# Patient Record
Sex: Male | Born: 1950 | Race: White | Hispanic: No | Marital: Married | Smoking: Former smoker
Health system: Southern US, Community
[De-identification: ages and names within clinical notes are randomized; demographics above are authoritative.]

## PROBLEM LIST (undated history)

## (undated) DIAGNOSIS — I10 Essential (primary) hypertension: Secondary | ICD-10-CM

## (undated) DIAGNOSIS — E785 Hyperlipidemia, unspecified: Secondary | ICD-10-CM

## (undated) DIAGNOSIS — E119 Type 2 diabetes mellitus without complications: Secondary | ICD-10-CM

## (undated) DIAGNOSIS — K4090 Unilateral inguinal hernia, without obstruction or gangrene, not specified as recurrent: Secondary | ICD-10-CM

## (undated) DIAGNOSIS — K5790 Diverticulosis of intestine, part unspecified, without perforation or abscess without bleeding: Secondary | ICD-10-CM

## (undated) DIAGNOSIS — I519 Heart disease, unspecified: Secondary | ICD-10-CM

## (undated) DIAGNOSIS — K429 Umbilical hernia without obstruction or gangrene: Secondary | ICD-10-CM

## (undated) HISTORY — DX: Umbilical hernia without obstruction or gangrene: K42.9

## (undated) HISTORY — DX: Hyperlipidemia, unspecified: E78.5

## (undated) HISTORY — DX: Type 2 diabetes mellitus without complications: E11.9

## (undated) HISTORY — DX: Heart disease, unspecified: I51.9

## (undated) HISTORY — PX: HERNIA REPAIR: SHX51

## (undated) HISTORY — DX: Diverticulosis of intestine, part unspecified, without perforation or abscess without bleeding: K57.90

## (undated) HISTORY — DX: Unilateral inguinal hernia, without obstruction or gangrene, not specified as recurrent: K40.90

## (undated) HISTORY — PX: TONSILLECTOMY: SUR1361

## (undated) HISTORY — DX: Essential (primary) hypertension: I10

---

## 2002-10-24 DIAGNOSIS — K5790 Diverticulosis of intestine, part unspecified, without perforation or abscess without bleeding: Secondary | ICD-10-CM

## 2002-10-24 HISTORY — PX: COLONOSCOPY: SHX174

## 2002-10-24 HISTORY — DX: Diverticulosis of intestine, part unspecified, without perforation or abscess without bleeding: K57.90

## 2007-02-08 DIAGNOSIS — I10 Essential (primary) hypertension: Secondary | ICD-10-CM | POA: Insufficient documentation

## 2007-02-08 DIAGNOSIS — M545 Low back pain, unspecified: Secondary | ICD-10-CM | POA: Insufficient documentation

## 2007-08-17 ENCOUNTER — Ambulatory Visit: Payer: Self-pay | Admitting: Family Medicine

## 2007-08-29 ENCOUNTER — Ambulatory Visit: Payer: Self-pay | Admitting: Family Medicine

## 2009-05-13 ENCOUNTER — Ambulatory Visit: Payer: Self-pay | Admitting: Family Medicine

## 2010-12-27 ENCOUNTER — Ambulatory Visit: Payer: Self-pay | Admitting: Family Medicine

## 2011-01-23 ENCOUNTER — Ambulatory Visit: Payer: Self-pay | Admitting: Family Medicine

## 2011-02-22 ENCOUNTER — Ambulatory Visit: Payer: Self-pay | Admitting: Family Medicine

## 2013-10-24 DIAGNOSIS — K429 Umbilical hernia without obstruction or gangrene: Secondary | ICD-10-CM

## 2013-10-24 DIAGNOSIS — K4090 Unilateral inguinal hernia, without obstruction or gangrene, not specified as recurrent: Secondary | ICD-10-CM

## 2013-10-24 HISTORY — DX: Umbilical hernia without obstruction or gangrene: K42.9

## 2013-10-24 HISTORY — DX: Unilateral inguinal hernia, without obstruction or gangrene, not specified as recurrent: K40.90

## 2013-11-26 ENCOUNTER — Encounter: Payer: Self-pay | Admitting: *Deleted

## 2013-12-03 ENCOUNTER — Other Ambulatory Visit: Payer: Self-pay | Admitting: *Deleted

## 2013-12-03 ENCOUNTER — Ambulatory Visit (INDEPENDENT_AMBULATORY_CARE_PROVIDER_SITE_OTHER): Payer: PRIVATE HEALTH INSURANCE | Admitting: General Surgery

## 2013-12-03 ENCOUNTER — Encounter: Payer: Self-pay | Admitting: General Surgery

## 2013-12-03 ENCOUNTER — Encounter: Payer: Self-pay | Admitting: *Deleted

## 2013-12-03 VITALS — BP 130/78 | HR 80 | Resp 12 | Ht 68.0 in | Wt 195.0 lb

## 2013-12-03 DIAGNOSIS — Z1211 Encounter for screening for malignant neoplasm of colon: Secondary | ICD-10-CM

## 2013-12-03 DIAGNOSIS — K429 Umbilical hernia without obstruction or gangrene: Secondary | ICD-10-CM | POA: Insufficient documentation

## 2013-12-03 DIAGNOSIS — K4090 Unilateral inguinal hernia, without obstruction or gangrene, not specified as recurrent: Secondary | ICD-10-CM | POA: Insufficient documentation

## 2013-12-03 MED ORDER — POLYETHYLENE GLYCOL 3350 17 GM/SCOOP PO POWD: ORAL | Status: DC

## 2013-12-03 NOTE — Progress Notes (Signed)
Patient ID: Maurice Fischer, male   DOB: [birth year], 63 y.o.   MRN: [id number]  Patient's colonoscopy has been scheduled for 01-07-14 at [facility]. This patient has been asked to discontinue fish oil one week prior to procedure. He has also been asked to hold metformin day of colonoscopy prep and procedure. It is okay for patient to continue 81 mg aspirin.   This patient was instructed to call the office should he have further questions.   Miralax prescription had been sent to the patient's pharmacy.

## 2013-12-03 NOTE — Patient Instructions (Addendum)
Patient to be scheduled for a screening colonoscopy. Patient will contact our office when he is ready to schedule his hernia repairs.   Colonoscopy A colonoscopy is an exam to look at the entire large intestine (colon). This exam can help find problems such as tumors, polyps, inflammation, and areas of bleeding. The exam takes about 1 hour.  LET [facility] CARE PROVIDER KNOW ABOUT:   Any allergies you have.  All medicines you are taking, including vitamins, herbs, eye drops, creams, and over-the-counter medicines.  Previous problems you or members of your family have had with the use of anesthetics.  Any blood disorders you have.  Previous surgeries you have had.  Medical conditions you have. RISKS AND COMPLICATIONS  Generally, this is a safe procedure. However, as with any procedure, complications can occur. Possible complications include:  Bleeding.  Tearing or rupture of the colon wall.  Reaction to medicines given during the exam.  Infection (rare). BEFORE THE PROCEDURE   Ask your health care provider about changing or stopping your regular medicines.  You may be prescribed an oral bowel prep. This involves drinking a large amount of medicated liquid, starting the day before your procedure. The liquid will cause you to have multiple loose stools until your stool is almost clear or light green. This cleans out your colon in preparation for the procedure.  Do not eat or drink anything else once you have started the bowel prep, unless your health care provider tells you it is safe to do so.  Arrange for someone to drive you home after the procedure. PROCEDURE   You will be given medicine to help you relax (sedative).  You will lie on your side with your knees bent.  A long, flexible tube with a light and camera on the end (colonoscope) will be inserted through the rectum and into the colon. The camera sends video back to a computer screen as it moves through the colon. The  colonoscope also releases carbon dioxide gas to inflate the colon. This helps your health care provider see the area better.  During the exam, your health care provider may take a small tissue sample (biopsy) to be examined under a microscope if any abnormalities are found.  The exam is finished when the entire colon has been viewed. AFTER THE PROCEDURE   Do not drive for 24 hours after the exam.  You may have a small amount of blood in your stool.  You may pass moderate amounts of gas and have mild abdominal cramping or bloating. This is caused by the gas used to inflate your colon during the exam.  Ask when your test results will be ready and how you will get your results. Make sure you get your test results. Document Released: 10/07/2000 Document Revised: 07/31/2013 Document Reviewed: 06/17/2013 [facility] Patient Information 2014 [place]. Hernia A hernia occurs when an internal organ pushes out through a weak spot in the abdominal wall. Hernias most commonly occur in the groin and around the navel. Hernias often can be pushed back into place (reduced). Most hernias tend to get worse over time. Some abdominal hernias can get stuck in the opening (irreducible or incarcerated hernia) and cannot be reduced. An irreducible abdominal hernia which is tightly squeezed into the opening is at risk for impaired blood supply (strangulated hernia). A strangulated hernia is a medical emergency. Because of the risk for an irreducible or strangulated hernia, surgery may be recommended to repair a hernia. CAUSES  Heavy lifting.  Prolonged coughing. Straining to have a bowel movement. A cut (incision) made during an abdominal surgery. HOME CARE INSTRUCTIONS  Bed rest is not required. You may continue your normal activities. Avoid lifting more than 10 pounds (4.5 kg) or straining. Cough gently. If you are a smoker it is best to stop. Even the best hernia repair can break down with the continual  strain of coughing. Even if you do not have your hernia repaired, a cough will continue to aggravate the problem. Do not wear anything tight over your hernia. Do not try to keep it in with an outside bandage or truss. These can damage abdominal contents if they are trapped within the hernia sac. Eat a normal diet. Avoid constipation. Straining over long periods of time will increase hernia size and encourage breakdown of repairs. If you cannot do this with diet alone, stool softeners may be used. SEEK IMMEDIATE MEDICAL CARE IF:  You have a fever. You develop increasing abdominal pain. You feel nauseous or vomit. Your hernia is stuck outside the abdomen, looks discolored, feels hard, or is tender. You have any changes in your bowel habits or in the hernia that are unusual for you. You have increased pain or swelling around the hernia. You cannot push the hernia back in place by applying gentle pressure while lying down. MAKE SURE YOU:  Understand these instructions. Will watch your condition. Will get help right away if you are not doing well or get worse. Document Released: 10/10/2005 Document Revised: 01/02/2012 Document Reviewed: 05/29/2008 [facility] Patient Information 2014 [place].

## 2013-12-03 NOTE — Progress Notes (Signed)
Patient ID: Maurice Fischer, male   DOB: May 24, 1951, 63 y.o.   MRN: [id number]  Chief Complaint  Patient presents with  . Other    Evaluation of screening colonoscopy    HPI Maurice Fischer is a 63 y.o. male who presents for an evaluation of a screening colonoscopy. The patient states he was reported to have had blood in his stool at his last visit to his doctors office. He believes this was due to something he had eaten. No complaints with his bowels at this time. The patient states he has had a colonoscopy in 2004 showing diverticulosis. He also has noted a hernia in right groin and umbilicus. HPI  Past Medical History  Diagnosis Date  . Diabetes mellitus without complication   . Hyperlipidemia   . Heart disease   . Diverticulosis 2004  . Hypertension     Past Surgical History  Procedure Laterality Date  . Hernia repair  1959?  Maurice Fischer Tonsillectomy  1957?  Maurice Fischer Colonoscopy  2004    Family History  Problem Relation Age of Onset  . Cancer Father     prostate    Social History History  Substance Use Topics  . Smoking status: Former Smoker    Types: Cigarettes  . Smokeless tobacco: Never Used  . Alcohol Use: Yes    No Known Allergies  Current Outpatient Prescriptions  Medication Sig Dispense Refill  . aspirin 81 MG tablet Take 81 mg by mouth daily.      Maurice Fischer atenolol (TENORMIN) 50 MG tablet Take 50 mg by mouth daily.      Maurice Fischer atorvastatin (LIPITOR) 40 MG tablet Take 40 mg by mouth daily.      . Cholecalciferol (VITAMIN D3) 2000 UNITS TABS Take 1 tablet by mouth 2 (two) times daily.      . Coenzyme Q10 (CO Q 10 PO) Take 50 mg by mouth daily.      . fenofibrate (TRICOR) 145 MG tablet Take 145 mg by mouth daily.      . metFORMIN (GLUCOPHAGE) 1000 MG tablet Take 1,000 mg by mouth 2 (two) times daily with a meal.      . Multiple Vitamin (MULTIVITAMIN) tablet Take 1 tablet by mouth daily.      . Omega-3 Fatty Acids (FISH OIL) 1200 MG CAPS Take 1 capsule by mouth 4 (four) times  daily.      . quinapril (ACCUPRIL) 20 MG tablet Take 20 mg by mouth daily.       No current facility-administered medications for this visit.    Review of Systems Review of Systems  Constitutional: Negative.   Respiratory: Negative.   Cardiovascular: Negative.   Gastrointestinal: Negative.     Blood pressure 130/78, pulse 80, resp. rate 12, height 5\' 8"  (1.727 m), weight 195 lb (88.451 kg).  Physical Exam Physical Exam  Constitutional: He is oriented to person, place, and time. He appears well-developed and well-nourished.  Neck: Neck supple. No thyromegaly present.  Cardiovascular: Normal rate, regular rhythm and normal heart sounds.   No murmur heard. Pulmonary/Chest: Effort normal and breath sounds normal.  Abdominal: Soft. Normal appearance and bowel sounds are normal. There is no hepatosplenomegaly. A hernia (small umbilical hernia with mild tenderness) is present. Hernia confirmed positive in the right inguinal area (medium in size reducible. ).  Lymphadenopathy:    He has no cervical adenopathy.  Neurological: He is alert and oriented to person, place, and time.  Skin: Skin is warm and dry.  Data Reviewed     Assessment    Needs screening colonoscopy. Also has umbilical and right ing hernias.     Plan    Discussed colonoscopy, lap repair of hernias. He is agreeable.        Maurice Fischer 12/03/2013, 11:10 AM

## 2013-12-12 ENCOUNTER — Encounter: Payer: Self-pay | Admitting: *Deleted

## 2013-12-12 NOTE — Progress Notes (Signed)
Patient ID: Maurice Fischer, male   DOB: August 15, 1951, 63 y.o.   MRN: [id number]  Patient stopped by the office wanting to go ahead and arrange hernia surgery. This patient's surgery has been scheduled for 01-13-14 at [facility]. It is okay for patient to continue 81 mg aspirin. He is aware of instructions and will call the office if he has further questions.

## 2014-01-02 ENCOUNTER — Other Ambulatory Visit: Payer: Self-pay | Admitting: General Surgery

## 2014-01-02 DIAGNOSIS — K4090 Unilateral inguinal hernia, without obstruction or gangrene, not specified as recurrent: Secondary | ICD-10-CM

## 2014-01-02 DIAGNOSIS — Z1211 Encounter for screening for malignant neoplasm of colon: Secondary | ICD-10-CM

## 2014-01-06 ENCOUNTER — Ambulatory Visit: Payer: Self-pay | Admitting: General Surgery

## 2014-01-06 LAB — CBC WITH DIFFERENTIAL/PLATELET
BASOS PCT: 0.8 %
Basophil #: 0.1 10*3/uL (ref 0.0–0.1)
EOS PCT: 3.4 %
Eosinophil #: 0.3 10*3/uL (ref 0.0–0.7)
HCT: 43.7 % (ref 40.0–52.0)
HGB: 14.2 g/dL (ref 13.0–18.0)
LYMPHS PCT: 34.4 %
Lymphocyte #: 3.2 10*3/uL (ref 1.0–3.6)
MCH: 28.7 pg (ref 26.0–34.0)
MCHC: 32.5 g/dL (ref 32.0–36.0)
MCV: 88 fL (ref 80–100)
MONO ABS: 0.6 x10 3/mm (ref 0.2–1.0)
MONOS PCT: 6.6 %
NEUTROS ABS: 5.1 10*3/uL (ref 1.4–6.5)
Neutrophil %: 54.8 %
PLATELETS: 242 10*3/uL (ref 150–440)
RBC: 4.95 10*6/uL (ref 4.40–5.90)
RDW: 12.7 % (ref 11.5–14.5)
WBC: 9.3 10*3/uL (ref 3.8–10.6)

## 2014-01-06 LAB — BASIC METABOLIC PANEL
Anion Gap: 2 — ABNORMAL LOW (ref 7–16)
BUN: 17 mg/dL (ref 7–18)
CALCIUM: 9.3 mg/dL (ref 8.5–10.1)
CO2: 31 mmol/L (ref 21–32)
Chloride: 103 mmol/L (ref 98–107)
Creatinine: 0.74 mg/dL (ref 0.60–1.30)
EGFR (African American): >60
EGFR (Non-African Amer.): >60
GLUCOSE: 121 mg/dL — AB (ref 65–99)
OSMOLALITY: 275 (ref 275–301)
POTASSIUM: 4.1 mmol/L (ref 3.5–5.1)
Sodium: 136 mmol/L (ref 136–145)

## 2014-01-07 ENCOUNTER — Ambulatory Visit: Payer: Self-pay | Admitting: General Surgery

## 2014-01-07 DIAGNOSIS — Z1211 Encounter for screening for malignant neoplasm of colon: Secondary | ICD-10-CM

## 2014-01-08 ENCOUNTER — Encounter: Payer: Self-pay | Admitting: General Surgery

## 2014-01-09 ENCOUNTER — Encounter: Payer: Self-pay | Admitting: General Surgery

## 2014-01-13 ENCOUNTER — Ambulatory Visit: Payer: Self-pay | Admitting: General Surgery

## 2014-01-13 DIAGNOSIS — K4090 Unilateral inguinal hernia, without obstruction or gangrene, not specified as recurrent: Secondary | ICD-10-CM

## 2014-01-13 DIAGNOSIS — K429 Umbilical hernia without obstruction or gangrene: Secondary | ICD-10-CM

## 2014-01-13 HISTORY — PX: HERNIA REPAIR: SHX51

## 2014-01-14 ENCOUNTER — Encounter: Payer: Self-pay | Admitting: General Surgery

## 2014-01-16 ENCOUNTER — Telehealth: Payer: Self-pay | Admitting: *Deleted

## 2014-01-16 NOTE — Telephone Encounter (Signed)
Wife called and states that he was having several loose stools a day. I asked how much colace he was taking and she said about 2-3 a day. Advised to reduce to one a day and if he is not taking a lot of the pain medications he may not even need the colace. Advised to use Imodium sparingly as it may constipate him, wife agrees.

## 2014-01-22 ENCOUNTER — Encounter: Payer: Self-pay | Admitting: General Surgery

## 2014-01-22 ENCOUNTER — Ambulatory Visit (INDEPENDENT_AMBULATORY_CARE_PROVIDER_SITE_OTHER): Payer: Self-pay | Admitting: General Surgery

## 2014-01-22 VITALS — BP 130/72 | HR 70 | Resp 12 | Ht 68.0 in | Wt 190.0 lb

## 2014-01-22 DIAGNOSIS — K429 Umbilical hernia without obstruction or gangrene: Secondary | ICD-10-CM

## 2014-01-22 DIAGNOSIS — K4090 Unilateral inguinal hernia, without obstruction or gangrene, not specified as recurrent: Secondary | ICD-10-CM

## 2014-01-22 NOTE — Progress Notes (Signed)
Patient ID: Maurice Fischer, male   DOB: [birth year], 63 y.o.   MRN: [id number]  The patient presents for a post op lap right inguinal hernia and umbilical hernia repair. The procedure was performed on 01/13/14. The patient denies any problems at this time. Doing well.   Port sites healing well. No signs of infection. Hernia repairs intact. Abdomen is soft and nontender.

## 2014-01-22 NOTE — Patient Instructions (Addendum)
Patient to return in 1 month for follow up. The patient is aware to call back for any questions or concerns. Patient can increase activity as tolerated starting next week.

## 2014-02-24 ENCOUNTER — Encounter: Payer: Self-pay | Admitting: General Surgery

## 2014-02-24 ENCOUNTER — Ambulatory Visit (INDEPENDENT_AMBULATORY_CARE_PROVIDER_SITE_OTHER): Payer: Self-pay | Admitting: General Surgery

## 2014-02-24 VITALS — BP 120/70 | HR 78 | Resp 12 | Ht 68.0 in | Wt 189.0 lb

## 2014-02-24 DIAGNOSIS — K4090 Unilateral inguinal hernia, without obstruction or gangrene, not specified as recurrent: Secondary | ICD-10-CM

## 2014-02-24 DIAGNOSIS — K429 Umbilical hernia without obstruction or gangrene: Secondary | ICD-10-CM

## 2014-02-24 NOTE — Patient Instructions (Addendum)
Patient to return as needed. No restrictions of activity

## 2014-02-24 NOTE — Progress Notes (Signed)
The patient presents for a post op lap right inguinal hernia and umbilical hernia repair. The procedure was performed on 01/13/14. The patient denies any problems at this time. Doing well.  Port sites well healed. No signs of infection. Hernia repairs intact. Abdomen is soft and nontender

## 2014-02-25 ENCOUNTER — Encounter: Payer: Self-pay | Admitting: General Surgery

## 2015-02-14 NOTE — Op Note (Signed)
PATIENT NAME:  Maurice Fischer, Maurice Fischer MR#:  [id number] DATE OF BIRTH:  04-02-1951  DATE OF PROCEDURE:  01/13/2014  PREOPERATIVE DIAGNOSES: 1.  Right inguinal hernia.  2.  Small umbilical hernia.   POSTOPERATIVE DIAGNOSES:    1.  Right inguinal hernia.  2.  Small umbilical hernia.   PROCEDURE PERFORMED: Laparoscopy and repair of right inguinal hernia and repair of umbilical hernia.   SURGEON: Mckinley Jewel, M.D.   ANESTHESIA: General.   COMPLICATIONS: None.   ESTIMATED BLOOD LOSS: Minimal.   DRAINS: None.   DESCRIPTION OF PROCEDURE: The patient was put to sleep in the supine position on the operating table. Foley catheter was inserted which was removed at the end of the procedure. The abdomen was prepped and draped out as a sterile field. Timeout was performed. The umbilical hernia was located at the upper lip of the umbilicus. Small transverse incision was made at this site and preperitoneal fatty tissue protruding right under the skin was identified. This was easily dissected down to the fascial opening. It was just a little less than a fingerbreadth in size. The preperitoneal tissue was excised out and the peritoneal cavity was easily entered into through this fascial opening. A 12 mm XL port was placed and pneumoperitoneum was obtained. The camera was introduced and subsequent right and left 5 mm ports were placed in the flank areas. The inguinal region was fairly tight and it was noted there was some softening of indirect sac with no contents going into it at this time. The peritoneum overlying the inguinal canal was incised using scissors and cautery and then dissected down towards the internal ring area where the sac was noted. Dissection was then performed in a somewhat tedious fashion because the patient had a very large lipoma attached to the sac which had to be freed until the cord structures could be visualized. After the lipoma was satisfactorily freed, it was taken down with Echelon  stapling device and removed. The sac had been adequately dissected off from the internal ring area. The inguinal ligament lateral to the internal ring and medial and pubic tubercle were all well outlined. After the posterior wall was adequately exposed, a 3D Bard lightweight mesh was introduced into the peritoneal cavity, it laid up against the posterior wall of the inguinal canal. The SecureStrap tacker was used to tack it down on the medial aspect, and 1 medial and 1 lateral to the internal ring area. The peritoneal surface was then reapproximated also with the [place]. Following this, the umbilical ports were removed, and using a suture passer, 2 sutures of 0 Prolene were used to close the fascial defect. Pneumoperitoneum was released and the remaining ports were removed. The skin incision was closed with subcuticular 4-0 Vicryl reinforced with Dermabond. The procedure was well tolerated. The patient subsequently was extubated and returned to the recovery room in stable condition.    ____________________________ S.Robinette Haines, MD sgs:dmm D: 01/13/2014 10:24:00 ET T: 01/13/2014 10:47:21 ET JOB#: [id number]  cc: Synthia Innocent. Jamal Collin, MD, <Dictator> [facility] Robinette Haines MD ELECTRONICALLY SIGNED 01/15/2014 8:09

## 2015-05-28 ENCOUNTER — Ambulatory Visit (INDEPENDENT_AMBULATORY_CARE_PROVIDER_SITE_OTHER): Payer: 59 | Admitting: Family Medicine

## 2015-05-28 ENCOUNTER — Encounter (INDEPENDENT_AMBULATORY_CARE_PROVIDER_SITE_OTHER): Payer: Self-pay

## 2015-05-28 ENCOUNTER — Encounter: Payer: Self-pay | Admitting: Family Medicine

## 2015-05-28 VITALS — BP 120/74 | HR 74 | Temp 97.9°F | Resp 16 | Ht 68.0 in | Wt 190.4 lb

## 2015-05-28 DIAGNOSIS — I2510 Atherosclerotic heart disease of native coronary artery without angina pectoris: Secondary | ICD-10-CM | POA: Insufficient documentation

## 2015-05-28 DIAGNOSIS — L259 Unspecified contact dermatitis, unspecified cause: Secondary | ICD-10-CM

## 2015-05-28 DIAGNOSIS — K429 Umbilical hernia without obstruction or gangrene: Secondary | ICD-10-CM | POA: Insufficient documentation

## 2015-05-28 DIAGNOSIS — IMO0002 Reserved for concepts with insufficient information to code with codable children: Secondary | ICD-10-CM | POA: Insufficient documentation

## 2015-05-28 DIAGNOSIS — E1165 Type 2 diabetes mellitus with hyperglycemia: Secondary | ICD-10-CM | POA: Insufficient documentation

## 2015-05-28 DIAGNOSIS — J301 Allergic rhinitis due to pollen: Secondary | ICD-10-CM | POA: Insufficient documentation

## 2015-05-28 DIAGNOSIS — K4090 Unilateral inguinal hernia, without obstruction or gangrene, not specified as recurrent: Secondary | ICD-10-CM | POA: Insufficient documentation

## 2015-05-28 DIAGNOSIS — I10 Essential (primary) hypertension: Secondary | ICD-10-CM | POA: Insufficient documentation

## 2015-05-28 DIAGNOSIS — E781 Pure hyperglyceridemia: Secondary | ICD-10-CM | POA: Insufficient documentation

## 2015-05-28 MED ORDER — PREDNISONE 10 MG (21) PO TBPK: ORAL_TABLET | ORAL | Status: DC

## 2015-05-28 NOTE — Progress Notes (Signed)
Name: Maurice Fischer   MRN: [id number]    DOB: 1950/12/08   Date:05/28/2015       Progress Note  Subjective  Chief Complaint  Chief Complaint  Patient presents with  . Poison Ivy    left back of leg onset 1 week while playing golf    HPI  Rash: Patient complains of rash involving the left lower leg. Rash started 1 week ago. Appearance of rash at onset: Texture of lesion(s): blistering. Rash has not changed over time Initial distribution: left lower leg.  Discomfort associated with rash: is pruritic.  Associated symptoms: none. Denies: abdominal pain, arthralgia, decrease in appetite, decrease in energy level, fever, myalgia and nausea. Patient has had previous evaluation of rash. Patient has had previous treatment.  Response to treatment: at least once a year has contact dermatitis from a plant while he is out golfing and topical or oral prednisone has worked in the past. Patient has not had contacts with similar rash. Patient has identified precipitant. Patient has not had new exposures (soaps, lotions, laundry detergents, foods, medications, plants, insects or animals.)    Patient Active Problem List   Diagnosis Date Noted  . 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 CD (contact dermatitis) 05/28/2015  . Arteriosclerosis of coronary artery 05/28/2015  . Diabetes mellitus type 2, uncontrolled 05/28/2015  . HLD (hyperlipidemia) 05/28/2015  . BP (high blood pressure) 05/28/2015  . Inguinal hernia 05/28/2015  . Umbilical hernia 29/52/8413  . Atherosclerosis of coronary artery 02/08/2007  . Benign essential HTN 02/08/2007  . LBP (low back pain) 02/08/2007    History  Substance Use Topics  . Smoking status: Former Smoker -- 25 years    Types: Cigarettes    Quit date: 10/08/1992  . Smokeless tobacco: Never Used  . Alcohol Use: 0.0 oz/week    0 Standard drinks or equivalent per week     Current outpatient prescriptions:  .  aspirin 81 MG tablet, Take 81 mg by mouth daily., Disp: , Rfl:  .   atenolol (TENORMIN) 50 MG tablet, Take 50 mg by mouth daily., Disp: , Rfl:  .  atorvastatin (LIPITOR) 40 MG tablet, Take 40 mg by mouth daily., Disp: , Rfl:  .  Cholecalciferol (VITAMIN D3) 2000 UNITS TABS, Take 1 tablet by mouth 2 (two) times daily., Disp: , Rfl:  .  fenofibrate (TRICOR) 145 MG tablet, Take 145 mg by mouth daily., Disp: , Rfl:  .  metFORMIN (GLUCOPHAGE) 1000 MG tablet, Take 1,000 mg by mouth 2 (two) times daily with a meal., Disp: , Rfl:  .  Multiple Vitamin (MULTIVITAMIN) tablet, Take 1 tablet by mouth daily., Disp: , Rfl:  .  Omega-3 Fatty Acids (FISH OIL) 1200 MG CAPS, Take 1 capsule by mouth 4 (four) times daily., Disp: , Rfl:  .  polyethylene glycol powder (GLYCOLAX/MIRALAX) powder, 255 grams one bottle for colonoscopy prep, Disp: 255 g, Rfl: 0 .  predniSONE (STERAPRED UNI-PAK 21 TAB) 10 MG (21) TBPK tablet, Use as directed in a 6 day taper PredPak, Disp: 21 tablet, Rfl: 0 .  quinapril (ACCUPRIL) 20 MG tablet, Take 20 mg by mouth daily., Disp: , Rfl:   No Known Allergies  Review of Systems  Ten systems reviewed and is negative except as mentioned in HPI.  Objective  BP 120/74 mmHg  Pulse 74  Temp(Src) 97.9 F (36.6 C) (Oral)  Resp 16  Ht 5\' 8"  (1.727 m)  Wt 190 lb 6.4 oz (86.365 kg)  BMI 28.96 kg/m2  SpO2 96%  Body mass index is 28.96 kg/(m^2).   Physical Exam  Constitutional: Patient appears well-developed and well-nourished. In no distress.  Cardiovascular: Normal rate, regular rhythm and normal heart sounds.  No murmur heard.  Pulmonary/Chest: Effort normal and breath sounds normal. No respiratory distress. Musculoskeletal: Normal range of motion bilateral UE and LE, no joint effusions. Peripheral vascular: Bilateral LE no edema. Neurological: CN II-XII grossly intact with no focal deficits. Alert and oriented to person, place, and time. Coordination, balance, strength, speech and gait are normal.  Skin: Skin is warm and dry. Left LE at the calf area  with raised red blisters in clusters and linear distribution. Not in a dermatomal pattern.  Psychiatric: Patient has a normal mood and affect. Behavior is normal in office today. Judgment and thought content normal in office today.    Assessment & Plan  1. CD (contact dermatitis) Poison Ivy or other plan based contact dermatitis. Topical OTC steroid cream, calamine lotion as needed and start prednisone taper pack if rash spreads or worsens but reminded patient to keep in mind blood glucose may go up.  - predniSONE (STERAPRED UNI-PAK 21 TAB) 10 MG (21) TBPK tablet; Use as directed in a 6 day taper PredPak  Dispense: 21 tablet; Refill: 0

## 2015-05-28 NOTE — Patient Instructions (Signed)

## 2015-07-13 ENCOUNTER — Ambulatory Visit: Payer: Self-pay | Admitting: Family Medicine

## 2015-07-16 ENCOUNTER — Ambulatory Visit: Payer: Self-pay | Admitting: Family Medicine

## 2015-07-22 ENCOUNTER — Ambulatory Visit (INDEPENDENT_AMBULATORY_CARE_PROVIDER_SITE_OTHER): Payer: 59 | Admitting: Family Medicine

## 2015-07-22 ENCOUNTER — Encounter: Payer: Self-pay | Admitting: Family Medicine

## 2015-07-22 VITALS — BP 128/68 | HR 79 | Temp 98.8°F | Resp 18 | Ht 68.0 in | Wt 189.5 lb

## 2015-07-22 DIAGNOSIS — Z23 Encounter for immunization: Secondary | ICD-10-CM | POA: Diagnosis not present

## 2015-07-22 DIAGNOSIS — E1165 Type 2 diabetes mellitus with hyperglycemia: Secondary | ICD-10-CM | POA: Diagnosis not present

## 2015-07-22 DIAGNOSIS — I2510 Atherosclerotic heart disease of native coronary artery without angina pectoris: Secondary | ICD-10-CM | POA: Diagnosis not present

## 2015-07-22 DIAGNOSIS — IMO0002 Reserved for concepts with insufficient information to code with codable children: Secondary | ICD-10-CM

## 2015-07-22 DIAGNOSIS — E785 Hyperlipidemia, unspecified: Secondary | ICD-10-CM | POA: Diagnosis not present

## 2015-07-22 MED ORDER — ATENOLOL 50 MG PO TABS: 50.0000 mg | ORAL_TABLET | Freq: Every day | ORAL | Status: DC

## 2015-07-22 MED ORDER — ATORVASTATIN CALCIUM 40 MG PO TABS: 40.0000 mg | ORAL_TABLET | Freq: Every day | ORAL | Status: DC

## 2015-07-22 MED ORDER — METFORMIN HCL 1000 MG PO TABS: 1000.0000 mg | ORAL_TABLET | Freq: Two times a day (BID) | ORAL | Status: DC

## 2015-07-22 MED ORDER — FENOFIBRATE 145 MG PO TABS: 145.0000 mg | ORAL_TABLET | Freq: Every day | ORAL | Status: DC

## 2015-07-22 MED ORDER — QUINAPRIL HCL 20 MG PO TABS: 20.0000 mg | ORAL_TABLET | Freq: Every day | ORAL | Status: DC

## 2015-07-22 MED ORDER — CANAGLIFLOZIN 100 MG PO TABS: 100.0000 mg | ORAL_TABLET | Freq: Every day | ORAL | Status: DC

## 2015-07-22 NOTE — Progress Notes (Signed)
Name: Maurice Fischer   MRN: [id number]    DOB: 1951/06/15   Date:07/22/2015       Progress Note  Subjective  Chief Complaint  Chief Complaint  Patient presents with  . Hypertension    4 month follow up  . Diabetes  . Hyperlipidemia    HPI  Hypertension   Patient presents for follow-up of hypertension. It has been present for over over 5 years.  Patient states that there is compliance with medical regimen which consists of quinapril 20 mg daily and atenolol 50 mg daily . There is no end organ disease. Cardiac risk factors include hypertension hyperlipidemia and diabetes.  Exercise regimen consist of some walking and golfing .  Diet consist of non-healthy foods .  Diabetes  Patient presents for follow-up of diabetes which is present for over 5 years. Is currently on a regimen of metformin 1000 mg twice a day . Patient states not very compliant with their diet and exercise. There's been no hypoglycemic episodes and there is no polyuria polydipsia polyphagia. His average fasting glucoses been in the low around mid 100s to upper 100s with a high around upper 100s . There is no end organ disease.  Last diabetic eye exam was earlier this year.   Last visit with dietitian was greater than a year ago 6/13 and was normal at 20. He cannot obtain a urine on today.   Hyperlipidemia  Patient has a history of hyperlipidemia for over 5 years.  Current medical regimen consist of fenofibrate 145 mg daily and atorvastatin 40 mg daily at bedtime .  Compliance is good .  Diet and exercise are currently followed intermittently .  Risk factors for cardiovascular disease include hyperlipidemia , history of prior coronary event, hypertension, diabetes .   There have been no side effects from the medication.    Coronary artery disease  Patient is has a history of coronary artery disease with this occurred many years ago. He has had stable angina with no chest pain or vomiting recent account and has had no  problem with shortness of breath or irregularity of his heartbeat.  Past Medical History  Diagnosis Date  . Diabetes mellitus without complication   . Hyperlipidemia   . Heart disease   . Diverticulosis 2004  . Hypertension   . Umbilical hernia [id number]  . Inguinal hernia 2015    right    Social History  Substance Use Topics  . Smoking status: Former Smoker -- 25 years    Types: Cigarettes    Quit date: 10/08/1992  . Smokeless tobacco: Never Used  . Alcohol Use: 0.0 oz/week    0 Standard drinks or equivalent per week     Current outpatient prescriptions:  .  aspirin 81 MG tablet, Take 81 mg by mouth daily., Disp: , Rfl:  .  atenolol (TENORMIN) 50 MG tablet, Take 1 tablet (50 mg total) by mouth daily., Disp: 90 tablet, Rfl: 1 .  atorvastatin (LIPITOR) 40 MG tablet, Take 1 tablet (40 mg total) by mouth daily., Disp: 90 tablet, Rfl: 1 .  canagliflozin (INVOKANA) 100 MG TABS tablet, Take 1 tablet (100 mg total) by mouth daily., Disp: 30 tablet, Rfl: 5 .  Cholecalciferol (VITAMIN D3) 2000 UNITS TABS, Take 1 tablet by mouth 2 (two) times daily., Disp: , Rfl:  .  fenofibrate (TRICOR) 145 MG tablet, Take 1 tablet (145 mg total) by mouth daily., Disp: 90 tablet, Rfl: 1 .  metFORMIN (GLUCOPHAGE) 1000 MG tablet, Take 1  tablet (1,000 mg total) by mouth 2 (two) times daily with a meal., Disp: 180 tablet, Rfl: 1 .  Multiple Vitamin (MULTIVITAMIN) tablet, Take 1 tablet by mouth daily., Disp: , Rfl:  .  Omega-3 Fatty Acids (FISH OIL) 1200 MG CAPS, Take 1 capsule by mouth 4 (four) times daily., Disp: , Rfl:  .  polyethylene glycol powder (GLYCOLAX/MIRALAX) powder, 255 grams one bottle for colonoscopy prep, Disp: 255 g, Rfl: 0 .  quinapril (ACCUPRIL) 20 MG tablet, Take 1 tablet (20 mg total) by mouth daily., Disp: 90 tablet, Rfl: 1  No Known Allergies  Review of Systems  Constitutional: Negative for fever, chills and weight loss.  HENT: Negative for congestion, hearing loss, sore throat and  tinnitus.   Eyes: Negative for blurred vision, double vision and redness.  Respiratory: Negative for cough, hemoptysis and shortness of breath.   Cardiovascular: Negative for chest pain, palpitations, orthopnea, claudication and leg swelling.  Gastrointestinal: Negative for heartburn, nausea, vomiting, diarrhea, constipation and blood in stool.  Genitourinary: Negative for dysuria, urgency, frequency and hematuria.  Musculoskeletal: Negative for myalgias, back pain, joint pain, falls and neck pain.  Skin: Negative for itching.  Neurological: Negative for dizziness, tingling, tremors, focal weakness, seizures, loss of consciousness, weakness and headaches.  Endo/Heme/Allergies: Does not bruise/bleed easily.  Psychiatric/Behavioral: Negative for depression and substance abuse. The patient is not nervous/anxious and does not have insomnia.      Objective  Filed Vitals:   07/22/15 0917  BP: 128/68  Pulse: 79  Temp: 98.8 F (37.1 C)  TempSrc: Oral  Resp: 18  Height: 5\' 8"  (1.727 m)  Weight: 189 lb 8 oz (85.957 kg)  SpO2: 95%     Physical Exam  Constitutional: He is oriented to person, place, and time and well-developed, well-nourished, and in no distress.  HENT:  Head: Normocephalic.  Eyes: EOM are normal. Pupils are equal, round, and reactive to light.  Neck: Normal range of motion. Neck supple. No thyromegaly present.  Cardiovascular: Normal rate, regular rhythm, normal heart sounds and intact distal pulses.   No murmur heard. Pulmonary/Chest: Effort normal and breath sounds normal. No respiratory distress. He has no wheezes.  Musculoskeletal: Normal range of motion. He exhibits no edema.  Lymphadenopathy:    He has no cervical adenopathy.  Neurological: He is alert and oriented to person, place, and time. No cranial nerve deficit. Gait normal. Coordination normal.  Skin: Skin is warm and dry. No rash noted.  Psychiatric: Affect and judgment normal.      Assessment &  Plan  1. Uncontrolled diabetes mellitus  Add Invokana - POCT HgB A1C - POCT Glucose (CBG) - canagliflozin (INVOKANA) 100 MG TABS tablet; Take 1 tablet (100 mg total) by mouth daily.  Dispense: 30 tablet; Refill: 5 - Fructosamine - Amb ref to Medical Nutrition Therapy-MNT  2. Coronary artery disease involving native coronary artery of native heart without angina pectoris  Stable continue aspirin daily  3. Need for influenza vaccination  Given today  Flu vaccine HIGH DOSE PF (Fluzone High dose)  4. Hyperlipidemia  Lipid panel on return visit

## 2015-07-23 LAB — FRUCTOSAMINE: Fructosamine: 275 umol/L (ref 0–285)

## 2015-08-18 ENCOUNTER — Encounter: Payer: 59 | Attending: Family Medicine | Admitting: Dietician

## 2015-08-18 ENCOUNTER — Encounter: Payer: Self-pay | Admitting: Dietician

## 2015-08-18 VITALS — Ht 68.0 in | Wt 187.3 lb

## 2015-08-18 DIAGNOSIS — E119 Type 2 diabetes mellitus without complications: Secondary | ICD-10-CM

## 2015-08-18 NOTE — Patient Instructions (Signed)
   Continue with healthy food choices and good portion control.  Aim for 45-60g healthy carb with each meal (which is 3-4 servings); include at least 10 servings daily.  If having fruit for a bedtime snack, try including 1/4 cup nuts or 1oz cheese and see if it reduces fasting blood sugars.   Gradually resume physical exercise as able.

## 2015-08-18 NOTE — Progress Notes (Signed)
Medical Nutrition Therapy: Visit start time: 1100  end time: 1145  Assessment:  Diagnosis: Type 2 DM Past medical history: HTN, hyperlipidemia, diverticulosis Psychosocial issues/ stress concerns: patient reports low stress level Preferred learning method:  . No preference indicated  Current weight: 187.3lbs  Height: 5'8" Medications, supplements: reviewed list in chart with patient Progress and evaluation: Patient reports recent increase in HbA1C; states he has not been as consistently monitoring or caring for his Diabetes.         He reports a groin muscle injury 1-2 months ago, and has since stopped exercise.           Physical activity: currently some golf and light walking; was doing cardio and resistance training at gym for 1-2 hours, 2-6 days per week.   Dietary Intake:  Usual eating pattern includes 2-3 meals and 0-2 snacks per day. Dining out frequency: 5 meals per week.  Breakfast: eggs ham and tomatoes 1-2 slices rye toast, cottage cheese banana, muffin (blueberry), coffee 1/2 caff with 1/2 and 1/2, occasionally Tomato juice Snack: usually none, occasionally nuts almonds pist Lunch: some days skips due to lack of hunger -- or fruit/yogurt or carrots, beer when playing golf; or sandwich on whole wheat with chicken, lowfat mayo, carrots, celery, water.  Snack: none usually Supper: 10/24: fajitas with shrimp, beef, veg, salsa, lowfat cream. Often chicken or steak (more than 3oz), salad Snack: apple !/2, eng muffin with peanut butter, or nuts Beverages: water, coffee, lowfat or skim milk, chocolate milk, tomato juice, beer. Usually no soda.   Nutrition Care Education: Topics covered: Diabetes Basic nutrition: appropriate nutrient balance, appropriate meal and snack schedule   Diabetes:  appropriate carb intake and balance, calculated needs at about 1900kcal daily and advised at least 40% CHO (or minimum of 10 servings, 25-30% protein) Other lifestyle changes:  Effects of exercise  on BGs, and possibility of his muscle injury and subsequent halt to exercise affecting his BG control.   Nutritional Diagnosis:  NI-5.8.4 Inconsistent carbohydrate intake As related to lack of meal planning.  As evidenced by patient report.  Intervention: Discussion as noted above.    Advised allowing at least 30g carb. with each meal, ideally 45g, on a consistent basis.    Encouraged resuming exercise as able.   Did not schedule follow-up at this time, but encouraged patient to call after next MD appointment.   Education Materials given:  . Food lists/Carbohydrate Counting book [employer])  Actuary out Guide (BD) . Goals/ instructions   Learner/ who was taught:  . Patient   Level of understanding: [name] Verbalizes/ demonstrates competency  Demonstrated degree of understanding via:   Teach back Learning barriers: . None  Willingness to learn/ readiness for change: . Acceptance, ready for change   Monitoring and Evaluation:  Dietary intake, exercise, BG control, weight      follow up: prn

## 2015-09-16 ENCOUNTER — Ambulatory Visit (INDEPENDENT_AMBULATORY_CARE_PROVIDER_SITE_OTHER): Payer: 59 | Admitting: Family Medicine

## 2015-09-16 ENCOUNTER — Encounter: Payer: Self-pay | Admitting: Family Medicine

## 2015-09-16 VITALS — BP 122/70 | HR 78 | Temp 97.6°F | Resp 16 | Ht 68.0 in | Wt 182.7 lb

## 2015-09-16 DIAGNOSIS — I10 Essential (primary) hypertension: Secondary | ICD-10-CM

## 2015-09-16 DIAGNOSIS — E1169 Type 2 diabetes mellitus with other specified complication: Secondary | ICD-10-CM | POA: Diagnosis not present

## 2015-09-16 DIAGNOSIS — M150 Primary generalized (osteo)arthritis: Secondary | ICD-10-CM

## 2015-09-16 DIAGNOSIS — E119 Type 2 diabetes mellitus without complications: Secondary | ICD-10-CM | POA: Insufficient documentation

## 2015-09-16 DIAGNOSIS — E7800 Pure hypercholesterolemia, unspecified: Secondary | ICD-10-CM | POA: Diagnosis not present

## 2015-09-16 DIAGNOSIS — M5440 Lumbago with sciatica, unspecified side: Secondary | ICD-10-CM | POA: Diagnosis not present

## 2015-09-16 DIAGNOSIS — I2510 Atherosclerotic heart disease of native coronary artery without angina pectoris: Secondary | ICD-10-CM

## 2015-09-16 DIAGNOSIS — M1990 Unspecified osteoarthritis, unspecified site: Secondary | ICD-10-CM | POA: Insufficient documentation

## 2015-09-16 DIAGNOSIS — M159 Polyosteoarthritis, unspecified: Secondary | ICD-10-CM

## 2015-09-16 DIAGNOSIS — E785 Hyperlipidemia, unspecified: Secondary | ICD-10-CM | POA: Insufficient documentation

## 2015-09-16 LAB — GLUCOSE, POCT (MANUAL RESULT ENTRY): POC Glucose: 152 mg/dl — AB (ref 70–99)

## 2015-09-16 LAB — POCT GLYCOSYLATED HEMOGLOBIN (HGB A1C): Hemoglobin A1C: 7.6

## 2015-09-16 MED ORDER — MELOXICAM 15 MG PO TABS: 15.0000 mg | ORAL_TABLET | Freq: Every day | ORAL | Status: DC

## 2015-09-16 NOTE — Progress Notes (Signed)
Name: Maurice Fischer   MRN: [id number]    DOB: 1951/04/26   Date:09/16/2015       Progress Note  Subjective  Chief Complaint  Chief Complaint  Patient presents with  . Hypertension    4 month recheck  . Diabetes  . Hyperlipidemia  . Shoulder Pain    bilateral for 2 weeks    Hypertension Pertinent negatives include no blurred vision, chest pain, headaches, neck pain, orthopnea, palpitations or shortness of breath.  Diabetes Pertinent negatives for hypoglycemia include no dizziness, headaches, nervousness/anxiousness, seizures or tremors. Pertinent negatives for diabetes include no blurred vision, no chest pain, no weakness and no weight loss.  Hyperlipidemia Pertinent negatives include no chest pain, focal weakness, myalgias or shortness of breath.  Shoulder Pain  Pertinent negatives include no fever, itching or tingling.  Diabetes  Patient presents for follow-up of diabetes which is present for over 5 years. Is currently on a regimen of metformin 1000 mg daily and Invokana. Patient states some compliance with their diet and exercise. There's been no hypoglycemic episodes and there is no polyuria polydipsia polyphagia. His average fasting glucoses been in the low around - with a high around - . There is no end organ disease.  Last diabetic eye exam was earlier this year.   Last visit with dietitian was greater than a year ago. Last microalbumin was unobtainable again  Hypertension   Patient presents for follow-up of hypertension. It has been present for over 5 years.  Patient states that there is compliance with medical regimen which consists of Accupril 20 mg daily and atenolol 50 mg daily . There is no end organ disease. Cardiac risk factors include hypertension hyperlipidemia and diabetes.  Exercise regimen consist of walking and golfing .  Diet consist of low-sodium  Hyperlipidemia  Patient has a history of hyperlipidemia for over 5 years.  Current medical regimen consist of  TriCor 145 once daily and atorvastatin 40 mg daily .  Compliance is good .  Diet and exercise are currently followed fairly well .  Risk factors for cardiovascular disease include hyperlipidemia history of prior coronary artery disease and hypertension and diabetes .   There have been no side effects from the medication.  Osteoarthritis  Patient complains of bilateral shoulder pain for the last several weeks.   . As well as bilateral hip pain for several months. Shoulders worse on the right with some mild limitation in abduction and some mild crepitus. The left hip discomfort is more severe than the right. There is no history of traumatic injury but he has a lot of very physical activities and sports over the years. .     Past Medical History  Diagnosis Date  . Diabetes mellitus without complication ([place])   . Hyperlipidemia   . Heart disease   . Diverticulosis 2004  . Hypertension   . Umbilical hernia [id number]  . Inguinal hernia 2015    right    Social History  Substance Use Topics  . Smoking status: Former Smoker -- 25 years    Types: Cigarettes    Quit date: 10/08/1992  . Smokeless tobacco: Never Used  . Alcohol Use: 4.2 oz/week    7 Standard drinks or equivalent per week     Current outpatient prescriptions:  .  aspirin 81 MG tablet, Take 81 mg by mouth daily., Disp: , Rfl:  .  atenolol (TENORMIN) 50 MG tablet, Take 1 tablet (50 mg total) by mouth daily., Disp: 90 tablet, Rfl: 1 .  atorvastatin (LIPITOR) 40 MG tablet, Take 1 tablet (40 mg total) by mouth daily., Disp: 90 tablet, Rfl: 1 .  canagliflozin (INVOKANA) 100 MG TABS tablet, Take 1 tablet (100 mg total) by mouth daily., Disp: 30 tablet, Rfl: 5 .  Cholecalciferol (VITAMIN D3) 2000 UNITS TABS, Take 1 tablet by mouth 2 (two) times daily., Disp: , Rfl:  .  fenofibrate (TRICOR) 145 MG tablet, Take 1 tablet (145 mg total) by mouth daily., Disp: 90 tablet, Rfl: 1 .  metFORMIN (GLUCOPHAGE) 1000 MG tablet, Take 1 tablet (1,000 mg  total) by mouth 2 (two) times daily with a meal., Disp: 180 tablet, Rfl: 1 .  Multiple Vitamin (MULTIVITAMIN) tablet, Take 1 tablet by mouth daily., Disp: , Rfl:  .  Omega-3 Fatty Acids (FISH OIL) 1200 MG CAPS, Take 1 capsule by mouth 4 (four) times daily., Disp: , Rfl:  .  polyethylene glycol powder (GLYCOLAX/MIRALAX) powder, 255 grams one bottle for colonoscopy prep, Disp: 255 g, Rfl: 0 .  quinapril (ACCUPRIL) 20 MG tablet, Take 1 tablet (20 mg total) by mouth daily., Disp: 90 tablet, Rfl: 1  No Known Allergies  Review of Systems  Constitutional: Negative for fever, chills and weight loss.  HENT: Negative for congestion, hearing loss, sore throat and tinnitus.   Eyes: Negative for blurred vision, double vision and redness.  Respiratory: Negative for cough, hemoptysis and shortness of breath.   Cardiovascular: Negative for chest pain, palpitations, orthopnea, claudication and leg swelling.  Gastrointestinal: Negative for heartburn, nausea, vomiting, diarrhea, constipation and blood in stool.  Genitourinary: Negative for dysuria, urgency, frequency and hematuria.  Musculoskeletal: Positive for back pain and joint pain (shoulders and hips). Negative for myalgias, falls and neck pain.  Skin: Negative for itching.  Neurological: Negative for dizziness, tingling, tremors, focal weakness, seizures, loss of consciousness, weakness and headaches.  Endo/Heme/Allergies: Does not bruise/bleed easily.  Psychiatric/Behavioral: Negative for depression and substance abuse. The patient is not nervous/anxious and does not have insomnia.      Objective  Filed Vitals:   09/16/15 0751  BP: 122/70  Pulse: 78  Temp: 97.6 F (36.4 C)  TempSrc: Oral  Resp: 16  Height: 5\' 8"  (1.727 m)  Weight: 182 lb 11.2 oz (82.872 kg)  SpO2: 97%     Physical Exam  Constitutional: He is oriented to person, place, and time and well-developed, well-nourished, and in no distress.  HENT:  Head: Normocephalic.  Eyes:  EOM are normal. Pupils are equal, round, and reactive to light.  Neck: Normal range of motion. Neck supple. No thyromegaly present.  Cardiovascular: Normal rate, regular rhythm and normal heart sounds.   No murmur heard. Pulmonary/Chest: Effort normal and breath sounds normal. No respiratory distress. He has no wheezes.  Abdominal: Soft. Bowel sounds are normal.  Musculoskeletal: Normal range of motion. He exhibits no edema.  Mild limitation of abduction and internal/external rotation of both shoulders. Abduction of both knees is limited more so on the left than right  Lymphadenopathy:    He has no cervical adenopathy.  Neurological: He is alert and oriented to person, place, and time. No cranial nerve deficit. Gait normal. Coordination normal.  Skin: Skin is warm and dry. No rash noted.  Psychiatric: Affect and judgment normal.      Assessment & Plan.  1. Type 2 diabetes mellitus with other specified complication ([place]) Not quite at goal. We'll increase his in the, 300 if not below 7.5 and A1c on return visit - POCT HgB A1C - POCT Glucose (  CBG) - Comprehensive Metabolic Panel (CMET)  - TSH  2. Arteriosclerosis of coronary artery Stable - Comprehensive Metabolic Panel (CMET) - Lipid panel - TSH  3. Benign essential HTN Well-controlled   4. Hypercholesterolemia Lab today - Comprehensive Metabolic Panel (CMET) - Lipid panel  5. Midline low back pain with sciatica, sciatica laterality unspecified Meloxicam - meloxicam (MOBIC) 15 MG tablet; Take 1 tablet (15 mg total) by mouth daily.  Dispense: 30 tablet; Refill: 5  6. Primary osteoarthritis involving multiple joints Meloxicam. If not improving 1 month working we will x-ray the right shoulder and the left hip - meloxicam (MOBIC) 15 MG tablet; Take 1 tablet (15 mg total) by mouth daily.  Dispense: 30 tablet; Refill: 5

## 2015-09-17 LAB — COMPREHENSIVE METABOLIC PANEL
ALBUMIN: 4.6 g/dL (ref 3.6–4.8)
ALK PHOS: 55 IU/L (ref 39–117)
ALT: 15 IU/L (ref 0–44)
AST: 16 IU/L (ref 0–40)
Albumin/Globulin Ratio: 1.8 (ref 1.1–2.5)
BILIRUBIN TOTAL: 0.4 mg/dL (ref 0.0–1.2)
BUN / CREAT RATIO: 25 — AB (ref 10–22)
BUN: 21 mg/dL (ref 8–27)
CHLORIDE: 98 mmol/L (ref 97–106)
CO2: 25 mmol/L (ref 18–29)
Calcium: 10.4 mg/dL — ABNORMAL HIGH (ref 8.6–10.2)
Creatinine, Ser: 0.83 mg/dL (ref 0.76–1.27)
GFR calc non Af Amer: 92 mL/min/{1.73_m2} (ref >59)
GFR, EST AFRICAN AMERICAN: 106 mL/min/{1.73_m2} (ref >59)
GLOBULIN, TOTAL: 2.5 g/dL (ref 1.5–4.5)
Glucose: 149 mg/dL — ABNORMAL HIGH (ref 65–99)
Potassium: 5.1 mmol/L (ref 3.5–5.2)
SODIUM: 140 mmol/L (ref 136–144)
TOTAL PROTEIN: 7.1 g/dL (ref 6.0–8.5)

## 2015-09-17 LAB — LIPID PANEL
CHOLESTEROL TOTAL: 134 mg/dL (ref 100–199)
Chol/HDL Ratio: 2.9 ratio units (ref 0.0–5.0)
HDL: 47 mg/dL (ref >39)
LDL CALC: 62 mg/dL (ref 0–99)
Triglycerides: 123 mg/dL (ref 0–149)
VLDL Cholesterol Cal: 25 mg/dL (ref 5–40)

## 2015-09-17 LAB — PSA: PROSTATE SPECIFIC AG, SERUM: 1 ng/mL (ref 0.0–4.0)

## 2015-09-17 LAB — TSH: TSH: 1.63 u[IU]/mL (ref 0.450–4.500)

## 2015-10-02 ENCOUNTER — Ambulatory Visit (INDEPENDENT_AMBULATORY_CARE_PROVIDER_SITE_OTHER): Payer: 59 | Admitting: Family Medicine

## 2015-10-02 ENCOUNTER — Ambulatory Visit
Admission: RE | Admit: 2015-10-02 | Discharge: 2015-10-02 | Disposition: A | Discharge status: Home / Self Care | Payer: 59 | Source: Ambulatory Visit | Attending: Family Medicine | Admitting: Family Medicine

## 2015-10-02 ENCOUNTER — Encounter: Payer: Self-pay | Admitting: Family Medicine

## 2015-10-02 VITALS — BP 118/72 | HR 81 | Temp 98.6°F | Resp 16 | Ht 68.0 in | Wt 186.1 lb

## 2015-10-02 DIAGNOSIS — M25511 Pain in right shoulder: Secondary | ICD-10-CM | POA: Diagnosis present

## 2015-10-02 DIAGNOSIS — M25512 Pain in left shoulder: Principal | ICD-10-CM

## 2015-10-02 MED ORDER — CYCLOBENZAPRINE HCL 5 MG PO TABS: 5.0000 mg | ORAL_TABLET | Freq: Every day | ORAL | Status: DC

## 2015-10-02 MED ORDER — TRAMADOL HCL 50 MG PO TABS: 50.0000 mg | ORAL_TABLET | Freq: Three times a day (TID) | ORAL | Status: DC | PRN

## 2015-10-02 NOTE — Progress Notes (Signed)
Name: Maurice Fischer   MRN: [id number]    DOB: [birth year]   Date:10/02/2015       Progress Note  Subjective  Chief Complaint  Chief Complaint  Patient presents with  . Shoulder Pain    bilateral pain for 3 weeks.  pain at night worse    HPI  Bilateral shoulder pain over the last 3 weeks which is worse at night.  The pain has been worse when he abducts his shoulders. Meloxicam is brought some relief. He however continues to have nighttime awakening because of the shoulder pain. Despite the discomfort has been able to play golf and often his pain is improved after a round of golf.  Past Medical History  Diagnosis Date  . Diabetes mellitus without complication ([place])   . Hyperlipidemia   . Heart disease   . Diverticulosis 2004  . Hypertension   . Umbilical hernia [id number]  . Inguinal hernia 2015    right    Social History  Substance Use Topics  . Smoking status: Former Smoker -- 25 years    Types: Cigarettes    Quit date: 10/08/1992  . Smokeless tobacco: Never Used  . Alcohol Use: 4.2 oz/week    7 Standard drinks or equivalent per week     Current outpatient prescriptions:  .  aspirin 81 MG tablet, Take 81 mg by mouth daily., Disp: , Rfl:  .  atenolol (TENORMIN) 50 MG tablet, Take 1 tablet (50 mg total) by mouth daily., Disp: 90 tablet, Rfl: 1 .  atorvastatin (LIPITOR) 40 MG tablet, Take 1 tablet (40 mg total) by mouth daily., Disp: 90 tablet, Rfl: 1 .  canagliflozin (INVOKANA) 100 MG TABS tablet, Take 1 tablet (100 mg total) by mouth daily., Disp: 30 tablet, Rfl: 5 .  Cholecalciferol (VITAMIN D3) 2000 UNITS TABS, Take 1 tablet by mouth 2 (two) times daily., Disp: , Rfl:  .  fenofibrate (TRICOR) 145 MG tablet, Take 1 tablet (145 mg total) by mouth daily., Disp: 90 tablet, Rfl: 1 .  meloxicam (MOBIC) 15 MG tablet, Take 1 tablet (15 mg total) by mouth daily., Disp: 30 tablet, Rfl: 5 .  metFORMIN (GLUCOPHAGE) 1000 MG tablet, Take 1 tablet (1,000 mg total) by mouth 2 (two) times  daily with a meal., Disp: 180 tablet, Rfl: 1 .  Multiple Vitamin (MULTIVITAMIN) tablet, Take 1 tablet by mouth daily., Disp: , Rfl:  .  Omega-3 Fatty Acids (FISH OIL) 1200 MG CAPS, Take 1 capsule by mouth 4 (four) times daily., Disp: , Rfl:  .  polyethylene glycol powder (GLYCOLAX/MIRALAX) powder, 255 grams one bottle for colonoscopy prep, Disp: 255 g, Rfl: 0 .  quinapril (ACCUPRIL) 20 MG tablet, Take 1 tablet (20 mg total) by mouth daily., Disp: 90 tablet, Rfl: 1  No Known Allergies  Review of Systems  Constitutional: Negative.   HENT: Negative.   Musculoskeletal: Positive for joint pain (bilateral shoulder pain).  Skin: Negative.      Objective  Filed Vitals:   10/02/15 1015  BP: 118/72  Pulse: 81  Temp: 98.6 F (37 C)  TempSrc: Oral  Resp: 16  Height: 5\' 8"  (1.727 m)  Weight: 186 lb 1.6 oz (84.414 kg)  SpO2: 97%     Physical Exam  Constitutional: He is well-developed, well-nourished, and in no distress.  HENT:  Head: Normocephalic.  Eyes: Pupils are equal, round, and reactive to light.  Neck: Normal range of motion.  Cardiovascular: Normal rate.   Pulmonary/Chest: Effort normal and breath sounds normal.  Musculoskeletal:  Some discomfort of active abduction of the shoulders above 110. Internal and external rotation have mild limitation minimal crepitus is noted  Psychiatric: Affect and judgment normal.      Assessment & Plan  1. Shoulder pain, bilateral Cannot rule out early rotator cuff syndrome - cyclobenzaprine (FLEXERIL) 5 MG tablet; Take 1 tablet (5 mg total) by mouth at bedtime.  Dispense: 30 tablet; Refill: 0 - traMADol (ULTRAM) 50 MG tablet; Take 1 tablet (50 mg total) by mouth every 8 (eight) hours as needed.  Dispense: 30 tablet; Refill: 0 - DG Shoulder Left; Future - DG Shoulder Right; Future - AMB referral to orthopedics

## 2015-10-08 ENCOUNTER — Telehealth: Payer: Self-pay | Admitting: Emergency Medicine

## 2015-10-08 DIAGNOSIS — M25519 Pain in unspecified shoulder: Secondary | ICD-10-CM

## 2015-10-08 NOTE — Telephone Encounter (Signed)
Patient notified. Appointment [facility] October 14, 2015 at 9:30 am Dr. Tamala Julian.

## 2015-11-05 DIAGNOSIS — M7551 Bursitis of right shoulder: Secondary | ICD-10-CM | POA: Diagnosis not present

## 2015-12-29 ENCOUNTER — Telehealth: Payer: Self-pay | Admitting: Family Medicine

## 2015-12-29 DIAGNOSIS — E1165 Type 2 diabetes mellitus with hyperglycemia: Secondary | ICD-10-CM

## 2015-12-29 DIAGNOSIS — E1169 Type 2 diabetes mellitus with other specified complication: Principal | ICD-10-CM

## 2015-12-29 DIAGNOSIS — M159 Polyosteoarthritis, unspecified: Secondary | ICD-10-CM

## 2015-12-29 DIAGNOSIS — M150 Primary generalized (osteo)arthritis: Secondary | ICD-10-CM

## 2015-12-29 DIAGNOSIS — M25512 Pain in left shoulder: Secondary | ICD-10-CM

## 2015-12-29 DIAGNOSIS — M25511 Pain in right shoulder: Secondary | ICD-10-CM

## 2015-12-29 DIAGNOSIS — M5440 Lumbago with sciatica, unspecified side: Secondary | ICD-10-CM

## 2015-12-29 DIAGNOSIS — IMO0002 Reserved for concepts with insufficient information to code with codable children: Secondary | ICD-10-CM

## 2015-12-29 MED ORDER — CANAGLIFLOZIN 100 MG PO TABS: 100.0000 mg | ORAL_TABLET | Freq: Every day | ORAL | Status: DC

## 2015-12-29 MED ORDER — ATORVASTATIN CALCIUM 40 MG PO TABS: 40.0000 mg | ORAL_TABLET | Freq: Every day | ORAL | Status: DC

## 2015-12-29 MED ORDER — CYCLOBENZAPRINE HCL 5 MG PO TABS: 5.0000 mg | ORAL_TABLET | Freq: Every day | ORAL | Status: DC

## 2015-12-29 MED ORDER — QUINAPRIL HCL 20 MG PO TABS: 20.0000 mg | ORAL_TABLET | Freq: Every day | ORAL | Status: DC

## 2015-12-29 MED ORDER — ATENOLOL 50 MG PO TABS: 50.0000 mg | ORAL_TABLET | Freq: Every day | ORAL | Status: DC

## 2015-12-29 MED ORDER — METFORMIN HCL 1000 MG PO TABS: 1000.0000 mg | ORAL_TABLET | Freq: Two times a day (BID) | ORAL | Status: DC

## 2015-12-29 MED ORDER — MELOXICAM 15 MG PO TABS: 15.0000 mg | ORAL_TABLET | Freq: Every day | ORAL | Status: DC

## 2015-12-29 MED ORDER — FENOFIBRATE 145 MG PO TABS: 145.0000 mg | ORAL_TABLET | Freq: Every day | ORAL | Status: DC

## 2015-12-29 NOTE — Telephone Encounter (Signed)
Sent refills to [facility] since pharmacy was not specified in message

## 2015-12-29 NOTE — Telephone Encounter (Signed)
Patient is requesting a refill on all his medications. He had to resch his appointment for 02-23-16

## 2015-12-30 MED ORDER — TRAMADOL HCL 50 MG PO TABS: 50.0000 mg | ORAL_TABLET | Freq: Three times a day (TID) | ORAL | Status: DC | PRN

## 2015-12-30 NOTE — Telephone Encounter (Signed)
Called pharmacy and medications have already been filled. I di print out his tramadol if he would like to pick that one up.

## 2015-12-30 NOTE — Telephone Encounter (Signed)
Please print out all the prescriptions he is requesting to pick them up

## 2015-12-31 NOTE — Telephone Encounter (Signed)
Patient informed. 

## 2016-01-14 ENCOUNTER — Ambulatory Visit: Payer: 59 | Admitting: Family Medicine

## 2016-02-23 ENCOUNTER — Ambulatory Visit: Payer: 59 | Admitting: Family Medicine

## 2016-03-11 DIAGNOSIS — L237 Allergic contact dermatitis due to plants, except food: Secondary | ICD-10-CM | POA: Diagnosis not present

## 2016-04-04 ENCOUNTER — Encounter: Payer: Self-pay | Admitting: Family Medicine

## 2016-04-04 ENCOUNTER — Ambulatory Visit (INDEPENDENT_AMBULATORY_CARE_PROVIDER_SITE_OTHER): Payer: 59 | Admitting: Family Medicine

## 2016-04-04 VITALS — BP 110/70 | HR 89 | Temp 97.9°F | Resp 18 | Ht 68.0 in | Wt 181.1 lb

## 2016-04-04 DIAGNOSIS — I10 Essential (primary) hypertension: Secondary | ICD-10-CM

## 2016-04-04 DIAGNOSIS — M150 Primary generalized (osteo)arthritis: Secondary | ICD-10-CM

## 2016-04-04 DIAGNOSIS — M159 Polyosteoarthritis, unspecified: Secondary | ICD-10-CM

## 2016-04-04 DIAGNOSIS — I2510 Atherosclerotic heart disease of native coronary artery without angina pectoris: Secondary | ICD-10-CM | POA: Diagnosis not present

## 2016-04-04 DIAGNOSIS — E119 Type 2 diabetes mellitus without complications: Secondary | ICD-10-CM | POA: Diagnosis not present

## 2016-04-04 DIAGNOSIS — E785 Hyperlipidemia, unspecified: Secondary | ICD-10-CM | POA: Diagnosis not present

## 2016-04-04 DIAGNOSIS — E559 Vitamin D deficiency, unspecified: Secondary | ICD-10-CM | POA: Diagnosis not present

## 2016-04-04 DIAGNOSIS — E663 Overweight: Secondary | ICD-10-CM | POA: Diagnosis not present

## 2016-04-04 LAB — POCT GLYCOSYLATED HEMOGLOBIN (HGB A1C): Hemoglobin A1C: 7.7

## 2016-04-04 LAB — GLUCOSE, POCT (MANUAL RESULT ENTRY): POC Glucose: 143 mg/dl — AB (ref 70–99)

## 2016-04-05 MED ORDER — ACETAMINOPHEN 500 MG PO TABS: 1000.0000 mg | ORAL_TABLET | Freq: Two times a day (BID) | ORAL | Status: DC

## 2016-04-05 NOTE — Progress Notes (Signed)
Date:  04/04/2016   Name:  Maurice Fischer   DOB:  1950-11-08   MRN:  [id number]  PCP:  Ashok Norris, MD    Chief Complaint: Medication Refill   History of Present Illness:  This is a 65 y.o. male seen for six month f/u. T2DM on metformin/Invokana, not so good with diet. HTN on atenolol/quinapril. HLD on Lipitor/Tricor. On vit D supp daily and Mobic daily for OA.  Review of Systems:  Review of Systems  Constitutional: Negative for fever and fatigue.  Respiratory: Negative for cough and shortness of breath.   Cardiovascular: Negative for chest pain and leg swelling.  Endocrine: Negative for polyuria.  Genitourinary: Negative for difficulty urinating.  Neurological: Negative for syncope and light-headedness.    Patient Active Problem List   Diagnosis Date Noted  . Overweight (BMI 25.0-29.9) 04/04/2016  . Hypertension 04/04/2016  . Vitamin D deficiency 04/04/2016  . Osteoarthritis 09/16/2015  . Type 2 diabetes mellitus ([place]) 09/16/2015  . 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 CD (contact dermatitis) 05/28/2015  . Diabetes mellitus type 2, uncontrolled ([place]) 05/28/2015  . HLD (hyperlipidemia) 05/28/2015  . Inguinal hernia 05/28/2015  . Umbilical hernia 0000000  . Atherosclerosis of coronary artery 02/08/2007  . LBP (low back pain) 02/08/2007    Prior to Admission medications   Medication Sig Start Date End Date Taking? Authorizing Provider  aspirin 81 MG tablet Take 81 mg by mouth daily.    Historical Provider, MD  atenolol (TENORMIN) 50 MG tablet Take 1 tablet (50 mg total) by mouth daily. 12/29/15   Ashok Norris, MD  atorvastatin (LIPITOR) 40 MG tablet Take 1 tablet (40 mg total) by mouth daily. 12/29/15   Ashok Norris, MD  canagliflozin (INVOKANA) 100 MG TABS tablet Take 1 tablet (100 mg total) by mouth daily. 12/29/15   Ashok Norris, MD  Cholecalciferol (VITAMIN D3) 2000 UNITS TABS Take 1 tablet by mouth daily.    Historical Provider, MD  fenofibrate (TRICOR) 145 MG tablet  Take 1 tablet (145 mg total) by mouth daily. 12/29/15   Ashok Norris, MD  meloxicam (MOBIC) 15 MG tablet Take 1 tablet (15 mg total) by mouth daily. 12/29/15   Ashok Norris, MD  metFORMIN (GLUCOPHAGE) 1000 MG tablet Take 1 tablet (1,000 mg total) by mouth 2 (two) times daily with a meal. 12/29/15   Ashok Norris, MD  Multiple Vitamin (MULTIVITAMIN) tablet Take 1 tablet by mouth daily.    Historical Provider, MD  quinapril (ACCUPRIL) 20 MG tablet Take 1 tablet (20 mg total) by mouth daily. 12/29/15   Ashok Norris, MD    No Known Allergies  Past Surgical History  Procedure Laterality Date  . Tonsillectomy  1957?  [name] Colonoscopy  2004  . Hernia repair  1959?  [name] Hernia repair  123456    umbilical hernia  . Hernia repair  2015    right inguinal hernia repair    Social History  Substance Use Topics  . Smoking status: Former Smoker -- 25 years    Types: Cigarettes    Quit date: 10/08/1992  . Smokeless tobacco: Never Used  . Alcohol Use: 4.2 oz/week    7 Standard drinks or equivalent per week    Family History  Problem Relation Age of Onset  . Cancer Father     prostate  . Coronary artery disease Mother   . Coronary artery disease Brother     Medication list has been reviewed and updated.  Physical Examination: BP 110/70 mmHg  Pulse 89  Temp(Src) 97.9 F (36.6 C)  Resp 18  Ht 5\' 8"  (1.727 m)  Wt 181 lb 1 oz (82.129 kg)  BMI 27.54 kg/m2  SpO2 96%  Physical Exam  Constitutional: He appears well-developed and well-nourished.  Cardiovascular: Normal rate, regular rhythm and normal heart sounds.   Pulmonary/Chest: Effort normal and breath sounds normal.  Musculoskeletal: He exhibits no edema.  Neurological: He is alert.  Skin: Skin is warm and dry.  Psychiatric: He has a normal mood and affect. His behavior is normal.  Nursing note and vitals reviewed.   Assessment and Plan:  1. Type 2 diabetes mellitus without complication, without long-term current use of insulin  (HCC) A1c today 7.7%, continue metformin/Invokana, improve diet, lose weight - POCT HgB A1C - POCT Glucose (CBG) - Urine Microalbumin w/creat. ratio - B12  2. Essential hypertension Well controlled, cont atenolol/quinapril  3. Atherosclerosis of native coronary artery of native heart without angina pectoris Cont asa/statin/BB/ACEI  4. Primary osteoarthritis involving multiple joints Advised d/c Mobic (avoid over age 19), try Tylenol ES 2 bid instead  5. HLD (hyperlipidemia) Well controlled on Lipitor/Tricor  6. Vitamin D deficiency On supplement - Vitamin D (25 hydroxy)  7. Overweight (BMI 25.0-29.9) Advised exercsie/weight loss  Return in about 3 months (around 07/05/2016).  Satira Anis. [facility]  04/05/2016

## 2016-04-18 DIAGNOSIS — E559 Vitamin D deficiency, unspecified: Secondary | ICD-10-CM | POA: Diagnosis not present

## 2016-04-18 DIAGNOSIS — E119 Type 2 diabetes mellitus without complications: Secondary | ICD-10-CM | POA: Diagnosis not present

## 2016-04-19 ENCOUNTER — Other Ambulatory Visit: Payer: Self-pay | Admitting: Family Medicine

## 2016-04-19 LAB — VITAMIN D 25 HYDROXY (VIT D DEFICIENCY, FRACTURES): Vit D, 25-Hydroxy: 33.2 ng/mL (ref 30.0–100.0)

## 2016-04-19 LAB — MICROALBUMIN / CREATININE URINE RATIO
CREATININE, UR: 75.6 mg/dL
MICROALB/CREAT RATIO: <4 mg/g creat (ref 0.0–30.0)

## 2016-04-19 LAB — VITAMIN B12: Vitamin B-12: 380 pg/mL (ref 211–946)

## 2016-07-05 ENCOUNTER — Encounter: Payer: Self-pay | Admitting: Family Medicine

## 2016-07-05 ENCOUNTER — Ambulatory Visit (INDEPENDENT_AMBULATORY_CARE_PROVIDER_SITE_OTHER): Payer: 59 | Admitting: Family Medicine

## 2016-07-05 VITALS — BP 112/63 | HR 77 | Temp 98.6°F | Resp 16 | Ht 68.0 in | Wt 181.1 lb

## 2016-07-05 DIAGNOSIS — E119 Type 2 diabetes mellitus without complications: Secondary | ICD-10-CM | POA: Insufficient documentation

## 2016-07-05 DIAGNOSIS — E1165 Type 2 diabetes mellitus with hyperglycemia: Secondary | ICD-10-CM | POA: Diagnosis not present

## 2016-07-05 DIAGNOSIS — I10 Essential (primary) hypertension: Secondary | ICD-10-CM | POA: Diagnosis not present

## 2016-07-05 DIAGNOSIS — IMO0001 Reserved for inherently not codable concepts without codable children: Secondary | ICD-10-CM

## 2016-07-05 DIAGNOSIS — E785 Hyperlipidemia, unspecified: Secondary | ICD-10-CM | POA: Diagnosis not present

## 2016-07-05 DIAGNOSIS — IMO0002 Reserved for concepts with insufficient information to code with codable children: Secondary | ICD-10-CM | POA: Insufficient documentation

## 2016-07-05 LAB — POCT GLYCOSYLATED HEMOGLOBIN (HGB A1C): Hemoglobin A1C: 8.3

## 2016-07-05 MED ORDER — QUINAPRIL HCL 20 MG PO TABS: 20.0000 mg | ORAL_TABLET | Freq: Every day | ORAL | 1 refills | Status: DC

## 2016-07-05 MED ORDER — ATORVASTATIN CALCIUM 40 MG PO TABS: 40.0000 mg | ORAL_TABLET | Freq: Every day | ORAL | 1 refills | Status: DC

## 2016-07-05 MED ORDER — SITAGLIPTIN PHOSPHATE 100 MG PO TABS: 100.0000 mg | ORAL_TABLET | Freq: Every day | ORAL | 1 refills | Status: DC

## 2016-07-05 MED ORDER — ATENOLOL 50 MG PO TABS: 50.0000 mg | ORAL_TABLET | Freq: Every day | ORAL | 1 refills | Status: DC

## 2016-07-05 MED ORDER — METFORMIN HCL 1000 MG PO TABS: 1000.0000 mg | ORAL_TABLET | Freq: Two times a day (BID) | ORAL | 1 refills | Status: DC

## 2016-07-05 NOTE — Progress Notes (Signed)
Name: Maurice Fischer   MRN: [id number]    DOB: 1951-01-17   Date:07/05/2016       Progress Note  Subjective  Chief Complaint  Chief Complaint  Patient presents with  . Follow-up    3 mo  . Medication Refill  This patient is usually followed by Dr. Rutherford Nail, new to me  Diabetes  He presents for his follow-up diabetic visit. He has type 2 diabetes mellitus. His disease course has been worsening. There are no hypoglycemic associated symptoms. Pertinent negatives for hypoglycemia include no headaches. Associated symptoms include polyuria. Pertinent negatives for diabetes include no blurred vision, no chest pain, no fatigue and no polydipsia. Pertinent negatives for diabetic complications include no CVA. Current diabetic treatment includes oral agent (dual therapy). He is following a generally healthy (tries to follow a balanced diabetic diet, also irregular in meals) diet. His home blood glucose trend is fluctuating dramatically. His breakfast blood glucose range is generally 140-180 mg/dl. An ACE inhibitor/angiotensin II receptor blocker is being taken.  Hypertension  This is a chronic problem. The problem is unchanged. The problem is controlled. Pertinent negatives include no blurred vision, chest pain, headaches, palpitations or shortness of breath. Past treatments include beta blockers and ACE inhibitors. Hypertensive end-organ damage includes CAD/MI (Heart Attack in 1993). There is no history of kidney disease or CVA.     Past Medical History:  Diagnosis Date  . Diabetes mellitus without complication ([place])   . Diverticulosis 2004  . Heart disease   . Hyperlipidemia   . Hypertension   . Inguinal hernia 2015   right  . Umbilical hernia 123456    Past Surgical History:  Procedure Laterality Date  . COLONOSCOPY  2004  . [place]?  [name] HERNIA REPAIR  123456   umbilical hernia  . HERNIA REPAIR  2015   right inguinal hernia repair  . TONSILLECTOMY  1957?    Family History   Problem Relation Age of Onset  . Cancer Father     prostate  . Coronary artery disease Mother   . Coronary artery disease Brother     Social History   Social History  . Marital status: Married    Spouse name: N/A  . Number of children: N/A  . Years of education: N/A   Occupational History  . Not on file.   Social History Main Topics  . Smoking status: Former Smoker    Years: 25.00    Types: Cigarettes    Quit date: 10/08/1992  . Smokeless tobacco: Never Used  . Alcohol use 4.2 oz/week    7 Standard drinks or equivalent per week  . Drug use: No  . Sexual activity: Not Currently   Other Topics Concern  . Not on file   Social History Narrative  . No narrative on file     Current Outpatient Prescriptions:  .  aspirin 81 MG tablet, Take 81 mg by mouth daily., Disp: , Rfl:  .  atenolol (TENORMIN) 50 MG tablet, Take 1 tablet (50 mg total) by mouth daily., Disp: 90 tablet, Rfl: 1 .  atorvastatin (LIPITOR) 40 MG tablet, Take 1 tablet (40 mg total) by mouth daily., Disp: 90 tablet, Rfl: 1 .  canagliflozin (INVOKANA) 100 MG TABS tablet, Take 1 tablet (100 mg total) by mouth daily., Disp: 30 tablet, Rfl: 5 .  Cholecalciferol (VITAMIN D3) 2000 UNITS TABS, Take 1 tablet by mouth daily., Disp: , Rfl:  .  metFORMIN (GLUCOPHAGE) 1000 MG tablet, Take 1  tablet (1,000 mg total) by mouth 2 (two) times daily with a meal., Disp: 180 tablet, Rfl: 1 .  Multiple Vitamin (MULTIVITAMIN) tablet, Take 1 tablet by mouth daily., Disp: , Rfl:  .  quinapril (ACCUPRIL) 20 MG tablet, Take 1 tablet (20 mg total) by mouth daily., Disp: 90 tablet, Rfl: 1 .  acetaminophen (TYLENOL) 500 MG tablet, Take 2 tablets (1,000 mg total) by mouth 2 (two) times daily. (Patient not taking: Reported on 07/05/2016), Disp: 30 tablet, Rfl: 0  No Known Allergies   Review of Systems  Constitutional: Negative for fatigue.  Eyes: Negative for blurred vision.  Respiratory: Negative for shortness of breath.    Cardiovascular: Negative for chest pain and palpitations.  Neurological: Negative for headaches.  Endo/Heme/Allergies: Negative for polydipsia.    Objective  Vitals:   07/05/16 1509  BP: 112/63  Pulse: 77  Resp: 16  Temp: 98.6 F (37 C)  TempSrc: Oral  SpO2: 95%  Weight: 181 lb 1.6 oz (82.1 kg)  Height: 5\' 8"  (1.727 m)    Physical Exam  Constitutional: He is well-developed, well-nourished, and in no distress.  HENT:  Head: Normocephalic and atraumatic.  Cardiovascular: Normal rate, regular rhythm and normal heart sounds.   No murmur heard. Pulmonary/Chest: Effort normal and breath sounds normal. He has no wheezes.  Abdominal: Soft. Bowel sounds are normal. There is no tenderness.  Neurological: He is alert.  Skin: Skin is warm and dry.  Psychiatric: Mood, memory, affect and judgment normal.  Nursing note and vitals reviewed.   Assessment & Plan  1. HLD (hyperlipidemia)  - Lipid Profile - COMPLETE METABOLIC PANEL WITH GFR - atorvastatin (LIPITOR) 40 MG tablet; Take 1 tablet (40 mg total) by mouth daily.  Dispense: 90 tablet; Refill: 1  2. Essential hypertension  - atenolol (TENORMIN) 50 MG tablet; Take 1 tablet (50 mg total) by mouth daily.  Dispense: 90 tablet; Refill: 1 - quinapril (ACCUPRIL) 20 MG tablet; Take 1 tablet (20 mg total) by mouth daily.  Dispense: 90 tablet; Refill: 1  3. Uncontrolled type 2 diabetes mellitus without complication, without long-term current use of insulin (HCC) A1c elevated above goal, DC Invokana and start on Januvia 100 mg daily - POCT HgB A1C - metFORMIN (GLUCOPHAGE) 1000 MG tablet; Take 1 tablet (1,000 mg total) by mouth 2 (two) times daily with a meal.  Dispense: 180 tablet; Refill: 1 - sitaGLIPtin (JANUVIA) 100 MG tablet; Take 1 tablet (100 mg total) by mouth daily.  Dispense: 90 tablet; Refill: 1   Anaira Seay [facility] A[facility] 07/05/2016 3:38 PM

## 2016-07-06 ENCOUNTER — Ambulatory Visit (INDEPENDENT_AMBULATORY_CARE_PROVIDER_SITE_OTHER): Payer: 59

## 2016-07-06 DIAGNOSIS — Z23 Encounter for immunization: Secondary | ICD-10-CM

## 2016-07-06 LAB — COMPLETE METABOLIC PANEL WITH GFR
ALK PHOS: 45 U/L (ref 40–115)
ALT: 18 U/L (ref 9–46)
AST: 17 U/L (ref 10–35)
Albumin: 4.3 g/dL (ref 3.6–5.1)
BILIRUBIN TOTAL: 0.4 mg/dL (ref 0.2–1.2)
BUN: 20 mg/dL (ref 7–25)
CO2: 27 mmol/L (ref 20–31)
Calcium: 10.2 mg/dL (ref 8.6–10.3)
Chloride: 101 mmol/L (ref 98–110)
Creat: 0.93 mg/dL (ref 0.70–1.25)
GFR, EST NON AFRICAN AMERICAN: 85 mL/min (ref >=60)
GLUCOSE: 208 mg/dL — AB (ref 65–99)
POTASSIUM: 5 mmol/L (ref 3.5–5.3)
SODIUM: 138 mmol/L (ref 135–146)
Total Protein: 6.8 g/dL (ref 6.1–8.1)

## 2016-07-06 LAB — LIPID PANEL
CHOL/HDL RATIO: 3.6 ratio (ref <=5.0)
Cholesterol: 153 mg/dL (ref 125–200)
HDL: 43 mg/dL (ref >=40)
LDL CALC: 63 mg/dL (ref <130)
TRIGLYCERIDES: 235 mg/dL — AB (ref <150)
VLDL: 47 mg/dL — ABNORMAL HIGH (ref <30)

## 2016-07-25 ENCOUNTER — Encounter: Payer: Self-pay | Admitting: Family Medicine

## 2016-07-25 ENCOUNTER — Ambulatory Visit (INDEPENDENT_AMBULATORY_CARE_PROVIDER_SITE_OTHER): Payer: 59 | Admitting: Family Medicine

## 2016-07-25 VITALS — BP 130/70 | HR 86 | Temp 98.1°F | Resp 16 | Ht 68.0 in | Wt 183.1 lb

## 2016-07-25 DIAGNOSIS — H40003 Preglaucoma, unspecified, bilateral: Secondary | ICD-10-CM | POA: Diagnosis not present

## 2016-07-25 DIAGNOSIS — E781 Pure hyperglyceridemia: Secondary | ICD-10-CM | POA: Diagnosis not present

## 2016-07-25 DIAGNOSIS — E119 Type 2 diabetes mellitus without complications: Secondary | ICD-10-CM | POA: Diagnosis not present

## 2016-07-25 MED ORDER — ICOSAPENT ETHYL 1 G PO CAPS: 2.0000 g | ORAL_CAPSULE | Freq: Two times a day (BID) | ORAL | 0 refills | Status: DC

## 2016-07-25 NOTE — Progress Notes (Signed)
Name: Maurice Fischer   MRN: [id number]    DOB: 1951-03-02   Date:07/25/2016       Progress Note  Subjective  Chief Complaint  Chief Complaint  Patient presents with  . Elevated Triglycerides    HPI  Hypertriglyceridemia:  Elevated triglycerides at 235 mg/dL, has history of hypertriglyceridemia in the past he has been on Fenofibrate which was discontinued. He now presents to be started on Vascepa. Pt. Has history of Diabetes and CAD, had a heart attack in 1993.    Past Medical History:  Diagnosis Date  . Diabetes mellitus without complication ([place])   . Diverticulosis 2004  . Heart disease   . Hyperlipidemia   . Hypertension   . Inguinal hernia 2015   right  . Umbilical hernia 123456    Past Surgical History:  Procedure Laterality Date  . COLONOSCOPY  2004  . [place]?  [name] HERNIA REPAIR  123456   umbilical hernia  . HERNIA REPAIR  2015   right inguinal hernia repair  . TONSILLECTOMY  1957?    Family History  Problem Relation Age of Onset  . Cancer Father     prostate  . Coronary artery disease Mother   . Coronary artery disease Brother     Social History   Social History  . Marital status: Married    Spouse name: N/A  . Number of children: N/A  . Years of education: N/A   Occupational History  . Not on file.   Social History Main Topics  . Smoking status: Former Smoker    Years: 25.00    Types: Cigarettes    Quit date: 10/08/1992  . Smokeless tobacco: Never Used  . Alcohol use 4.2 oz/week    7 Standard drinks or equivalent per week  . Drug use: No  . Sexual activity: Not Currently   Other Topics Concern  . Not on file   Social History Narrative  . No narrative on file     Current Outpatient Prescriptions:  .  acetaminophen (TYLENOL) 500 MG tablet, Take 2 tablets (1,000 mg total) by mouth 2 (two) times daily., Disp: 30 tablet, Rfl: 0 .  aspirin 81 MG tablet, Take 81 mg by mouth daily., Disp: , Rfl:  .  atenolol (TENORMIN) 50 MG  tablet, Take 1 tablet (50 mg total) by mouth daily., Disp: 90 tablet, Rfl: 1 .  atorvastatin (LIPITOR) 40 MG tablet, Take 1 tablet (40 mg total) by mouth daily., Disp: 90 tablet, Rfl: 1 .  Cholecalciferol (VITAMIN D3) 2000 UNITS TABS, Take 1 tablet by mouth daily., Disp: , Rfl:  .  metFORMIN (GLUCOPHAGE) 1000 MG tablet, Take 1 tablet (1,000 mg total) by mouth 2 (two) times daily with a meal., Disp: 180 tablet, Rfl: 1 .  Multiple Vitamin (MULTIVITAMIN) tablet, Take 1 tablet by mouth daily., Disp: , Rfl:  .  quinapril (ACCUPRIL) 20 MG tablet, Take 1 tablet (20 mg total) by mouth daily., Disp: 90 tablet, Rfl: 1 .  sitaGLIPtin (JANUVIA) 100 MG tablet, Take 1 tablet (100 mg total) by mouth daily., Disp: 90 tablet, Rfl: 1  No Known Allergies   Review of Systems  Constitutional: Negative for chills and fever.  Respiratory: Negative for shortness of breath.   Cardiovascular: Negative for chest pain.  Gastrointestinal: Negative for abdominal pain.     Objective  Vitals:   07/25/16 1426  BP: 130/70  Pulse: 86  Resp: 16  Temp: 98.1 F (36.7 C)  TempSrc: Oral  SpO2: 97%  Weight: 183 lb 1.6 oz (83.1 kg)  Height: 5\' 8"  (1.727 m)    Physical Exam  Constitutional: He is oriented to person, place, and time and well-developed, well-nourished, and in no distress.  HENT:  Head: Normocephalic and atraumatic.  Cardiovascular: Normal rate, regular rhythm and normal heart sounds.   No murmur heard. Pulmonary/Chest: Effort normal and breath sounds normal. He has no wheezes.  Abdominal: Soft. Bowel sounds are normal. There is no tenderness.  Neurological: He is alert and oriented to person, place, and time.  Psychiatric: Mood, memory, affect and judgment normal.  Nursing note and vitals reviewed.  Assessment & Plan  1. Hypertriglyceridemia Started on Vascepa 2g twice daily. - Icosapent Ethyl 1 g CAPS; Take 2 g by mouth 2 (two) times daily with a meal.  Dispense: 360 capsule; Refill:  0   Sunnie Odden Asad A. [facility] 07/25/2016 2:32 PM

## 2016-08-24 ENCOUNTER — Encounter: Payer: Self-pay | Admitting: *Deleted

## 2016-08-25 ENCOUNTER — Ambulatory Visit (INDEPENDENT_AMBULATORY_CARE_PROVIDER_SITE_OTHER): Payer: 59 | Admitting: General Surgery

## 2016-08-25 ENCOUNTER — Encounter: Payer: Self-pay | Admitting: General Surgery

## 2016-08-25 VITALS — BP 112/58 | HR 78 | Resp 14 | Ht 68.0 in | Wt 184.0 lb

## 2016-08-25 DIAGNOSIS — T8189XA Other complications of procedures, not elsewhere classified, initial encounter: Secondary | ICD-10-CM

## 2016-08-25 NOTE — Patient Instructions (Signed)
The patient is aware to call back for any questions or concerns.  

## 2016-08-25 NOTE — Progress Notes (Signed)
Patient ID: Maurice Fischer, male   DOB: 02/26/1951, 65 y.o.   MRN: [id number]  Chief Complaint  Patient presents with  . Follow-up    HPI Maurice Fischer is a 65 y.o. male who present today regarding an irritating area above his umbilicus.  He states it has been there for at least 1-2 months. Denies pain. Underwent an umbilical hernia repair in March 123456 with no complications.   I have reviewed the history of present illness with the patient.   HPI  Past Medical History:  Diagnosis Date  . Diabetes mellitus without complication ([place])   . Diverticulosis 2004  . Heart disease   . Hyperlipidemia   . Hypertension   . Inguinal hernia 2015   right  . Umbilical hernia 123456    Past Surgical History:  Procedure Laterality Date  . COLONOSCOPY  2004  . [place]?  [name] HERNIA REPAIR  [id number]   umbilical hernia/ Dr Maurice Fischer  . HERNIA REPAIR  01/13/2014   right inguinal hernia repair/ Dr Maurice Fischer  . TONSILLECTOMY  1957?    Family History  Problem Relation Age of Onset  . Cancer Father     prostate  . Coronary artery disease Mother   . Coronary artery disease Brother     Social History Social History  Substance Use Topics  . Smoking status: Former Smoker    Years: 25.00    Types: Cigarettes    Quit date: 10/08/1992  . Smokeless tobacco: Never Used  . Alcohol use 4.2 oz/week    7 Standard drinks or equivalent per week    No Known Allergies  Current Outpatient Prescriptions  Medication Sig Dispense Refill  . aspirin 81 MG tablet Take 81 mg by mouth daily.    [name] atenolol (TENORMIN) 50 MG tablet Take 1 tablet (50 mg total) by mouth daily. 90 tablet 1  . atorvastatin (LIPITOR) 40 MG tablet Take 1 tablet (40 mg total) by mouth daily. 90 tablet 1  . Cholecalciferol (VITAMIN D3) 2000 UNITS TABS Take 1 tablet by mouth daily.    Vanessa Kick Ethyl 1 Fischer CAPS Take 2 Fischer by mouth 2 (two) times daily with a meal. 360 capsule 0  . metFORMIN (GLUCOPHAGE) 1000 MG tablet Take  1 tablet (1,000 mg total) by mouth 2 (two) times daily with a meal. 180 tablet 1  . Multiple Vitamin (MULTIVITAMIN) tablet Take 1 tablet by mouth daily.    . quinapril (ACCUPRIL) 20 MG tablet Take 1 tablet (20 mg total) by mouth daily. 90 tablet 1  . sitaGLIPtin (JANUVIA) 100 MG tablet Take 1 tablet (100 mg total) by mouth daily. 90 tablet 1   No current facility-administered medications for this visit.     Review of Systems Review of Systems  Constitutional: Negative.   Respiratory: Negative.   Cardiovascular: Negative.   Gastrointestinal: Negative for blood in stool and constipation.    Blood pressure (!) 112/58, pulse 78, resp. rate 14, height 5\' 8"  (1.727 m), weight 184 lb (83.5 kg).  Physical Exam Physical Exam  Constitutional: He is oriented to person, place, and time. He appears well-developed and well-nourished.  Eyes: Conjunctivae are normal. No scleral icterus.  Neck: Neck supple.  Cardiovascular: Normal rate and regular rhythm.   Abdominal: Soft. There is no tenderness.  55mm raised area at umbilical port site incision. Small blue suture visible. No signs of infection.    Neurological: He is alert and oriented to person, place, and time.  Skin:  Skin is warm and dry.  Psychiatric: His behavior is normal.    Data Reviewed  Progress notes and procedure notes reviewed. Umbilical defect was closed with prolene stitch.   Assessment    Prolene stitch causing skin irritation at the umbilicus     Plan    Excision of the protruding part of the stitch was performed with consent from patient.  Anesthesia:  3 ml   0.5 % Marcaine/ 1 % Xylocaine prepped with Chloro prep   Procedure: Incision made over the 3 mm raised area. 3 mm long exposed prolene stitch was removed. Bleeding was minimal Area was dressed with Neosporin and gauze       This information has been scribed by Karie Fetch RN, BSN,BC.  MauriceSEEPLAPUTHUR Fischer 08/25/2016, 1:44 PM

## 2016-08-29 DIAGNOSIS — L570 Actinic keratosis: Secondary | ICD-10-CM | POA: Diagnosis not present

## 2016-08-29 DIAGNOSIS — D2261 Melanocytic nevi of right upper limb, including shoulder: Secondary | ICD-10-CM | POA: Diagnosis not present

## 2016-08-29 DIAGNOSIS — D225 Melanocytic nevi of trunk: Secondary | ICD-10-CM | POA: Diagnosis not present

## 2016-08-29 DIAGNOSIS — D2262 Melanocytic nevi of left upper limb, including shoulder: Secondary | ICD-10-CM | POA: Diagnosis not present

## 2016-08-29 DIAGNOSIS — X32XXXA Exposure to sunlight, initial encounter: Secondary | ICD-10-CM | POA: Diagnosis not present

## 2016-08-29 DIAGNOSIS — D2271 Melanocytic nevi of right lower limb, including hip: Secondary | ICD-10-CM | POA: Diagnosis not present

## 2016-09-28 DIAGNOSIS — M7540 Impingement syndrome of unspecified shoulder: Secondary | ICD-10-CM | POA: Insufficient documentation

## 2016-09-28 DIAGNOSIS — M7542 Impingement syndrome of left shoulder: Secondary | ICD-10-CM | POA: Diagnosis not present

## 2016-09-28 DIAGNOSIS — M7541 Impingement syndrome of right shoulder: Secondary | ICD-10-CM | POA: Diagnosis not present

## 2016-10-05 ENCOUNTER — Ambulatory Visit (INDEPENDENT_AMBULATORY_CARE_PROVIDER_SITE_OTHER): Payer: 59 | Admitting: Family Medicine

## 2016-10-05 ENCOUNTER — Encounter: Payer: Self-pay | Admitting: Family Medicine

## 2016-10-05 VITALS — BP 110/62 | HR 71 | Temp 97.4°F | Resp 16 | Ht 69.75 in | Wt 182.1 lb

## 2016-10-05 DIAGNOSIS — I10 Essential (primary) hypertension: Secondary | ICD-10-CM | POA: Diagnosis not present

## 2016-10-05 DIAGNOSIS — E781 Pure hyperglyceridemia: Secondary | ICD-10-CM | POA: Diagnosis not present

## 2016-10-05 DIAGNOSIS — E119 Type 2 diabetes mellitus without complications: Secondary | ICD-10-CM | POA: Diagnosis not present

## 2016-10-05 LAB — POCT GLYCOSYLATED HEMOGLOBIN (HGB A1C): Hemoglobin A1C: 7.8

## 2016-10-05 NOTE — Progress Notes (Signed)
Name: Maurice Fischer   MRN: [id number]    DOB: 1951-04-24   Date:10/05/2016       Progress Note  Subjective  Chief Complaint  Chief Complaint  Patient presents with  . Diabetes    patient is here for his 3 month f/u. patient checks his BS everyday. highest: 260 & lowest: 140. patient has recently had some cortisone injections in his shoulders.  . Hyperlipidemia    patient has been fine  . Hypertension    patient has not had any neg sx  . Labs Only    Diabetes  He presents for his follow-up diabetic visit. He has type 2 diabetes mellitus. His disease course has been worsening. There are no hypoglycemic associated symptoms. Pertinent negatives for hypoglycemia include no headaches. Associated symptoms include polyuria. Pertinent negatives for diabetes include no blurred vision, no chest pain, no fatigue and no polydipsia. Pertinent negatives for diabetic complications include no CVA. Current diabetic treatment includes oral agent (dual therapy). He is following a generally healthy diet. His home blood glucose trend is fluctuating dramatically. His breakfast blood glucose range is generally >200 mg/dl. An ACE inhibitor/angiotensin II receptor blocker is being taken.  Hyperlipidemia  This is a chronic problem. The problem is uncontrolled. Recent lipid tests were reviewed and are high. Pertinent negatives include no chest pain, leg pain, myalgias or shortness of breath. Current antihyperlipidemic treatment includes statins (Vascepa).  Hypertension  This is a chronic problem. The problem is unchanged. The problem is controlled. Pertinent negatives include no blurred vision, chest pain, headaches, palpitations or shortness of breath. Past treatments include beta blockers and ACE inhibitors. Hypertensive end-organ damage includes CAD/MI (Heart Attack in 1993). There is no history of kidney disease or CVA.    Past Medical History:  Diagnosis Date  . Diabetes mellitus without complication ([place])    . Diverticulosis 2004  . Heart disease   . Hyperlipidemia   . Hypertension   . Inguinal hernia 2015   right  . Umbilical hernia 123456    Past Surgical History:  Procedure Laterality Date  . COLONOSCOPY  2004  . [place]?  [name] HERNIA REPAIR  [id number]   umbilical hernia/ Dr Jamal Collin  . HERNIA REPAIR  01/13/2014   right inguinal hernia repair/ Dr Jamal Collin  . TONSILLECTOMY  1957?    Family History  Problem Relation Age of Onset  . Cancer Father     prostate  . Coronary artery disease Mother   . Coronary artery disease Brother     Social History   Social History  . Marital status: Married    Spouse name: N/A  . Number of children: N/A  . Years of education: N/A   Occupational History  . Not on file.   Social History Main Topics  . Smoking status: Former Smoker    Years: 25.00    Types: Cigarettes    Quit date: 10/08/1992  . Smokeless tobacco: Never Used  . Alcohol use 4.2 oz/week    7 Standard drinks or equivalent per week  . Drug use: No  . Sexual activity: Not Currently   Other Topics Concern  . Not on file   Social History Narrative  . No narrative on file     Current Outpatient Prescriptions:  .  aspirin 81 MG tablet, Take 81 mg by mouth daily., Disp: , Rfl:  .  atenolol (TENORMIN) 50 MG tablet, Take 1 tablet (50 mg total) by mouth daily., Disp: 90 tablet, Rfl: 1 .  atorvastatin (LIPITOR) 40 MG tablet, Take 1 tablet (40 mg total) by mouth daily., Disp: 90 tablet, Rfl: 1 .  Cholecalciferol (VITAMIN D3) 2000 UNITS TABS, Take 1 tablet by mouth daily., Disp: , Rfl:  .  Icosapent Ethyl 1 g CAPS, Take 2 g by mouth 2 (two) times daily with a meal., Disp: 360 capsule, Rfl: 0 .  metFORMIN (GLUCOPHAGE) 1000 MG tablet, Take 1 tablet (1,000 mg total) by mouth 2 (two) times daily with a meal., Disp: 180 tablet, Rfl: 1 .  Multiple Vitamin (MULTIVITAMIN) tablet, Take 1 tablet by mouth daily., Disp: , Rfl:  .  quinapril (ACCUPRIL) 20 MG tablet, Take 1 tablet  (20 mg total) by mouth daily., Disp: 90 tablet, Rfl: 1 .  sitaGLIPtin (JANUVIA) 100 MG tablet, Take 1 tablet (100 mg total) by mouth daily., Disp: 90 tablet, Rfl: 1  No Known Allergies   Review of Systems  Constitutional: Negative for fatigue.  Eyes: Negative for blurred vision.  Respiratory: Negative for shortness of breath.   Cardiovascular: Negative for chest pain and palpitations.  Musculoskeletal: Negative for myalgias.  Neurological: Negative for headaches.  Endo/Heme/Allergies: Negative for polydipsia.     Objective  Vitals:   10/05/16 0750  BP: 110/62  Pulse: 71  Resp: 16  Temp: 97.4 F (36.3 C)  TempSrc: Oral  SpO2: 97%  Weight: 182 lb 1.6 oz (82.6 kg)  Height: 5' 9.75" (1.772 m)    Physical Exam  Constitutional: He is oriented to person, place, and time and well-developed, well-nourished, and in no distress.  HENT:  Head: Normocephalic and atraumatic.  Cardiovascular: Normal rate, regular rhythm and normal heart sounds.   No murmur heard. Pulmonary/Chest: Effort normal and breath sounds normal. He has no wheezes.  Abdominal: Soft. Bowel sounds are normal. There is no tenderness.  Neurological: He is alert and oriented to person, place, and time.  Psychiatric: Mood, memory, affect and judgment normal.  Nursing note and vitals reviewed.      Assessment & Plan  1. Hypertriglyceridemia  - Lipid Profile - COMPLETE METABOLIC PANEL WITH GFR  2. Essential hypertension   3. Diabetes mellitus without complication (HCC)  [id number] improved from 8.3%, no change in pharmacotherapy. - POCT HgB A1C   Jakira Mcfadden Asad A. [facility] 10/05/2016 7:56 AM

## 2016-10-10 DIAGNOSIS — E781 Pure hyperglyceridemia: Secondary | ICD-10-CM | POA: Diagnosis not present

## 2016-10-11 LAB — COMPLETE METABOLIC PANEL WITH GFR
ALBUMIN: 4.2 g/dL (ref 3.6–5.1)
ALK PHOS: 36 U/L — AB (ref 40–115)
ALT: 34 U/L (ref 9–46)
AST: 18 U/L (ref 10–35)
BILIRUBIN TOTAL: 1 mg/dL (ref 0.2–1.2)
BUN: 21 mg/dL (ref 7–25)
CO2: 31 mmol/L (ref 20–31)
Calcium: 9.7 mg/dL (ref 8.6–10.3)
Chloride: 100 mmol/L (ref 98–110)
Creat: 0.85 mg/dL (ref 0.70–1.25)
Glucose, Bld: 176 mg/dL — ABNORMAL HIGH (ref 65–99)
POTASSIUM: 5.1 mmol/L (ref 3.5–5.3)
SODIUM: 136 mmol/L (ref 135–146)
TOTAL PROTEIN: 6.7 g/dL (ref 6.1–8.1)

## 2016-10-11 LAB — LIPID PANEL
CHOL/HDL RATIO: 3 ratio (ref <5.0)
Cholesterol: 135 mg/dL (ref <200)
HDL: 45 mg/dL (ref >40)
LDL CALC: 77 mg/dL (ref <100)
TRIGLYCERIDES: 64 mg/dL (ref <150)
VLDL: 13 mg/dL (ref <30)

## 2016-10-21 ENCOUNTER — Telehealth: Payer: Self-pay

## 2016-10-21 MED ORDER — GLUCOSE BLOOD VI STRP: ORAL_STRIP | 1 refills | Status: DC

## 2016-10-21 NOTE — Telephone Encounter (Signed)
Test strips have been refilled and sent to [place]

## 2016-11-07 ENCOUNTER — Ambulatory Visit (INDEPENDENT_AMBULATORY_CARE_PROVIDER_SITE_OTHER): Payer: 59 | Admitting: Family Medicine

## 2016-11-07 ENCOUNTER — Encounter: Payer: Self-pay | Admitting: Family Medicine

## 2016-11-07 VITALS — BP 111/6 | HR 89 | Temp 97.8°F | Resp 15 | Ht 70.0 in | Wt 186.2 lb

## 2016-11-07 DIAGNOSIS — E785 Hyperlipidemia, unspecified: Secondary | ICD-10-CM

## 2016-11-07 NOTE — Progress Notes (Signed)
Name: Maurice Fischer   MRN: [id number]    DOB: Feb 05, 1951   Date:11/07/2016       Progress Note  Subjective  Chief Complaint  Chief Complaint  Patient presents with  . Follow-up    Discuss Change in statin therapy    HPI  Hyperlipidemia: Pt. with history of hyperlipidemia and hypertriglyceridemia, elevated LDL to 77 (goal less than 70 due to hx of MI and DM), taking Atorvastatin 40 mg qhs, which seems to be working well without side effects.  He would like to recheck lipids in 3 months and compare the labs drawn at [facility] with those at [facility] to compare the two readings.    Past Medical History:  Diagnosis Date  . Diabetes mellitus without complication ([place])   . Diverticulosis 2004  . Heart disease   . Hyperlipidemia   . Hypertension   . Inguinal hernia 2015   right  . Umbilical hernia [id number]    Past Surgical History:  Procedure Laterality Date  . COLONOSCOPY  2004  . [place]?  [name] HERNIA REPAIR  [id number]   umbilical hernia/ Dr Jamal Collin  . HERNIA REPAIR  01/13/2014   right inguinal hernia repair/ Dr Jamal Collin  . TONSILLECTOMY  1957?    Family History  Problem Relation Age of Onset  . Cancer Father     prostate  . Coronary artery disease Mother   . Coronary artery disease Brother     Social History   Social History  . Marital status: Married    Spouse name: N/A  . Number of children: N/A  . Years of education: N/A   Occupational History  . Not on file.   Social History Main Topics  . Smoking status: Former Smoker    Years: 25.00    Types: Cigarettes    Quit date: 10/08/1992  . Smokeless tobacco: Never Used  . Alcohol use 4.2 oz/week    7 Standard drinks or equivalent per week  . Drug use: No  . Sexual activity: Not Currently   Other Topics Concern  . Not on file   Social History Narrative  . No narrative on file     Current Outpatient Prescriptions:  .  aspirin 81 MG tablet, Take 81 mg by mouth daily., Disp: , Rfl:  .  atenolol  (TENORMIN) 50 MG tablet, Take 1 tablet (50 mg total) by mouth daily., Disp: 90 tablet, Rfl: 1 .  atorvastatin (LIPITOR) 40 MG tablet, Take 1 tablet (40 mg total) by mouth daily., Disp: 90 tablet, Rfl: 1 .  Cholecalciferol (VITAMIN D3) 2000 UNITS TABS, Take 1 tablet by mouth daily., Disp: , Rfl:  .  glucose blood test strip, Use as instructed, Disp: 100 each, Rfl: 1 .  Icosapent Ethyl 1 g CAPS, Take 2 g by mouth 2 (two) times daily with a meal., Disp: 360 capsule, Rfl: 0 .  metFORMIN (GLUCOPHAGE) 1000 MG tablet, Take 1 tablet (1,000 mg total) by mouth 2 (two) times daily with a meal., Disp: 180 tablet, Rfl: 1 .  Multiple Vitamin (MULTIVITAMIN) tablet, Take 1 tablet by mouth daily., Disp: , Rfl:  .  quinapril (ACCUPRIL) 20 MG tablet, Take 1 tablet (20 mg total) by mouth daily., Disp: 90 tablet, Rfl: 1 .  sitaGLIPtin (JANUVIA) 100 MG tablet, Take 1 tablet (100 mg total) by mouth daily., Disp: 90 tablet, Rfl: 1  No Known Allergies   ROS  Please see HPI for complete discussion of ROS  Objective  Vitals:  11/07/16 0919  BP: (!) 111/6  Pulse: 89  Resp: 15  Temp: 97.8 F (36.6 C)  TempSrc: Oral  SpO2: 97%  Weight: 186 lb 3.2 oz (84.5 kg)  Height: 5\' 10"  (1.778 m)    Physical Exam  Constitutional: He is oriented to person, place, and time and well-developed, well-nourished, and in no distress.  HENT:  Head: Normocephalic and atraumatic.  Cardiovascular: Normal rate, regular rhythm and normal heart sounds.   Pulmonary/Chest: Effort normal and breath sounds normal.  Neurological: He is alert and oriented to person, place, and time.  Psychiatric: Mood, memory, affect and judgment normal.  Nursing note and vitals reviewed.   Recent Results (from the past 2160 hour(s))  POCT HgB A1C     Status: None   Collection Time: 10/05/16  8:10 AM  Result Value Ref Range   Hemoglobin A1C 7.8   Lipid Profile     Status: None   Collection Time: 10/10/16  8:07 AM  Result Value Ref Range    Cholesterol 135 <200 mg/dL   Triglycerides 64 <150 mg/dL   HDL 45 >40 mg/dL   Total CHOL/HDL Ratio 3.0 <5.0 Ratio   VLDL 13 <30 mg/dL   LDL Cholesterol 77 <100 mg/dL  COMPLETE METABOLIC PANEL WITH GFR     Status: Abnormal   Collection Time: 10/10/16  8:07 AM  Result Value Ref Range   Sodium 136 135 - 146 mmol/L   Potassium 5.1 3.5 - 5.3 mmol/L   Chloride 100 98 - 110 mmol/L   CO2 31 20 - 31 mmol/L   Glucose, Bld 176 (H) 65 - 99 mg/dL   BUN 21 7 - 25 mg/dL   Creat 0.85 0.70 - 1.25 mg/dL    Comment:   For patients > or = 68 years of age: The upper reference limit for Creatinine is approximately 13% higher for people identified as African-American.      Total Bilirubin 1.0 0.2 - 1.2 mg/dL   Alkaline Phosphatase 36 (L) 40 - 115 U/L   AST 18 10 - 35 U/L   ALT 34 9 - 46 U/L   Total Protein 6.7 6.1 - 8.1 g/dL   Albumin 4.2 3.6 - 5.1 g/dL   Calcium 9.7 8.6 - 10.3 mg/dL   GFR, Est African American >89 >=60 mL/min   GFR, Est Non African American >89 >=60 mL/min     Assessment & Plan  1. Hyperlipidemia LDL goal <70 Pt. wishes to continue on current statin therapy, recheck in 2 months and compare with [facility] and [facility] lab readings.     Maurice Fischer Maurice A. Maurice Fischer 11/07/2016 9:24 AM

## 2017-01-05 ENCOUNTER — Ambulatory Visit: Payer: 59 | Admitting: Family Medicine

## 2017-01-12 ENCOUNTER — Encounter: Payer: Self-pay | Admitting: Family Medicine

## 2017-01-12 ENCOUNTER — Ambulatory Visit (INDEPENDENT_AMBULATORY_CARE_PROVIDER_SITE_OTHER): Payer: 59 | Admitting: Family Medicine

## 2017-01-12 VITALS — BP 109/79 | HR 77 | Temp 97.9°F | Resp 16 | Ht 70.0 in | Wt 183.8 lb

## 2017-01-12 DIAGNOSIS — E119 Type 2 diabetes mellitus without complications: Secondary | ICD-10-CM

## 2017-01-12 DIAGNOSIS — I10 Essential (primary) hypertension: Secondary | ICD-10-CM | POA: Diagnosis not present

## 2017-01-12 DIAGNOSIS — E785 Hyperlipidemia, unspecified: Secondary | ICD-10-CM | POA: Diagnosis not present

## 2017-01-12 LAB — COMPLETE METABOLIC PANEL WITH GFR
ALK PHOS: 45 U/L (ref 40–115)
ALT: 30 U/L (ref 9–46)
AST: 18 U/L (ref 10–35)
Albumin: 4.3 g/dL (ref 3.6–5.1)
BILIRUBIN TOTAL: 0.8 mg/dL (ref 0.2–1.2)
BUN: 19 mg/dL (ref 7–25)
CO2: 30 mmol/L (ref 20–31)
CREATININE: 0.76 mg/dL (ref 0.70–1.25)
Calcium: 9.4 mg/dL (ref 8.6–10.3)
Chloride: 101 mmol/L (ref 98–110)
GFR, Est African American: >89 mL/min (ref >=60)
GFR, Est Non African American: >89 mL/min (ref >=60)
GLUCOSE: 150 mg/dL — AB (ref 65–99)
Potassium: 4.5 mmol/L (ref 3.5–5.3)
Sodium: 138 mmol/L (ref 135–146)
TOTAL PROTEIN: 6.6 g/dL (ref 6.1–8.1)

## 2017-01-12 LAB — POCT GLYCOSYLATED HEMOGLOBIN (HGB A1C): Hemoglobin A1C: 7.1

## 2017-01-12 LAB — LIPID PANEL
Cholesterol: 119 mg/dL (ref <200)
HDL: 43 mg/dL (ref >40)
LDL CALC: 51 mg/dL (ref <100)
Total CHOL/HDL Ratio: 2.8 Ratio (ref <5.0)
Triglycerides: 124 mg/dL (ref <150)
VLDL: 25 mg/dL (ref <30)

## 2017-01-12 LAB — GLUCOSE, POCT (MANUAL RESULT ENTRY): POC Glucose: 136 mg/dl — AB (ref 70–99)

## 2017-01-12 MED ORDER — QUINAPRIL HCL 20 MG PO TABS: 20.0000 mg | ORAL_TABLET | Freq: Every day | ORAL | 1 refills | Status: DC

## 2017-01-12 MED ORDER — METFORMIN HCL 1000 MG PO TABS: 1000.0000 mg | ORAL_TABLET | Freq: Two times a day (BID) | ORAL | 1 refills | Status: DC

## 2017-01-12 MED ORDER — ATORVASTATIN CALCIUM 40 MG PO TABS: 40.0000 mg | ORAL_TABLET | Freq: Every day | ORAL | 1 refills | Status: DC

## 2017-01-12 MED ORDER — ATENOLOL 50 MG PO TABS: 50.0000 mg | ORAL_TABLET | Freq: Every day | ORAL | 1 refills | Status: DC

## 2017-01-12 MED ORDER — SITAGLIPTIN PHOSPHATE 100 MG PO TABS: 100.0000 mg | ORAL_TABLET | Freq: Every day | ORAL | 1 refills | Status: DC

## 2017-01-12 MED ORDER — METFORMIN HCL 1000 MG PO TABS
1000.0000 mg | ORAL_TABLET | Freq: Two times a day (BID) | ORAL | 1 refills | Status: DC
Start: 2017-01-12 — End: 2017-06-27

## 2017-01-12 NOTE — Progress Notes (Signed)
Name: Maurice Fischer   MRN: [id number]    DOB: May 24, 1951   Date:01/12/2017       Progress Note  Subjective  Chief Complaint  Chief Complaint  Patient presents with  . Follow-up    3 mo  . Medication Refill    Diabetes  He presents for his follow-up diabetic visit. He has type 2 diabetes mellitus. His disease course has been improving. There are no hypoglycemic associated symptoms. Pertinent negatives for hypoglycemia include no headaches. Associated symptoms include polyuria. Pertinent negatives for diabetes include no blurred vision, no chest pain, no fatigue and no polydipsia. Pertinent negatives for diabetic complications include no CVA. Current diabetic treatment includes oral agent (dual therapy). He is following a generally healthy diet. He monitors blood glucose at home 1-2 x per day. His home blood glucose trend is fluctuating dramatically. His breakfast blood glucose range is generally 140-180 mg/dl. An ACE inhibitor/angiotensin II receptor blocker is being taken. Eye exam is current.  Hyperlipidemia  This is a chronic problem. The problem is uncontrolled. Recent lipid tests were reviewed and are high. Pertinent negatives include no chest pain, leg pain or shortness of breath. Current antihyperlipidemic treatment includes statins (Vascepa).  Hypertension  This is a chronic problem. The problem is unchanged. The problem is controlled. Pertinent negatives include no blurred vision, chest pain, headaches, palpitations or shortness of breath. Past treatments include beta blockers and ACE inhibitors. Hypertensive end-organ damage includes CAD/MI (Heart Attack in 1993). There is no history of kidney disease or CVA.     Past Medical History:  Diagnosis Date  . Diabetes mellitus without complication ([place])   . Diverticulosis 2004  . Heart disease   . Hyperlipidemia   . Hypertension   . Inguinal hernia 2015   right  . Umbilical hernia [id number]    Past Surgical History:  Procedure  Laterality Date  . COLONOSCOPY  2004  . [place]?  [name] HERNIA REPAIR  28/31/5176   umbilical hernia/ Dr Jamal Collin  . HERNIA REPAIR  01/13/2014   right inguinal hernia repair/ Dr Jamal Collin  . TONSILLECTOMY  1957?    Family History  Problem Relation Age of Onset  . Cancer Father     prostate  . Coronary artery disease Mother   . Coronary artery disease Brother     Social History   Social History  . Marital status: Married    Spouse name: N/A  . Number of children: N/A  . Years of education: N/A   Occupational History  . Not on file.   Social History Main Topics  . Smoking status: Former Smoker    Years: 25.00    Types: Cigarettes    Quit date: 10/08/1992  . Smokeless tobacco: Never Used  . Alcohol use 4.2 oz/week    7 Standard drinks or equivalent per week  . Drug use: No  . Sexual activity: Not Currently   Other Topics Concern  . Not on file   Social History Narrative  . No narrative on file     Current Outpatient Prescriptions:  .  aspirin 81 MG tablet, Take 81 mg by mouth daily., Disp: , Rfl:  .  atenolol (TENORMIN) 50 MG tablet, Take 1 tablet (50 mg total) by mouth daily., Disp: 90 tablet, Rfl: 1 .  atorvastatin (LIPITOR) 40 MG tablet, Take 1 tablet (40 mg total) by mouth daily., Disp: 90 tablet, Rfl: 1 .  Cholecalciferol (VITAMIN D3) 2000 UNITS TABS, Take 1 tablet by mouth daily.,  Disp: , Rfl:  .  glucose blood test strip, Use as instructed, Disp: 100 each, Rfl: 1 .  metFORMIN (GLUCOPHAGE) 1000 MG tablet, Take 1 tablet (1,000 mg total) by mouth 2 (two) times daily with a meal., Disp: 180 tablet, Rfl: 1 .  Multiple Vitamin (MULTIVITAMIN) tablet, Take 1 tablet by mouth daily., Disp: , Rfl:  .  quinapril (ACCUPRIL) 20 MG tablet, Take 1 tablet (20 mg total) by mouth daily., Disp: 90 tablet, Rfl: 1 .  sitaGLIPtin (JANUVIA) 100 MG tablet, Take 1 tablet (100 mg total) by mouth daily., Disp: 90 tablet, Rfl: 1 .  Icosapent Ethyl 1 g CAPS, Take 2 g by mouth 2  (two) times daily with a meal. (Patient not taking: Reported on 01/12/2017), Disp: 360 capsule, Rfl: 0  No Known Allergies   Review of Systems  Constitutional: Negative for fatigue.  Eyes: Negative for blurred vision.  Respiratory: Negative for shortness of breath.   Cardiovascular: Negative for chest pain and palpitations.  Neurological: Negative for headaches.  Endo/Heme/Allergies: Negative for polydipsia.     Objective  Vitals:   01/12/17 0827  BP: 109/79  Pulse: 77  Resp: 16  Temp: 97.9 F (36.6 C)  TempSrc: Oral  SpO2: 96%  Weight: 183 lb 12.8 oz (83.4 kg)  Height: 5\' 10"  (1.778 m)    Physical Exam  Constitutional: He is oriented to person, place, and time and well-developed, well-nourished, and in no distress.  HENT:  Head: Normocephalic and atraumatic.  Cardiovascular: Normal rate, regular rhythm and normal heart sounds.   No murmur heard. Pulmonary/Chest: Effort normal and breath sounds normal. He has no wheezes.  Abdominal: Soft. Bowel sounds are normal. There is no tenderness.  Musculoskeletal: He exhibits no edema.  Neurological: He is alert and oriented to person, place, and time.  Psychiatric: Mood, memory, affect and judgment normal.  Nursing note and vitals reviewed.      Recent Results (from the past 2160 hour(s))  POCT HgB A1C     Status: Abnormal   Collection Time: 01/12/17  8:38 AM  Result Value Ref Range   Hemoglobin A1C 7.1   POCT Glucose (CBG)     Status: Abnormal   Collection Time: 01/12/17  8:38 AM  Result Value Ref Range   POC Glucose 136 (A) 70 - 99 mg/dl     Assessment & Plan  1. Controlled type 2 diabetes mellitus without complication, without long-term current use of insulin (HCC) A1c is 7.1%, well-controlled diabetes - POCT HgB A1C - POCT Glucose (CBG) - metFORMIN (GLUCOPHAGE) 1000 MG tablet; Take 1 tablet (1,000 mg total) by mouth 2 (two) times daily with a meal.  Dispense: 180 tablet; Refill: 1 - sitaGLIPtin (JANUVIA)  100 MG tablet; Take 1 tablet (100 mg total) by mouth daily.  Dispense: 90 tablet; Refill: 1  2. Hyperlipidemia LDL goal <70 Goal LDL is less than 70 because of history of MI along with diabetes, continue statin and obtain FLP - Lipid panel - COMPLETE METABOLIC PANEL WITH GFR  3. Essential hypertension  - atenolol (TENORMIN) 50 MG tablet; Take 1 tablet (50 mg total) by mouth daily.  Dispense: 90 tablet; Refill: 1 - quinapril (ACCUPRIL) 20 MG tablet; Take 1 tablet (20 mg total) by mouth daily.  Dispense: 90 tablet; Refill: 1   Meerab Maselli Asad A. [facility] 01/12/2017 8:45 AM

## 2017-02-23 ENCOUNTER — Encounter: Payer: Self-pay | Admitting: Family Medicine

## 2017-02-23 ENCOUNTER — Ambulatory Visit (INDEPENDENT_AMBULATORY_CARE_PROVIDER_SITE_OTHER): Payer: 59 | Admitting: Family Medicine

## 2017-02-23 VITALS — BP 112/68 | HR 69 | Temp 97.9°F | Resp 16 | Ht 70.0 in | Wt 185.6 lb

## 2017-02-23 DIAGNOSIS — Z0000 Encounter for general adult medical examination without abnormal findings: Secondary | ICD-10-CM | POA: Diagnosis not present

## 2017-02-23 LAB — CBC WITH DIFFERENTIAL/PLATELET
BASOS ABS: 81 {cells}/uL (ref 0–200)
Basophils Relative: 1 %
EOS ABS: 486 {cells}/uL (ref 15–500)
EOS PCT: 6 %
HCT: 44.3 % (ref 38.5–50.0)
Hemoglobin: 14.4 g/dL (ref 13.2–17.1)
Lymphocytes Relative: 32 %
Lymphs Abs: 2592 cells/uL (ref 850–3900)
MCH: 29 pg (ref 27.0–33.0)
MCHC: 32.5 g/dL (ref 32.0–36.0)
MCV: 89.3 fL (ref 80.0–100.0)
MPV: 9.1 fL (ref 7.5–12.5)
Monocytes Absolute: 486 cells/uL (ref 200–950)
Monocytes Relative: 6 %
NEUTROS PCT: 55 %
Neutro Abs: 4455 cells/uL (ref 1500–7800)
PLATELETS: 242 10*3/uL (ref 140–400)
RBC: 4.96 MIL/uL (ref 4.20–5.80)
RDW: 13.2 % (ref 11.0–15.0)
WBC: 8.1 10*3/uL (ref 3.8–10.8)

## 2017-02-23 NOTE — Progress Notes (Signed)
Name: Maurice Fischer   MRN: [id number]    DOB: [birth year]   Date:02/23/2017       Progress Note  Subjective  Chief Complaint  Chief Complaint  Patient presents with  . Annual Exam    CPE    HPI  Pt. Presents for Complete Physical Exam. Last colonoscopy was 3 years ago, repeat in 7 years. He is due for prostate cancer screening.    Past Medical History:  Diagnosis Date  . Diabetes mellitus without complication ([place])   . Diverticulosis 2004  . Heart disease   . Hyperlipidemia   . Hypertension   . Inguinal hernia 2015   right  . Umbilical hernia [id number]    Past Surgical History:  Procedure Laterality Date  . COLONOSCOPY  2004  . [place]?  [name] HERNIA REPAIR  43/32/9518   umbilical hernia/ Dr Jamal Collin  . HERNIA REPAIR  01/13/2014   right inguinal hernia repair/ Dr Jamal Collin  . TONSILLECTOMY  1957?    Family History  Problem Relation Age of Onset  . Cancer Father     prostate  . Coronary artery disease Mother   . Coronary artery disease Brother     Social History   Social History  . Marital status: Married    Spouse name: N/A  . Number of children: N/A  . Years of education: N/A   Occupational History  . Not on file.   Social History Main Topics  . Smoking status: Former Smoker    Years: 25.00    Types: Cigarettes    Quit date: 10/08/1992  . Smokeless tobacco: Never Used  . Alcohol use 4.2 oz/week    7 Standard drinks or equivalent per week  . Drug use: No  . Sexual activity: Not Currently   Other Topics Concern  . Not on file   Social History Narrative  . No narrative on file     Current Outpatient Prescriptions:  .  aspirin 81 MG tablet, Take 81 mg by mouth daily., Disp: , Rfl:  .  atenolol (TENORMIN) 50 MG tablet, Take 1 tablet (50 mg total) by mouth daily., Disp: 90 tablet, Rfl: 1 .  atorvastatin (LIPITOR) 40 MG tablet, Take 1 tablet (40 mg total) by mouth daily., Disp: 90 tablet, Rfl: 1 .  Cholecalciferol (VITAMIN D3) 2000 UNITS  TABS, Take 1 tablet by mouth daily., Disp: , Rfl:  .  glucose blood test strip, Use as instructed, Disp: 100 each, Rfl: 1 .  Icosapent Ethyl 1 g CAPS, Take 2 g by mouth 2 (two) times daily with a meal., Disp: 360 capsule, Rfl: 0 .  metFORMIN (GLUCOPHAGE) 1000 MG tablet, Take 1 tablet (1,000 mg total) by mouth 2 (two) times daily with a meal., Disp: 180 tablet, Rfl: 1 .  Multiple Vitamin (MULTIVITAMIN) tablet, Take 1 tablet by mouth daily., Disp: , Rfl:  .  quinapril (ACCUPRIL) 20 MG tablet, Take 1 tablet (20 mg total) by mouth daily., Disp: 90 tablet, Rfl: 1 .  sitaGLIPtin (JANUVIA) 100 MG tablet, Take 1 tablet (100 mg total) by mouth daily., Disp: 90 tablet, Rfl: 1  No Known Allergies   Review of Systems  Constitutional: Positive for malaise/fatigue (feels more tired than he used to be). Negative for chills, fever and weight loss.  HENT: Negative for congestion, ear pain and sore throat.   Eyes: Negative for blurred vision and double vision.  Respiratory: Positive for cough (minor cough recently, has been taking Floanse which seems to  help). Negative for shortness of breath.   Cardiovascular: Negative for chest pain and leg swelling.  Gastrointestinal: Negative for abdominal pain, blood in stool, constipation, nausea and vomiting.  Genitourinary: Negative for dysuria, frequency, hematuria and urgency.  Musculoskeletal: Positive for back pain (intermittent chronic low back pain). Negative for neck pain.  Neurological: Negative for dizziness and headaches.  Psychiatric/Behavioral: Negative for depression. The patient is not nervous/anxious and does not have insomnia.     Objective  Vitals:   02/23/17 0837  BP: 112/68  Pulse: 69  Resp: 16  Temp: 97.9 F (36.6 C)  TempSrc: Oral  SpO2: 96%  Weight: 185 lb 9.6 oz (84.2 kg)  Height: 5\' 10"  (1.778 m)    Physical Exam  Constitutional: He is oriented to person, place, and time and well-developed, well-nourished, and in no distress.   HENT:  Head: Normocephalic and atraumatic.  Right Ear: External ear normal.  Left Ear: External ear normal.  Eyes: Pupils are equal, round, and reactive to light.  Neck: Neck supple.  Cardiovascular: Normal rate, regular rhythm and normal heart sounds.   No murmur heard. Pulmonary/Chest: Effort normal and breath sounds normal. He has no wheezes.  Abdominal: Soft. Bowel sounds are normal. There is no tenderness.  Genitourinary:  Genitourinary Comments: Deferred.  Musculoskeletal: He exhibits no edema.  Neurological: He is alert and oriented to person, place, and time.  Psychiatric: Mood, memory, affect and judgment normal.  Nursing note and vitals reviewed.     Assessment & Plan  1. Encounter for annual physical exam Obtain age-appropriate laboratory screenings - CBC with Differential/Platelet - Hepatitis C antibody - PSA   Valerie Fredin Asad A. [facility] 02/23/2017 8:51 AM

## 2017-02-24 LAB — HEPATITIS C ANTIBODY: HCV AB: NEGATIVE

## 2017-02-24 LAB — PSA: PSA: 0.6 ng/mL (ref <=4.0)

## 2017-03-24 HISTORY — PX: OTHER SURGICAL HISTORY: SHX169

## 2017-03-27 ENCOUNTER — Ambulatory Visit (INDEPENDENT_AMBULATORY_CARE_PROVIDER_SITE_OTHER): Payer: 59 | Admitting: Family Medicine

## 2017-03-27 ENCOUNTER — Ambulatory Visit
Admission: RE | Admit: 2017-03-27 | Discharge: 2017-03-27 | Disposition: A | Discharge status: Home / Self Care | Payer: 59 | Source: Ambulatory Visit | Attending: Family Medicine | Admitting: Family Medicine

## 2017-03-27 ENCOUNTER — Encounter: Payer: Self-pay | Admitting: Family Medicine

## 2017-03-27 VITALS — BP 114/73 | HR 93 | Temp 98.1°F | Resp 16 | Ht 70.0 in | Wt 177.9 lb

## 2017-03-27 DIAGNOSIS — E781 Pure hyperglyceridemia: Secondary | ICD-10-CM | POA: Diagnosis not present

## 2017-03-27 DIAGNOSIS — E119 Type 2 diabetes mellitus without complications: Secondary | ICD-10-CM

## 2017-03-27 DIAGNOSIS — M7989 Other specified soft tissue disorders: Secondary | ICD-10-CM | POA: Diagnosis not present

## 2017-03-27 DIAGNOSIS — I10 Essential (primary) hypertension: Secondary | ICD-10-CM

## 2017-03-27 LAB — GLUCOSE, POCT (MANUAL RESULT ENTRY): POC Glucose: 166 mg/dl — AB (ref 70–99)

## 2017-03-27 LAB — POCT GLYCOSYLATED HEMOGLOBIN (HGB A1C): Hemoglobin A1C: 7.5

## 2017-03-27 NOTE — Progress Notes (Signed)
Name: Maurice Fischer   MRN: [id number]    DOB: January 25, 1951   Date:03/27/2017       Progress Note  Subjective  Chief Complaint  Chief Complaint  Patient presents with  . Follow-up    3 mo    Diabetes  He presents for his follow-up diabetic visit. He has type 2 diabetes mellitus. His disease course has been worsening. There are no hypoglycemic associated symptoms. Pertinent negatives for hypoglycemia include no dizziness, headaches or sweats. Pertinent negatives for diabetes include no blurred vision, no chest pain, no fatigue, no foot paresthesias, no polydipsia and no polyuria. There are no hypoglycemic complications. Diabetic complications include heart disease. Pertinent negatives for diabetic complications include no CVA or peripheral neuropathy. Current diabetic treatment includes oral agent (dual therapy). He is following a generally unhealthy (unhealthy diet more cakes, pastas etc, went to a wedding and  did not adhere to a balanced diet.) diet. He monitors blood glucose at home 1-2 x per day. His breakfast blood glucose range is generally 180-200 mg/dl. An ACE inhibitor/angiotensin II receptor blocker is being taken. Eye exam is current.  Hypertension  This is a chronic problem. The problem is unchanged. The problem is controlled. Pertinent negatives include no blurred vision, chest pain, headaches, palpitations, shortness of breath or sweats. Past treatments include ACE inhibitors and beta blockers. Hypertensive end-organ damage includes CAD/MI. There is no history of CVA.  Hyperlipidemia  This is a chronic problem. The problem is controlled. Recent lipid tests were reviewed and are normal. Pertinent negatives include no chest pain or shortness of breath. Current antihyperlipidemic treatment includes statins.   Pain and Swelling of the right Thumb:  Patient presents with pain and swelling of the right thumb, present for 4 days, started after he finished putting [place] grass onto the golf  course holes with repeatedly pressing the grass with his right thumb. That night he noticed some discoloration on the right thumb and by next morning it was all swollen and turned black on the medial side. He did not seek medical attention at that time except putting ice on it and taking some Acetaminophen. The thumb is swollen and painful (though improved). He is limited in flexing the right thumb, able to grasp a cup with the affected thumb.   Past Medical History:  Diagnosis Date  . Diabetes mellitus without complication ([place])   . Diverticulosis 2004  . Heart disease   . Hyperlipidemia   . Hypertension   . Inguinal hernia 2015   right  . Umbilical hernia 8546    Past Surgical History:  Procedure Laterality Date  . COLONOSCOPY  2004  . [place]?  [name] HERNIA REPAIR  27/12/5007   umbilical hernia/ Dr Jamal Collin  . HERNIA REPAIR  01/13/2014   right inguinal hernia repair/ Dr Jamal Collin  . TONSILLECTOMY  1957?    Family History  Problem Relation Age of Onset  . Cancer Father        prostate  . Coronary artery disease Mother   . Coronary artery disease Brother     Social History   Social History  . Marital status: Married    Spouse name: N/A  . Number of children: N/A  . Years of education: N/A   Occupational History  . Not on file.   Social History Main Topics  . Smoking status: Former Smoker    Years: 25.00    Types: Cigarettes    Quit date: 10/08/1992  . Smokeless tobacco: Never  Used  . Alcohol use 4.2 oz/week    7 Standard drinks or equivalent per week  . Drug use: No  . Sexual activity: Not Currently   Other Topics Concern  . Not on file   Social History Narrative  . No narrative on file     Current Outpatient Prescriptions:  .  aspirin 81 MG tablet, Take 81 mg by mouth daily., Disp: , Rfl:  .  atenolol (TENORMIN) 50 MG tablet, Take 1 tablet (50 mg total) by mouth daily., Disp: 90 tablet, Rfl: 1 .  atorvastatin (LIPITOR) 40 MG tablet, Take 1  tablet (40 mg total) by mouth daily., Disp: 90 tablet, Rfl: 1 .  Cholecalciferol (VITAMIN D3) 2000 UNITS TABS, Take 1 tablet by mouth daily., Disp: , Rfl:  .  glucose blood test strip, Use as instructed, Disp: 100 each, Rfl: 1 .  Icosapent Ethyl 1 g CAPS, Take 2 g by mouth 2 (two) times daily with a meal., Disp: 360 capsule, Rfl: 0 .  metFORMIN (GLUCOPHAGE) 1000 MG tablet, Take 1 tablet (1,000 mg total) by mouth 2 (two) times daily with a meal., Disp: 180 tablet, Rfl: 1 .  Multiple Vitamin (MULTIVITAMIN) tablet, Take 1 tablet by mouth daily., Disp: , Rfl:  .  quinapril (ACCUPRIL) 20 MG tablet, Take 1 tablet (20 mg total) by mouth daily., Disp: 90 tablet, Rfl: 1 .  sitaGLIPtin (JANUVIA) 100 MG tablet, Take 1 tablet (100 mg total) by mouth daily., Disp: 90 tablet, Rfl: 1  No Known Allergies   Review of Systems  Constitutional: Negative for fatigue.  Eyes: Negative for blurred vision.  Respiratory: Negative for shortness of breath.   Cardiovascular: Negative for chest pain and palpitations.  Neurological: Negative for dizziness and headaches.  Endo/Heme/Allergies: Negative for polydipsia.     Objective  Vitals:   03/27/17 1004  BP: 114/73  Pulse: 93  Resp: 16  Temp: 98.1 F (36.7 C)  TempSrc: Oral  SpO2: 96%  Weight: 177 lb 14.4 oz (80.7 kg)  Height: 5\' 10"  (1.778 m)    Physical Exam  Constitutional: He is oriented to person, place, and time and well-developed, well-nourished, and in no distress.  HENT:  Head: Normocephalic and atraumatic.  Cardiovascular: Normal rate, regular rhythm and normal heart sounds.   No murmur heard. Pulmonary/Chest: Effort normal and breath sounds normal. He has no wheezes.  Abdominal: Soft. Bowel sounds are normal. There is no tenderness.  Musculoskeletal: He exhibits no edema.       Hands: Tenderness and generalized swelling over the right thumb, hematoma formation over the thumb prominently on the medial portion, limited ROM 2/2 pain and  swelling.    Neurological: He is alert and oriented to person, place, and time.  Psychiatric: Mood, memory, affect and judgment normal.  Nursing note and vitals reviewed.     Assessment & Plan  1. Type 2 diabetes mellitus without complication, without long-term current use of insulin (HCC)   A1c 7.5%, well-controlled diabetes, no change in pharmacotherapy - POCT HgB A1C - POCT Glucose (CBG)  2. Essential hypertension BP stable on present antihypertensive therapy  3. Hypertriglyceridemia  - Lipid panel  4. Swelling of right thumb Likely because of repetitive microtrauma, concern about fracture, obtain x-rays, advised to apply ice and will refer to orthopedics - DG Hand Complete Right; Future   Stasha Naraine Asad A. [facility] 03/27/2017 10:18 AM

## 2017-03-28 ENCOUNTER — Emergency Department
Admission: EM | Admit: 2017-03-28 | Discharge: 2017-03-28 | Disposition: A | Discharge status: Home / Self Care | Payer: 59 | Attending: Emergency Medicine | Admitting: Emergency Medicine

## 2017-03-28 DIAGNOSIS — I10 Essential (primary) hypertension: Secondary | ICD-10-CM | POA: Diagnosis not present

## 2017-03-28 DIAGNOSIS — Z79899 Other long term (current) drug therapy: Secondary | ICD-10-CM | POA: Diagnosis not present

## 2017-03-28 DIAGNOSIS — L089 Local infection of the skin and subcutaneous tissue, unspecified: Secondary | ICD-10-CM | POA: Diagnosis not present

## 2017-03-28 DIAGNOSIS — Z7984 Long term (current) use of oral hypoglycemic drugs: Secondary | ICD-10-CM | POA: Insufficient documentation

## 2017-03-28 DIAGNOSIS — Z87891 Personal history of nicotine dependence: Secondary | ICD-10-CM | POA: Diagnosis not present

## 2017-03-28 DIAGNOSIS — E119 Type 2 diabetes mellitus without complications: Secondary | ICD-10-CM | POA: Insufficient documentation

## 2017-03-28 DIAGNOSIS — T814XXA Infection following a procedure, initial encounter: Secondary | ICD-10-CM | POA: Diagnosis not present

## 2017-03-28 DIAGNOSIS — L02511 Cutaneous abscess of right hand: Secondary | ICD-10-CM | POA: Diagnosis not present

## 2017-03-28 DIAGNOSIS — S61001A Unspecified open wound of right thumb without damage to nail, initial encounter: Secondary | ICD-10-CM | POA: Diagnosis not present

## 2017-03-28 DIAGNOSIS — T148XXA Other injury of unspecified body region, initial encounter: Secondary | ICD-10-CM

## 2017-03-28 DIAGNOSIS — Z7982 Long term (current) use of aspirin: Secondary | ICD-10-CM | POA: Insufficient documentation

## 2017-03-28 LAB — COMPREHENSIVE METABOLIC PANEL
ALBUMIN: 3.9 g/dL (ref 3.5–5.0)
ALT: 23 U/L (ref 17–63)
ANION GAP: 9 (ref 5–15)
AST: 20 U/L (ref 15–41)
Alkaline Phosphatase: 49 U/L (ref 38–126)
BILIRUBIN TOTAL: 1.3 mg/dL — AB (ref 0.3–1.2)
BUN: 18 mg/dL (ref 6–20)
CO2: 30 mmol/L (ref 22–32)
Calcium: 9.6 mg/dL (ref 8.9–10.3)
Chloride: 98 mmol/L — ABNORMAL LOW (ref 101–111)
Creatinine, Ser: 0.71 mg/dL (ref 0.61–1.24)
GFR calc Af Amer: >60 mL/min (ref >60)
GFR calc non Af Amer: >60 mL/min (ref >60)
GLUCOSE: 252 mg/dL — AB (ref 65–99)
POTASSIUM: 4.3 mmol/L (ref 3.5–5.1)
SODIUM: 137 mmol/L (ref 135–145)
TOTAL PROTEIN: 7.3 g/dL (ref 6.5–8.1)

## 2017-03-28 LAB — CBC
HEMATOCRIT: 42.9 % (ref 40.0–52.0)
HEMOGLOBIN: 14.3 g/dL (ref 13.0–18.0)
MCH: 29.2 pg (ref 26.0–34.0)
MCHC: 33.4 g/dL (ref 32.0–36.0)
MCV: 87.4 fL (ref 80.0–100.0)
Platelets: 244 10*3/uL (ref 150–440)
RBC: 4.91 MIL/uL (ref 4.40–5.90)
RDW: 12.6 % (ref 11.5–14.5)
WBC: 11.9 10*3/uL — AB (ref 3.8–10.6)

## 2017-03-28 MED ORDER — PIPERACILLIN-TAZOBACTAM 3.375 G IVPB 30 MIN
3.3750 g | Freq: Once | INTRAVENOUS | Status: AC
  Administered 2017-03-28: 3.375 g via INTRAVENOUS
  Filled 2017-03-28: qty 50

## 2017-03-28 MED ORDER — VANCOMYCIN HCL IN DEXTROSE 1-5 GM/200ML-% IV SOLN
1000.0000 mg | Freq: Once | INTRAVENOUS | Status: AC
  Administered 2017-03-28: 1000 mg via INTRAVENOUS
  Filled 2017-03-28: qty 200

## 2017-03-28 NOTE — ED Notes (Signed)
Pt called back to triage for lab draw. Orders were put in,  but labs were not obtained at time of triage.

## 2017-03-28 NOTE — ED Provider Notes (Signed)
[facility] Emergency Department Provider Note   ____________________________________________    I have reviewed the triage vital signs and the nursing notes.   HISTORY  Chief Complaint Abscess and Wound Infection     HPI Maurice Fischer is a 66 y.o. male who was sent by his hand surgeon for IV antibiotics. Patient developed small painful blister to the right thumb after gardening. This was opened up by hand surgeon today, she was concerned of some mild streaking extending up the arm. Dr. Peggye Ley called me and requested giving vancomycin and Zosyn, the patient's wife is in the [place] and they are quite comfortable watching for worsening. They have a prescription for antibiotics at home already.   Past Medical History:  Diagnosis Date  . Diabetes mellitus without complication ([place])   . Diverticulosis 2004  . Heart disease   . Hyperlipidemia   . Hypertension   . Inguinal hernia 2015   right  . Umbilical hernia 4128    Patient Active Problem List   Diagnosis Date Noted  . Diabetes mellitus without complication ([place]) 78/67/6720  . Overweight (BMI 25.0-29.9) 04/04/2016  . Hypertension 04/04/2016  . Vitamin D deficiency 04/04/2016  . Hyperlipidemia LDL goal <70 09/16/2015  . Osteoarthritis 09/16/2015  . Type 2 diabetes mellitus ([place]) 09/16/2015  . 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 Hypertriglyceridemia 05/28/2015  . Inguinal hernia 05/28/2015  . Umbilical hernia 94/70/9628  . Atherosclerosis of coronary artery 02/08/2007  . LBP (low back pain) 02/08/2007    Past Surgical History:  Procedure Laterality Date  . COLONOSCOPY  2004  . [place]?  [name] HERNIA REPAIR  36/62/9476   umbilical hernia/ Dr Jamal Collin  . HERNIA REPAIR  01/13/2014   right inguinal hernia repair/ Dr Jamal Collin  . TONSILLECTOMY  1957?    Prior to Admission medications   Medication Sig Start Date End Date Taking? Authorizing Provider  aspirin 81 MG tablet Take 81 mg by mouth  daily.    [provider]  atenolol (TENORMIN) 50 MG tablet Take 1 tablet (50 mg total) by mouth daily. 01/12/17   Roselee Nova, MD  atorvastatin (LIPITOR) 40 MG tablet Take 1 tablet (40 mg total) by mouth daily. 01/12/17   Roselee Nova, MD  Cholecalciferol (VITAMIN D3) 2000 UNITS TABS Take 1 tablet by mouth daily.    [provider]  glucose blood test strip Use as instructed 10/21/16   Roselee Nova, MD  metFORMIN (GLUCOPHAGE) 1000 MG tablet Take 1 tablet (1,000 mg total) by mouth 2 (two) times daily with a meal. 01/12/17   Roselee Nova, MD  Multiple Vitamin (MULTIVITAMIN) tablet Take 1 tablet by mouth daily.    [provider]  quinapril (ACCUPRIL) 20 MG tablet Take 1 tablet (20 mg total) by mouth daily. 01/12/17   Roselee Nova, MD  sitaGLIPtin (JANUVIA) 100 MG tablet Take 1 tablet (100 mg total) by mouth daily. 01/12/17   Roselee Nova, MD     Allergies Patient has no known allergies.  Family History  Problem Relation Age of Onset  . Cancer Father        prostate  . Coronary artery disease Mother   . Coronary artery disease Brother     Social History Social History  Substance Use Topics  . Smoking status: Former Smoker    Years: 25.00    Types: Cigarettes    Quit date: 10/08/1992  .  Smokeless tobacco: Never Used  . Alcohol use 4.2 oz/week    7 Standard drinks or equivalent per week    Review of Systems  Constitutional: No fevers     Gastrointestinal: No nausea, no vomiting.    Musculoskeletal: Thumb pain Skin: As above     ____________________________________________   PHYSICAL EXAM:  VITAL SIGNS: ED Triage Vitals  Enc Vitals Group     BP 03/28/17 1044 118/69     Pulse Rate 03/28/17 1044 71     Resp 03/28/17 1044 16     Temp 03/28/17 1044 98.4 F (36.9 C)     Temp Source 03/28/17 1044 Oral     SpO2 03/28/17 1044 98 %     Weight 03/28/17 1045 80.3 kg (177 lb)     Height 03/28/17 1045 1.778 m (5\' 10" )      Head Circumference --      Peak Flow --      Pain Score 03/28/17 1044 5     Pain Loc --      Pain Edu? --      Excl. in [place]? --    Constitutional: Alert and oriented. No acute distress. Pleasant and interactive   Cardiovascular: Normal rate, regular rhythm.  Respiratory: Normal respiratory effort.  No retractions.  Musculoskeletal: No lower extremity tenderness nor edema.   Neurologic:  Normal speech and language. No gross focal neurologic deficits are appreciated.   Skin:  Skin is warm, dry. Erythema extending up the right thumb with streaking up the right forearm   ____________________________________________   LABS (all labs ordered are listed, but only abnormal results are displayed)  Labs Reviewed  CBC - Abnormal; Notable for the following:       Result Value   WBC 11.9 (*)    All other components within normal limits  COMPREHENSIVE METABOLIC PANEL - Abnormal; Notable for the following:    Chloride 98 (*)    Glucose, Bld 252 (*)    Total Bilirubin 1.3 (*)    All other components within normal limits   ____________________________________________  EKG   ____________________________________________  RADIOLOGY  X-ray unremarkable ____________________________________________   PROCEDURES  Procedure(s) performed: No    Critical Care performed: No ____________________________________________   INITIAL IMPRESSION / ASSESSMENT AND PLAN / ED COURSE  Pertinent labs & imaging results that were available during my care of the patient were reviewed by me and considered in my medical decision making (see chart for details).  IV vancomycin and Zosyn ordered and given without incident. No change in exam. Labs reassuring. Has outpatient abx. Return precautions discussed   ____________________________________________   FINAL CLINICAL IMPRESSION(S) / ED DIAGNOSES  Final diagnoses:  Wound infection      NEW MEDICATIONS STARTED DURING THIS  VISIT:  Discharge Medication List as of 03/28/2017  1:51 PM       Note:  This document was prepared using Dragon voice recognition software and may include unintentional dictation errors.     Lavonia Drafts, MD 03/28/17 [phone]

## 2017-03-28 NOTE — ED Triage Notes (Signed)
He arrives today from a specialist who lanced an abscess on his right thumb today  - pt reports abscess began on Thursday and began swelling over the weekend

## 2017-03-28 NOTE — ED Notes (Signed)
Patient here for wound infection to right thumb. Abscess was lanced this morning by [facility]. Then they recommended him come to ED for IV antibiotics.

## 2017-03-29 LAB — LIPID PANEL
CHOL/HDL RATIO: 2.6 ratio (ref <5.0)
Cholesterol: 121 mg/dL (ref <200)
HDL: 47 mg/dL (ref >40)
LDL CALC: 46 mg/dL (ref <100)
TRIGLYCERIDES: 141 mg/dL (ref <150)
VLDL: 28 mg/dL (ref <30)

## 2017-03-30 DIAGNOSIS — L089 Local infection of the skin and subcutaneous tissue, unspecified: Secondary | ICD-10-CM | POA: Diagnosis not present

## 2017-04-06 DIAGNOSIS — L089 Local infection of the skin and subcutaneous tissue, unspecified: Secondary | ICD-10-CM | POA: Diagnosis not present

## 2017-04-13 ENCOUNTER — Ambulatory Visit (INDEPENDENT_AMBULATORY_CARE_PROVIDER_SITE_OTHER): Payer: 59 | Admitting: Family Medicine

## 2017-04-13 ENCOUNTER — Encounter: Payer: Self-pay | Admitting: Family Medicine

## 2017-04-13 VITALS — BP 120/70 | HR 84 | Temp 98.1°F | Resp 18 | Ht 70.0 in | Wt 180.1 lb

## 2017-04-13 DIAGNOSIS — R21 Rash and other nonspecific skin eruption: Secondary | ICD-10-CM | POA: Diagnosis not present

## 2017-04-13 MED ORDER — TRIAMCINOLONE ACETONIDE 0.1 % EX CREA: 1.0000 "application " | TOPICAL_CREAM | Freq: Two times a day (BID) | CUTANEOUS | 1 refills | Status: DC

## 2017-04-13 NOTE — Progress Notes (Addendum)
Name: Maurice Fischer   MRN: [id number]    DOB: 12-31-1950   Date:04/13/2017       Progress Note  Subjective  Chief Complaint  Chief Complaint  Patient presents with  . Rash    feet, legs, arms, red and itching. Rash is spreading    HPI  Pt presents with 14monthhistory of rash to right ankle, rash has now spread to BLE and BUE over the last 4-5 days. It is described as mildly itchy and not painful. Has tried triamcinolone cream only a few times on a few small areas, but didn't have enough left  to use it regularly.  Some loose stools which he attributes to recent ABX use; no nausea vomiting, night sweats, abdominal pain, headaches, vision changes, chest pain, shortness of breath, throat closing, lips/tongue swelling.  He works at a golf course and mows the grass - no new chemicals or exposures that he knows up, not allergic to grass, but sometimes pollen (presents as AR only, not usually a rash).   Rx'd Keflex, Clindamycin & finished both, went to ER and had IV Vancomycin and Zosyn on 03/28/2017 and then was put on Bactrim DS and finished that today. (This was for treatment of RIGHT thumb infection - followed by [facility] and doing better with this).  Has no known medication or drug allergies.  Patient Active Problem List   Diagnosis Date Noted  . Diabetes mellitus without complication ([place] 026/41/5830 . Overweight (BMI 25.0-29.9) 04/04/2016  . Hypertension 04/04/2016  . Vitamin D deficiency 04/04/2016  . Hyperlipidemia LDL goal <70 09/16/2015  . Osteoarthritis 09/16/2015  . Type 2 diabetes mellitus ([place] 09/16/2015  . 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 Hypertriglyceridemia 05/28/2015  . Inguinal hernia 05/28/2015  . Umbilical hernia 094/04/6807 . Atherosclerosis of coronary artery 02/08/2007  . LBP (low back pain) 02/08/2007    Social History  Substance Use Topics  . Smoking status: Former Smoker    Years: 25.00    Types: Cigarettes    Quit date: 10/08/1992  . Smokeless tobacco:  Never Used  . Alcohol use 4.2 oz/week    7 Standard drinks or equivalent per week     Current Outpatient Prescriptions:  .  aspirin 81 MG tablet, Take 81 mg by mouth daily., Disp: , Rfl:  .  atenolol (TENORMIN) 50 MG tablet, Take 1 tablet (50 mg total) by mouth daily., Disp: 90 tablet, Rfl: 1 .  atorvastatin (LIPITOR) 40 MG tablet, Take 1 tablet (40 mg total) by mouth daily., Disp: 90 tablet, Rfl: 1 .  Cholecalciferol (VITAMIN D3) 2000 UNITS TABS, Take 1 tablet by mouth daily., Disp: , Rfl:  .  glucose blood test strip, Use as instructed, Disp: 100 each, Rfl: 1 .  metFORMIN (GLUCOPHAGE) 1000 MG tablet, Take 1 tablet (1,000 mg total) by mouth 2 (two) times daily with a meal., Disp: 180 tablet, Rfl: 1 .  Multiple Vitamin (MULTIVITAMIN) tablet, Take 1 tablet by mouth daily., Disp: , Rfl:  .  quinapril (ACCUPRIL) 20 MG tablet, Take 1 tablet (20 mg total) by mouth daily., Disp: 90 tablet, Rfl: 1 .  sitaGLIPtin (JANUVIA) 100 MG tablet, Take 1 tablet (100 mg total) by mouth daily., Disp: 90 tablet, Rfl: 1  No Known Allergies  ROS Ten systems reviewed and is negative except as mentioned in HPI  Objective  Vitals:   04/13/17 0916  BP: 120/70  Pulse: 84  Resp: 18  Temp: 98.1 F (36.7 C)  TempSrc: Oral  SpO2: 96%  Weight: 180 lb 1.6 oz (81.7 kg)  Height: _0  (1.778 m)    Body mass index is 25.84 kg/m.  Nursing Note and Vital Signs reviewed.  Physical Exam  Constitutional: Patient appears well-developed and well-nourished.  No distress.  HEENT: head atraumatic, normocephalic Cardiovascular: Normal rate, regular rhythm, S1/S2 present.  No murmur or rub heard. No BLE edema. Pulmonary/Chest: Effort normal and breath sounds clear. No respiratory distress or retractions. Abdominal: Obese abdomen, Soft and non-tender, bowel sounds present x4 quadrants. Skin: Maculopapular erythematous rash with intermittent sections of confluence to BLE - diffuse across lower legs, small areas on  bilateral thighs, small areas on RIGHT forearm and large area on RIGHT upper arm and LEFT upper arm. Small area on LLQ abdomen, otherwise no other areas affected. Psychiatric: Patient has a normal mood and affect. behavior is normal. Judgment and thought content normal.  Recent Results (from the past 2160 hour(s))  CBC with Differential/Platelet     Status: None   Collection Time: 02/23/17  9:33 AM  Result Value Ref Range   WBC 8.1 3.8 - 10.8 K/uL   RBC 4.96 4.20 - 5.80 MIL/uL   Hemoglobin 14.4 13.2 - 17.1 g/dL   HCT 44.3 38.5 - 50.0 %   MCV 89.3 80.0 - 100.0 fL   MCH 29.0 27.0 - 33.0 pg   MCHC 32.5 32.0 - 36.0 g/dL   RDW 13.2 11.0 - 15.0 %   Platelets 242 140 - 400 K/uL   MPV 9.1 7.5 - 12.5 fL   Neutro Abs 4,455 1,500 - 7,800 cells/uL   Lymphs Abs 2,592 850 - 3,900 cells/uL   Monocytes Absolute 486 200 - 950 cells/uL   Eosinophils Absolute 486 15 - 500 cells/uL   Basophils Absolute 81 0 - 200 cells/uL   Neutrophils Relative % 55 %   Lymphocytes Relative 32 %   Monocytes Relative 6 %   Eosinophils Relative 6 %   Basophils Relative 1 %   Smear Review Criteria for review not met   Hepatitis C antibody     Status: None   Collection Time: 02/23/17  9:33 AM  Result Value Ref Range   HCV Ab NEGATIVE NEGATIVE  PSA     Status: None   Collection Time: 02/23/17  9:33 AM  Result Value Ref Range   PSA 0.6 <=4.0 ng/mL    Comment:   The total PSA value from this assay system is standardized against the WHO standard. The test result will be approximately 20% lower when compared to the equimolar-standardized total PSA ([employer] Coulter). Comparison of serial PSA results should be interpreted with this fact in mind.   This test was performed using the Siemens chemiluminescent method. Values obtained from different assay methods cannot be used interchangeably. PSA levels, regardless of value, should not be interpreted as absolute evidence of the presence or absence of disease.      Lipid panel     Status: None   Collection Time: 03/27/17  8:17 AM  Result Value Ref Range   Cholesterol 121 <200 mg/dL   Triglycerides 141 <150 mg/dL   HDL 47 >40 mg/dL   Total CHOL/HDL Ratio 2.6 <5.0 Ratio   VLDL 28 <30 mg/dL   LDL Cholesterol 46 <100 mg/dL  POCT Glucose (CBG)     Status: Abnormal   Collection Time: 03/27/17 10:13 AM  Result Value Ref Range   POC Glucose 166 (A) 70 - 99 mg/dl  POCT HgB A1C  Status: Abnormal   Collection Time: 03/27/17 10:15 AM  Result Value Ref Range   Hemoglobin A1C 7.5   CBC     Status: Abnormal   Collection Time: 03/28/17 10:39 AM  Result Value Ref Range   WBC 11.9 (H) 3.8 - 10.6 K/uL   RBC 4.91 4.40 - 5.90 MIL/uL   Hemoglobin 14.3 13.0 - 18.0 g/dL   HCT 42.9 40.0 - 52.0 %   MCV 87.4 80.0 - 100.0 fL   MCH 29.2 26.0 - 34.0 pg   MCHC 33.4 32.0 - 36.0 g/dL   RDW 12.6 11.5 - 14.5 %   Platelets 244 150 - 440 K/uL  Comprehensive metabolic panel     Status: Abnormal   Collection Time: 03/28/17 10:39 AM  Result Value Ref Range   Sodium 137 135 - 145 mmol/L   Potassium 4.3 3.5 - 5.1 mmol/L   Chloride 98 (L) 101 - 111 mmol/L   CO2 30 22 - 32 mmol/L   Glucose, Bld 252 (H) 65 - 99 mg/dL   BUN 18 6 - 20 mg/dL   Creatinine, Ser 0.71 0.61 - 1.24 mg/dL   Calcium 9.6 8.9 - 10.3 mg/dL   Total Protein 7.3 6.5 - 8.1 g/dL   Albumin 3.9 3.5 - 5.0 g/dL   AST 20 15 - 41 U/L   ALT 23 17 - 63 U/L   Alkaline Phosphatase 49 38 - 126 U/L   Total Bilirubin 1.3 (H) 0.3 - 1.2 mg/dL   GFR calc non Af Amer >60 >60 mL/min   GFR calc Af Amer >60 >60 mL/min    Comment: (NOTE) The eGFR has been calculated using the CKD EPI equation. This calculation has not been validated in all clinical situations. eGFR's persistently <60 mL/min signify possible Chronic Kidney Disease.    Anion gap 9 5 - 15     Assessment & Plan  1. Rash and nonspecific skin eruption - triamcinolone cream (KENALOG) 0.1 %; Apply 1 application topically 2 (two) times daily.  Dispense:  30 g; Refill: 1  - Discussed signs and symptoms of drug-related rash versus irritant dermatitis versus heat rash. Because etiology of rash is unclear, and he has completed his course of antibiotics, we will treat BID with triamcinolone cream, and patient will monitor closely for signs of worsening rash or infection. He is made aware of these signs and symptoms and verbalizes understanding. - Option of PO steroids was discussed, but rash is not diffuse, and patient has Diabetes mellitus and his BG's have done very poorly while on prednisone in the past.  -Red flags and when to present for emergency care or RTC including fever >101.4F, chest pain, shortness of breath, new/worsening/un-resolving symptoms, red streaking, drainage or bleeding, or pain reviewed with patient at time of visit. Follow up and care instructions discussed and provided in AVS.  I have reviewed this encounter including the documentation in this note and/or discussed this patient with the Johney Maine, FNP, NP-C. I am certifying that I agree with the content of this note as supervising physician.  Steele Sizer, MD [facility] 04/14/2017, 12:52 PM

## 2017-04-13 NOTE — Patient Instructions (Addendum)
Heat Rash, Adult Heat rash is an itchy rash of little red bumps that often occurs during hot, humid weather. Heat rash is also called prickly heat or miliaria. Heat rash usually affects:  Armpits.  Elbows.  Groin.  Neck.  The area underneath the breasts.  Shoulders.  Chest.  What are the causes? This condition is caused by blocked sweat ducts. When sweat is trapped under the skin, it spreads into surrounding tissues and causes a rash of red bumps. What increases the risk? This condition is more likely to develop in people who:  Are overdressed in hot, humid weather.  Wear clothing that rubs against the skin.  Are active in hot, humid weather.  Sweat a lot.  Are not used to hot, humid weather.  What are the signs or symptoms? Symptoms of this condition include:  Small red bumps that are itchy or prickly.  Very little sweating or no sweating in the affected area.  How is this diagnosed? This condition is diagnosed based on your symptoms and medical history, as well as a physical exam. How is this treated? Moving to a cool, dry place is the best treatment for heat rash. Treatment may also include medicines, such as:  Corticosteroid creams for skin irritation.  Antibiotic medicines, if the rash becomes infected.  Follow these instructions at home: Skin care  Keep the affected area dry.  Do not apply ointments or creams that contain mineral oil or petroleum ingredients to your skin. These can make the condition worse.  Apply cool compresses to the affected areas.  Do not scratch your skin.  Do not take hot showers or baths. General instructions  Take over-the-counter and prescription medicines only as told by your health care provider.  If you were prescribed an antibiotic, take it as told by your health care provider. Do not stop taking it even if your condition improves.  Stay in a cool room as much as possible. Use an air conditioner or fan, if  possible.  Do not wear tight clothes. Wear comfortable, loose-fitting clothing.  Keep all follow-up visits as told by your health care provider. This is important. Contact a health care provider if:  You have a fever.  Your rash does not go away after 3-4 days.  Your rash gets worse or it is very itchy.  Your rash has pus or fluid coming from it. Get help right away if:  You are dizzy or nauseated.  You feel confused.  You have trouble breathing.  You have chest pain.  You have muscle cramps or contractions.  You faint. Summary  Heat rash is an itchy rash of little red bumps that often occurs during hot, humid weather.  Symptoms of heat rash include small red bumps that are itchy or prickly and very little or no sweating in the affected area.  This condition is diagnosed based on your symptoms and medical history, as well as a physical exam.  Moving to a cool, dry place is the best treatment for heat rash.  Do not wear tight clothes. Wear comfortable, loose-fitting clothing. This information is not intended to replace advice given to you by your health care provider. Make sure you discuss any questions you have with your health care provider. Document Released: 09/28/2009 Document Revised: 12/21/2016 Document Reviewed: 12/21/2016 Elsevier Interactive Patient Education  2018 [employer]  

## 2017-04-18 DIAGNOSIS — L089 Local infection of the skin and subcutaneous tissue, unspecified: Secondary | ICD-10-CM | POA: Diagnosis not present

## 2017-06-27 ENCOUNTER — Ambulatory Visit (INDEPENDENT_AMBULATORY_CARE_PROVIDER_SITE_OTHER): Payer: 59 | Admitting: Family Medicine

## 2017-06-27 ENCOUNTER — Encounter: Payer: Self-pay | Admitting: Family Medicine

## 2017-06-27 VITALS — BP 110/74 | HR 79 | Temp 97.7°F | Resp 16 | Wt 176.6 lb

## 2017-06-27 DIAGNOSIS — I10 Essential (primary) hypertension: Secondary | ICD-10-CM | POA: Diagnosis not present

## 2017-06-27 DIAGNOSIS — E119 Type 2 diabetes mellitus without complications: Secondary | ICD-10-CM

## 2017-06-27 DIAGNOSIS — Z23 Encounter for immunization: Secondary | ICD-10-CM | POA: Diagnosis not present

## 2017-06-27 DIAGNOSIS — E785 Hyperlipidemia, unspecified: Secondary | ICD-10-CM

## 2017-06-27 LAB — GLUCOSE, POCT (MANUAL RESULT ENTRY): POC GLUCOSE: 124 mg/dL — AB (ref 70–99)

## 2017-06-27 LAB — POCT GLYCOSYLATED HEMOGLOBIN (HGB A1C): Hemoglobin A1C: 6.8

## 2017-06-27 MED ORDER — SITAGLIPTIN PHOSPHATE 100 MG PO TABS: 100.0000 mg | ORAL_TABLET | Freq: Every day | ORAL | 1 refills | Status: DC

## 2017-06-27 MED ORDER — QUINAPRIL HCL 20 MG PO TABS: 20.0000 mg | ORAL_TABLET | Freq: Every day | ORAL | 1 refills | Status: DC

## 2017-06-27 MED ORDER — ATORVASTATIN CALCIUM 40 MG PO TABS: 40.0000 mg | ORAL_TABLET | Freq: Every day | ORAL | 1 refills | Status: DC

## 2017-06-27 MED ORDER — METFORMIN HCL 1000 MG PO TABS: 1000.0000 mg | ORAL_TABLET | Freq: Two times a day (BID) | ORAL | 1 refills | Status: DC

## 2017-06-27 MED ORDER — ATENOLOL 50 MG PO TABS: 50.0000 mg | ORAL_TABLET | Freq: Every day | ORAL | 1 refills | Status: DC

## 2017-06-27 NOTE — Progress Notes (Signed)
Name: Maurice Fischer   MRN: [id number]    DOB: 1951-04-19   Date:06/27/2017       Progress Note  Subjective  Chief Complaint  Chief Complaint  Patient presents with  . Medication Refill    PT will like for all them before his next appt he will run out   . Diabetes    F/U    Diabetes  He presents for his follow-up diabetic visit. He has type 2 diabetes mellitus. His disease course has been improving. Pertinent negatives for hypoglycemia include no headaches. Pertinent negatives for diabetes include no blurred vision, no chest pain, no fatigue, no foot paresthesias, no polydipsia and no polyuria. Pertinent negatives for diabetic complications include no CVA. He is following a diabetic diet. He participates in exercise daily. He monitors blood glucose at home 1-2 x per day. His breakfast blood glucose range is generally 130-140 mg/dl. An ACE inhibitor/angiotensin II receptor blocker is being taken.  Hypertension  This is a chronic problem. The problem is unchanged. The problem is controlled. Pertinent negatives include no blurred vision, chest pain, headaches, palpitations or shortness of breath. Past treatments include ACE inhibitors and beta blockers. Hypertensive end-organ damage includes CAD/MI. There is no history of kidney disease or CVA.  Hyperlipidemia  This is a chronic problem. The problem is controlled. Recent lipid tests were reviewed and are normal. Pertinent negatives include no chest pain or shortness of breath. Current antihyperlipidemic treatment includes statins.    Past Medical History:  Diagnosis Date  . Diabetes mellitus without complication ([place])   . Diverticulosis 2004  . Heart disease   . Hyperlipidemia   . Hypertension   . Inguinal hernia 2015   right  . Umbilical hernia [id number]    Past Surgical History:  Procedure Laterality Date  . blister cut open on right thumb   03/2017  . COLONOSCOPY  2004  . [place]?  [name] HERNIA REPAIR  08/65/7846   umbilical hernia/ Dr Jamal Collin  . HERNIA REPAIR  01/13/2014   right inguinal hernia repair/ Dr Jamal Collin  . TONSILLECTOMY  1957?    Family History  Problem Relation Age of Onset  . Cancer Father        prostate  . Coronary artery disease Mother   . Coronary artery disease Brother     Social History   Social History  . Marital status: Married    Spouse name: N/A  . Number of children: N/A  . Years of education: N/A   Occupational History  . Not on file.   Social History Main Topics  . Smoking status: Former Smoker    Years: 25.00    Types: Cigarettes    Quit date: 10/08/1992  . Smokeless tobacco: Never Used  . Alcohol use 4.2 oz/week    7 Standard drinks or equivalent per week  . Drug use: No  . Sexual activity: Not Currently   Other Topics Concern  . Not on file   Social History Narrative  . No narrative on file     Current Outpatient Prescriptions:  .  aspirin 81 MG tablet, Take 81 mg by mouth daily., Disp: , Rfl:  .  atenolol (TENORMIN) 50 MG tablet, Take 1 tablet (50 mg total) by mouth daily., Disp: 90 tablet, Rfl: 1 .  atorvastatin (LIPITOR) 40 MG tablet, Take 1 tablet (40 mg total) by mouth daily., Disp: 90 tablet, Rfl: 1 .  cholecalciferol (VITAMIN D) 1000 units tablet, Take 1 tablet by mouth  daily., Disp: , Rfl:  .  glucose blood test strip, Use as instructed, Disp: 100 each, Rfl: 1 .  metFORMIN (GLUCOPHAGE) 1000 MG tablet, Take 1 tablet (1,000 mg total) by mouth 2 (two) times daily with a meal., Disp: 180 tablet, Rfl: 1 .  Multiple Vitamin (MULTIVITAMIN) tablet, Take 1 tablet by mouth daily., Disp: , Rfl:  .  quinapril (ACCUPRIL) 20 MG tablet, Take 1 tablet (20 mg total) by mouth daily., Disp: 90 tablet, Rfl: 1 .  sitaGLIPtin (JANUVIA) 100 MG tablet, Take 1 tablet (100 mg total) by mouth daily., Disp: 90 tablet, Rfl: 1 .  triamcinolone cream (KENALOG) 0.1 %, Apply 1 application topically 2 (two) times daily. (Patient not taking: Reported on 06/27/2017), Disp: 30  g, Rfl: 1  No Known Allergies   Review of Systems  Constitutional: Negative for fatigue.  Eyes: Negative for blurred vision.  Respiratory: Negative for shortness of breath.   Cardiovascular: Negative for chest pain and palpitations.  Neurological: Negative for headaches.  Endo/Heme/Allergies: Negative for polydipsia.    Objective  Vitals:   06/27/17 1021  BP: 110/74  Pulse: 79  Resp: 16  Temp: 97.7 F (36.5 C)  TempSrc: Oral  SpO2: 96%  Weight: 176 lb 9.6 oz (80.1 kg)    Physical Exam  Constitutional: He is oriented to person, place, and time and well-developed, well-nourished, and in no distress.  HENT:  Head: Normocephalic and atraumatic.  Cardiovascular: Normal rate, regular rhythm and normal heart sounds.   No murmur heard. Pulmonary/Chest: Effort normal and breath sounds normal. He has no wheezes.  Abdominal: Soft. Bowel sounds are normal. There is no tenderness.  Musculoskeletal: He exhibits no edema.  Neurological: He is alert and oriented to person, place, and time.  Psychiatric: Mood, memory, affect and judgment normal.  Nursing note and vitals reviewed.    Recent Results (from the past 2160 hour(s))  POCT HgB A1C     Status: Abnormal   Collection Time: 06/27/17 10:30 AM  Result Value Ref Range   Hemoglobin A1C 6.8   POCT Glucose (CBG)     Status: Abnormal   Collection Time: 06/27/17 10:32 AM  Result Value Ref Range   POC Glucose 124 (A) 70 - 99 mg/dl     Assessment & Plan  1. Needs flu shot  - Flu vaccine HIGH DOSE PF (Fluzone High dose)  2. Controlled type 2 diabetes mellitus without complication, without long-term current use of insulin (HCC)   A1c 6.8%, well-controlled diabetes - POCT HgB A1C - POCT Glucose (CBG) - metFORMIN (GLUCOPHAGE) 1000 MG tablet; Take 1 tablet (1,000 mg total) by mouth 2 (two) times daily with a meal.  Dispense: 180 tablet; Refill: 1 - sitaGLIPtin (JANUVIA) 100 MG tablet; Take 1 tablet (100 mg total) by mouth  daily.  Dispense: 90 tablet; Refill: 1 - Urine Microalbumin w/creat. ratio  3. Essential hypertension  - atenolol (TENORMIN) 50 MG tablet; Take 1 tablet (50 mg total) by mouth daily.  Dispense: 90 tablet; Refill: 1 - quinapril (ACCUPRIL) 20 MG tablet; Take 1 tablet (20 mg total) by mouth daily.  Dispense: 90 tablet; Refill: 1  4. Hyperlipidemia LDL goal <70  - atorvastatin (LIPITOR) 40 MG tablet; Take 1 tablet (40 mg total) by mouth daily.  Dispense: 90 tablet; Refill: 1  Aaliyah Gavel Asad A. [facility] 06/27/2017 10:33 AM

## 2017-06-28 LAB — MICROALBUMIN / CREATININE URINE RATIO
CREATININE, URINE: 130 mg/dL (ref 20–370)
MICROALB UR: 1 mg/dL
MICROALB/CREAT RATIO: 8 ug/mg{creat} (ref <30)

## 2017-07-28 IMAGING — CR DG SHOULDER 2+V*R*
1 series · 3 of 3 positions shown · non-contrast
Comparison: None in PACs

CLINICAL DATA: Bilateral shoulder pain radiating into the distal
humeri, no numbness or tingling, duration of symptoms 3-4 weeks, no
known injury.

EXAM:
LEFT SHOULDER - 2+ VIEW; RIGHT SHOULDER - 2+ VIEW

[Series 1: dg shoulder right · 0.14mm/px · 3 of 3 slices shown]
[im 1/3]
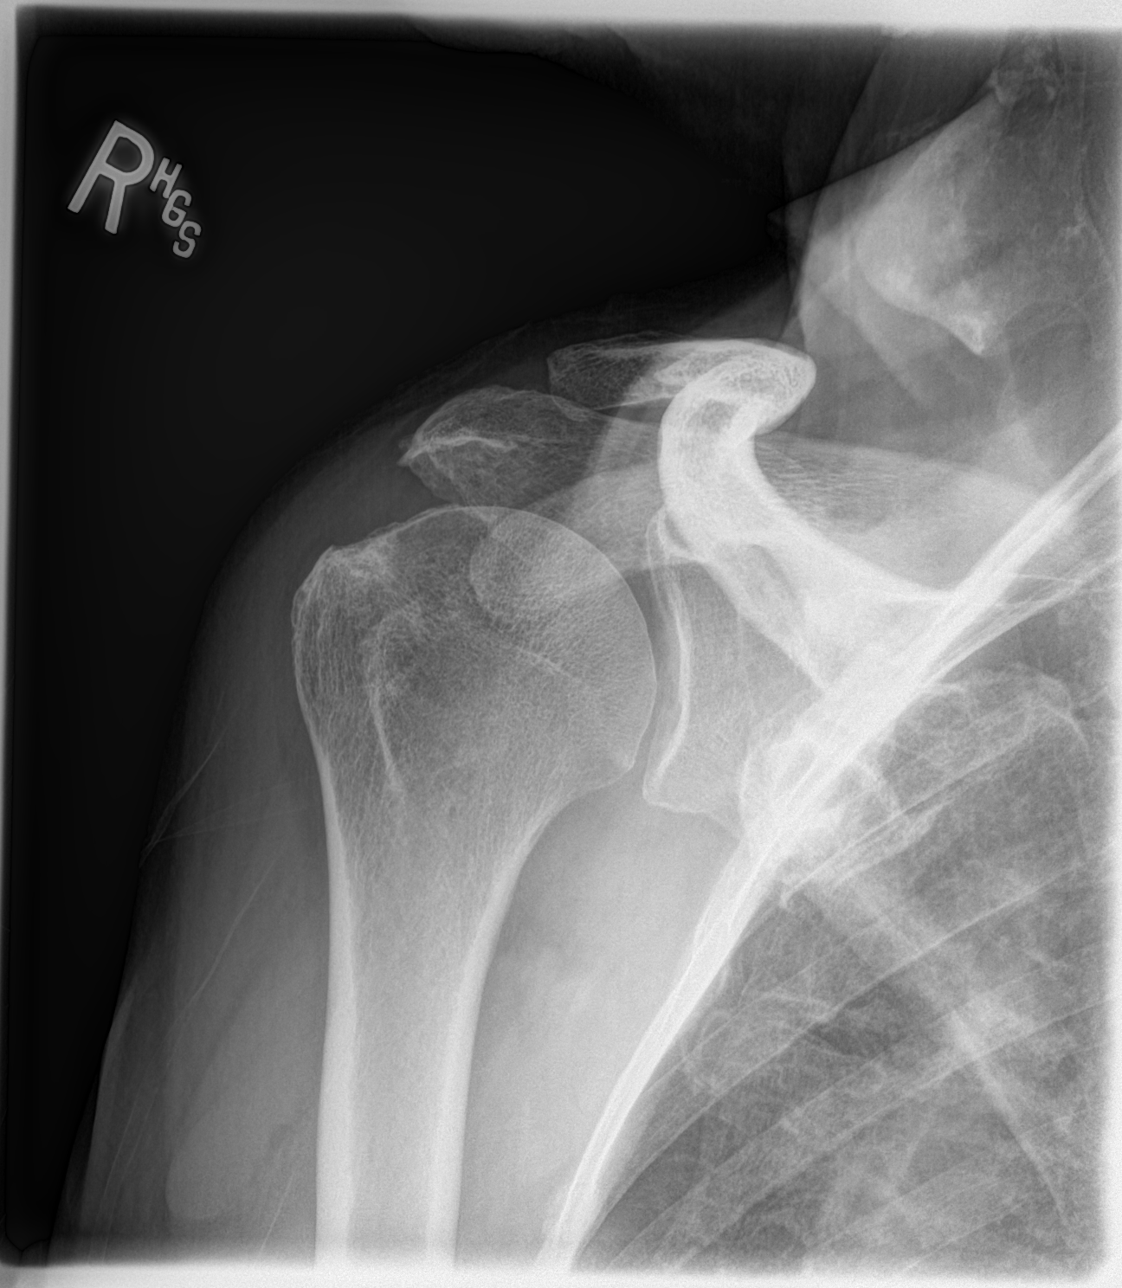
[im 2/3]
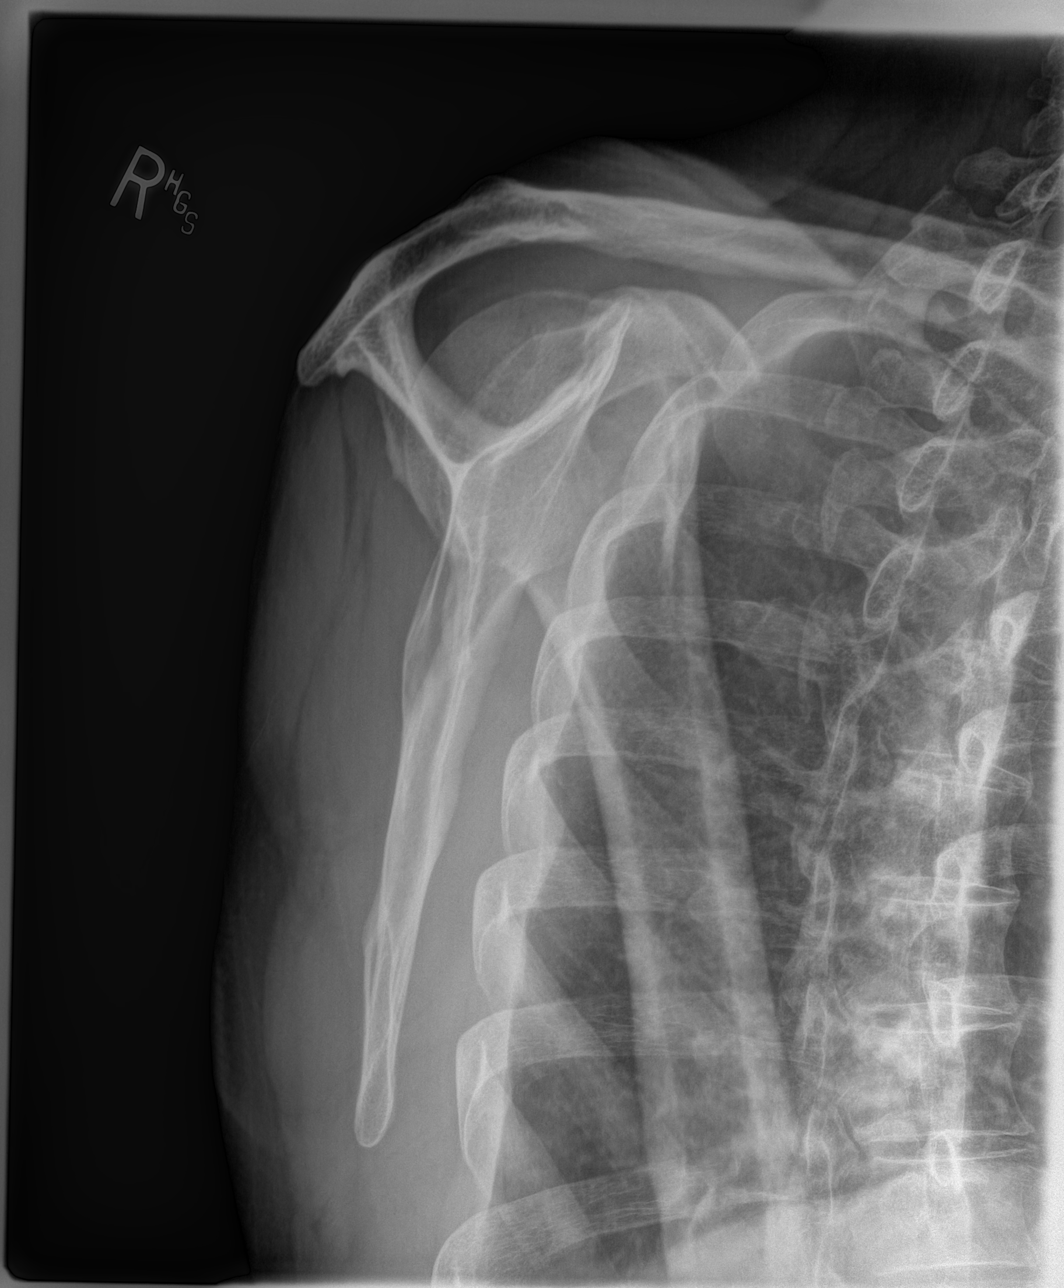
[im 3/3]
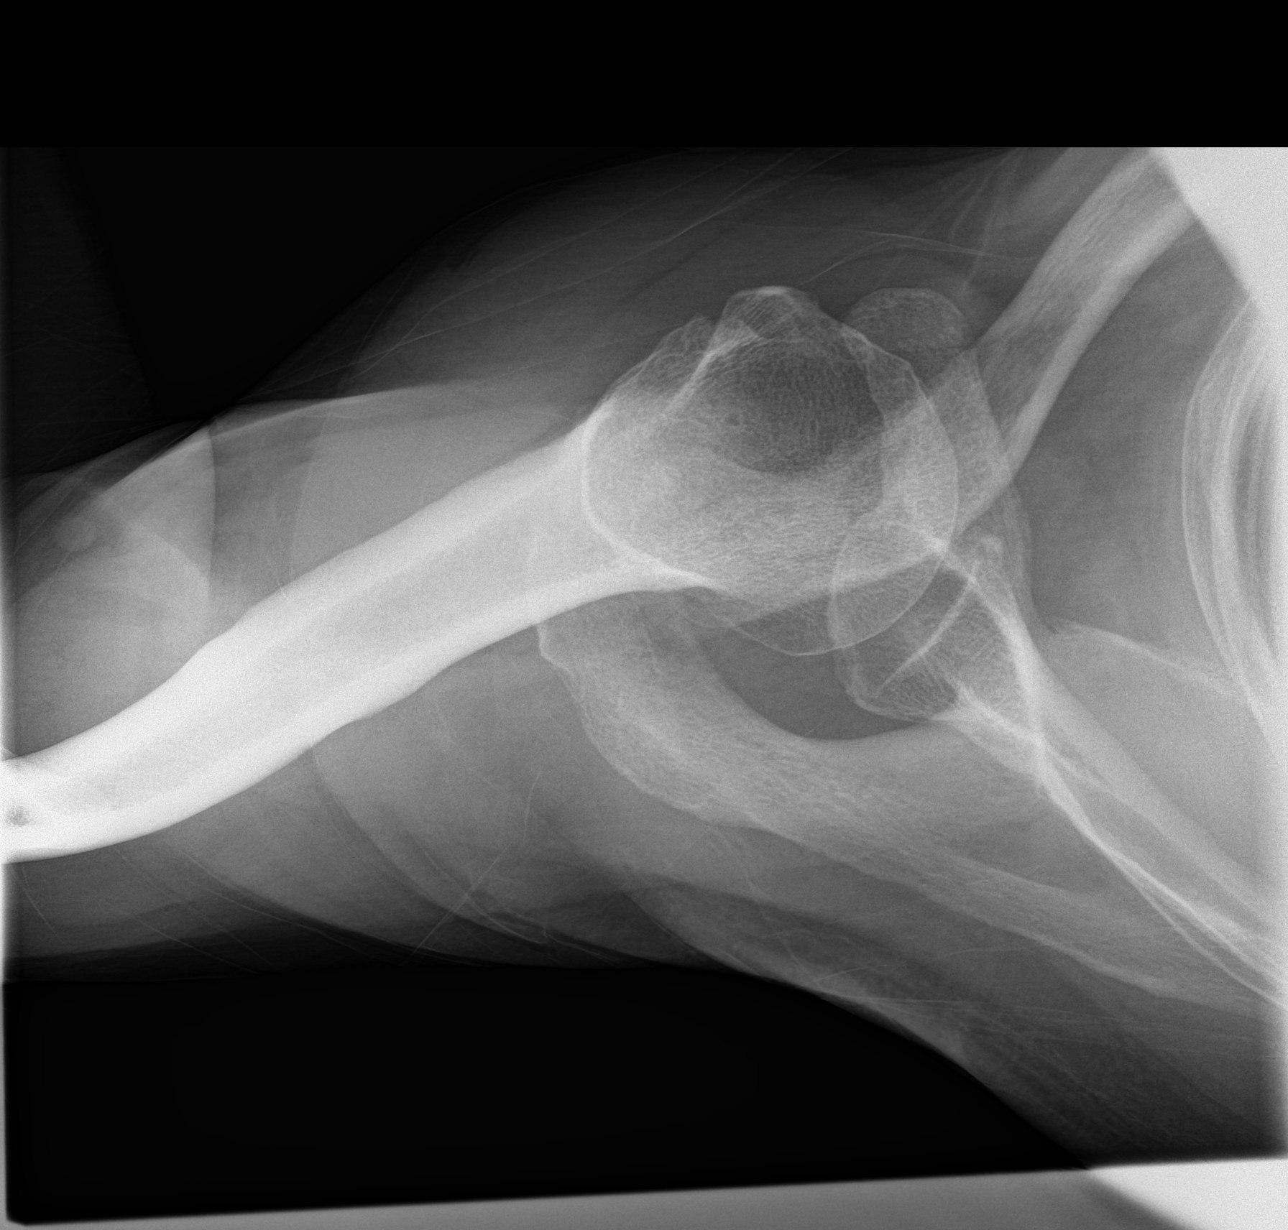

[3 of 3 positions shown; findings below may reference images not displayed]

FINDINGS: Right shoulder: The bones are adequately mineralized. The
glenohumeral and AC joints are unremarkable. There is mild spurring
of the greater tuberosity. There is irregularity of the undersurface
of the acromion. There is no acute fracture nor dislocation. The
observed portions of the upper right ribs are normal.

Left shoulder: The bones of the left shoulder are adequately
mineralized. There is spurring of the greater tuberosity of the
humerus. There is small subacromial spur. The AC joint appears
intact.
IMPRESSION: There is mild degenerative spurring in the region of the greater
tuberosity of the humeri bilaterally. There is a small subacromial
spur on the left. The joint spaces are preserved. No acute fracture
or dislocation is observed. Further evaluation with MRI would be
useful if rotator cuff or glenoid labral pathology is suspected.

## 2017-08-28 DIAGNOSIS — D2261 Melanocytic nevi of right upper limb, including shoulder: Secondary | ICD-10-CM | POA: Diagnosis not present

## 2017-08-28 DIAGNOSIS — D225 Melanocytic nevi of trunk: Secondary | ICD-10-CM | POA: Diagnosis not present

## 2017-08-28 DIAGNOSIS — D2271 Melanocytic nevi of right lower limb, including hip: Secondary | ICD-10-CM | POA: Diagnosis not present

## 2017-08-28 DIAGNOSIS — L570 Actinic keratosis: Secondary | ICD-10-CM | POA: Diagnosis not present

## 2017-08-28 DIAGNOSIS — X32XXXA Exposure to sunlight, initial encounter: Secondary | ICD-10-CM | POA: Diagnosis not present

## 2017-08-28 DIAGNOSIS — D2272 Melanocytic nevi of left lower limb, including hip: Secondary | ICD-10-CM | POA: Diagnosis not present

## 2017-09-06 ENCOUNTER — Ambulatory Visit (INDEPENDENT_AMBULATORY_CARE_PROVIDER_SITE_OTHER): Payer: 59 | Admitting: Family Medicine

## 2017-09-06 ENCOUNTER — Encounter: Payer: Self-pay | Admitting: Family Medicine

## 2017-09-06 VITALS — BP 136/74 | HR 78 | Temp 98.2°F | Resp 16 | Wt 185.2 lb

## 2017-09-06 DIAGNOSIS — L237 Allergic contact dermatitis due to plants, except food: Secondary | ICD-10-CM | POA: Diagnosis not present

## 2017-09-06 MED ORDER — PREDNISONE 10 MG (21) PO TBPK: ORAL_TABLET | ORAL | 0 refills | Status: DC

## 2017-09-06 NOTE — Progress Notes (Signed)
Name: SHAWNDELL Fischer   MRN: [id number]    DOB: [birth year]   Date:09/06/2017       Progress Note  Subjective  Chief Complaint  Chief Complaint  Patient presents with  . Rash    Pt was cutting tress a week ago; Pt do not think its shingles; shot was given 2-3years ago,No pain, itching, red blisters, spread to both inner arms, neck and lower stomach.     Rash  This is a new problem. The current episode started in the past 7 days. The affected locations include the right arm, left arm, abdomen and neck. The rash is characterized by itchiness, blistering and redness. He was exposed to plant contact (was hauling dead treees off his yard last week and rash appeared soon afterwards, started on right arm, spread to abdomen and then left arm.). Pertinent negatives include no cough, fatigue or fever. Past treatments include topical steroids (Triamcinolone cream.).     Past Medical History:  Diagnosis Date  . Diabetes mellitus without complication ([place])   . Diverticulosis 2004  . Heart disease   . Hyperlipidemia   . Hypertension   . Inguinal hernia 2015   right  . Umbilical hernia 4315    Past Surgical History:  Procedure Laterality Date  . blister cut open on right thumb   03/2017  . COLONOSCOPY  2004  . [place]?  [name] HERNIA REPAIR  40/05/6760   umbilical hernia/ Dr Jamal Collin  . HERNIA REPAIR  01/13/2014   right inguinal hernia repair/ Dr Jamal Collin  . TONSILLECTOMY  1957?    Family History  Problem Relation Age of Onset  . Cancer Father        prostate  . Coronary artery disease Mother   . Coronary artery disease Brother     Social History   Socioeconomic History  . Marital status: Married    Spouse name: Not on file  . Number of children: Not on file  . Years of education: Not on file  . Highest education level: Not on file  Social Needs  . Financial resource strain: Not on file  . Food insecurity - worry: Not on file  . Food insecurity - inability: Not on  file  . Transportation needs - medical: Not on file  . Transportation needs - non-medical: Not on file  Occupational History  . Not on file  Tobacco Use  . Smoking status: Former Smoker    Years: 25.00    Types: Cigarettes    Last attempt to quit: 10/08/1992    Years since quitting: 24.9  . Smokeless tobacco: Never Used  Substance and Sexual Activity  . Alcohol use: Yes    Alcohol/week: 4.2 oz    Types: 7 Standard drinks or equivalent per week  . Drug use: No  . Sexual activity: Not Currently  Other Topics Concern  . Not on file  Social History Narrative  . Not on file     Current Outpatient Medications:  .  aspirin 81 MG tablet, Take 81 mg by mouth daily., Disp: , Rfl:  .  atenolol (TENORMIN) 50 MG tablet, Take 1 tablet (50 mg total) by mouth daily., Disp: 90 tablet, Rfl: 1 .  atorvastatin (LIPITOR) 40 MG tablet, Take 1 tablet (40 mg total) by mouth daily., Disp: 90 tablet, Rfl: 1 .  Cholecalciferol (VITAMIN D3) 2000 units TABS, Take 1 tablet by mouth daily., Disp: , Rfl:  .  glucose blood test strip, Use as instructed, Disp:  100 each, Rfl: 1 .  metFORMIN (GLUCOPHAGE) 1000 MG tablet, Take 1 tablet (1,000 mg total) by mouth 2 (two) times daily with a meal., Disp: 180 tablet, Rfl: 1 .  Multiple Vitamin (MULTIVITAMIN) tablet, Take 1 tablet by mouth daily., Disp: , Rfl:  .  quinapril (ACCUPRIL) 20 MG tablet, Take 1 tablet (20 mg total) by mouth daily., Disp: 90 tablet, Rfl: 1 .  sitaGLIPtin (JANUVIA) 100 MG tablet, Take 1 tablet (100 mg total) by mouth daily., Disp: 90 tablet, Rfl: 1 .  triamcinolone cream (KENALOG) 0.1 %, Apply 1 application topically 2 (two) times daily., Disp: 30 g, Rfl: 1  No Known Allergies   Review of Systems  Constitutional: Negative for fatigue and fever.  Respiratory: Negative for cough.   Skin: Positive for rash.     Objective  Vitals:   09/06/17 1351  BP: 136/74  Pulse: 78  Resp: 16  Temp: 98.2 F (36.8 C)  TempSrc: Oral  SpO2: 94%   Weight: 185 lb 3.2 oz (84 kg)    Physical Exam  Constitutional: He is well-developed, well-nourished, and in no distress.  Skin: Lesion and rash noted. Rash is maculopapular and pustular.  Multiple scattered non blanching pruritic erythematous, pustular lesions on right inner arm, lower abdomen, and left distal arm.  Psychiatric: Mood, memory, affect and judgment normal.  Nursing note and vitals reviewed.     Recent Results (from the past 2160 hour(s))  POCT HgB A1C     Status: Abnormal   Collection Time: 06/27/17 10:30 AM  Result Value Ref Range   Hemoglobin A1C 6.8   POCT Glucose (CBG)     Status: Abnormal   Collection Time: 06/27/17 10:32 AM  Result Value Ref Range   POC Glucose 124 (A) 70 - 99 mg/dl  Urine Microalbumin w/creat. ratio     Status: None   Collection Time: 06/27/17 10:37 AM  Result Value Ref Range   Creatinine, Urine 130 20 - 370 mg/dL   Microalb, Ur 1.0 Not estab mg/dL   Microalb Creat Ratio 8 <30 mcg/mg creat    Comment: The ADA has defined abnormalities in albumin excretion as follows:           Category           Result                            (mcg/mg creatinine)                 Normal:    <30       Microalbuminuria:    30 - 299   Clinical albuminuria:    > or = 300   The ADA recommends that at least two of three specimens collected within a 3 - 6 month period be abnormal before considering a patient to be within a diagnostic category.        Assessment & Plan  1. Allergic dermatitis due to poison ivy Start on 6 day tapering dose of prednisone, encouraged to check blood glucose during treatment. - predniSONE (STERAPRED UNI-PAK 21 TAB) 10 MG (21) TBPK tablet; 60 50 40 30 20 10  then STOP  Dispense: 21 tablet; Refill: 0   Maurice Fischer Asad A. [facility] 09/06/2017 2:03 PM

## 2017-09-07 DIAGNOSIS — E119 Type 2 diabetes mellitus without complications: Secondary | ICD-10-CM | POA: Diagnosis not present

## 2017-09-07 DIAGNOSIS — H40003 Preglaucoma, unspecified, bilateral: Secondary | ICD-10-CM | POA: Diagnosis not present

## 2017-09-07 LAB — HM DIABETES EYE EXAM

## 2017-09-08 NOTE — Progress Notes (Unsigned)
Diabetic eye exam done at [facility].

## 2017-09-26 ENCOUNTER — Encounter: Payer: Self-pay | Admitting: Family Medicine

## 2017-09-26 ENCOUNTER — Ambulatory Visit (INDEPENDENT_AMBULATORY_CARE_PROVIDER_SITE_OTHER): Payer: 59 | Admitting: Family Medicine

## 2017-09-26 VITALS — BP 124/76 | HR 73 | Temp 97.9°F | Resp 16 | Ht 68.0 in | Wt 185.0 lb

## 2017-09-26 DIAGNOSIS — E785 Hyperlipidemia, unspecified: Secondary | ICD-10-CM | POA: Diagnosis not present

## 2017-09-26 DIAGNOSIS — E119 Type 2 diabetes mellitus without complications: Secondary | ICD-10-CM | POA: Diagnosis not present

## 2017-09-26 DIAGNOSIS — I10 Essential (primary) hypertension: Secondary | ICD-10-CM

## 2017-09-26 DIAGNOSIS — R21 Rash and other nonspecific skin eruption: Secondary | ICD-10-CM

## 2017-09-26 LAB — COMPLETE METABOLIC PANEL WITH GFR
AG Ratio: 1.8 (calc) (ref 1.0–2.5)
ALBUMIN MSPROF: 4.5 g/dL (ref 3.6–5.1)
ALT: 36 U/L (ref 9–46)
AST: 21 U/L (ref 10–35)
Alkaline phosphatase (APISO): 51 U/L (ref 40–115)
BUN: 18 mg/dL (ref 7–25)
CALCIUM: 10.1 mg/dL (ref 8.6–10.3)
CO2: 29 mmol/L (ref 20–32)
Chloride: 100 mmol/L (ref 98–110)
Creat: 0.74 mg/dL (ref 0.70–1.25)
GFR, EST AFRICAN AMERICAN: 110 mL/min/{1.73_m2} (ref >=60)
GFR, EST NON AFRICAN AMERICAN: 95 mL/min/{1.73_m2} (ref >=60)
GLUCOSE: 143 mg/dL — AB (ref 65–99)
Globulin: 2.5 g/dL (calc) (ref 1.9–3.7)
Potassium: 4.5 mmol/L (ref 3.5–5.3)
Sodium: 137 mmol/L (ref 135–146)
TOTAL PROTEIN: 7 g/dL (ref 6.1–8.1)
Total Bilirubin: 0.9 mg/dL (ref 0.2–1.2)

## 2017-09-26 LAB — GLUCOSE, POCT (MANUAL RESULT ENTRY): POC GLUCOSE: 124 mg/dL — AB (ref 70–99)

## 2017-09-26 LAB — LIPID PANEL
Cholesterol: 138 mg/dL (ref <200)
HDL: 49 mg/dL (ref >40)
LDL Cholesterol (Calc): 69 mg/dL (calc)
NON-HDL CHOLESTEROL (CALC): 89 mg/dL (ref <130)
Total CHOL/HDL Ratio: 2.8 (calc) (ref <5.0)
Triglycerides: 117 mg/dL (ref <150)

## 2017-09-26 LAB — POCT GLYCOSYLATED HEMOGLOBIN (HGB A1C): HEMOGLOBIN A1C: 7.3

## 2017-09-26 MED ORDER — TRIAMCINOLONE ACETONIDE 0.1 % EX CREA: 1.0000 "application " | TOPICAL_CREAM | Freq: Two times a day (BID) | CUTANEOUS | 1 refills | Status: DC

## 2017-09-26 NOTE — Progress Notes (Signed)
Name: Maurice Fischer   MRN: [id number]    DOB: [birth year]   Date:09/26/2017       Progress Note  Subjective  Chief Complaint  Chief Complaint  Patient presents with  . Diabetes    f/u  . Hyperlipidemia    f/u  . Hypertension    f/u    Diabetes  He presents for his follow-up diabetic visit. He has type 2 diabetes mellitus. His disease course has been stable. There are no hypoglycemic associated symptoms. Pertinent negatives for hypoglycemia include no dizziness, headaches, pallor, speech difficulty or sweats. Pertinent negatives for diabetes include no blurred vision, no chest pain, no fatigue, no foot paresthesias, no polydipsia and no polyuria. There are no hypoglycemic complications. Diabetic complications include heart disease. Pertinent negatives for diabetic complications include no CVA or peripheral neuropathy. Current diabetic treatment includes oral agent (dual therapy). He is following a generally healthy diet. He monitors blood glucose at home 1-2 x per day. His breakfast blood glucose range is generally 180-200 mg/dl. An ACE inhibitor/angiotensin II receptor blocker is being taken. Eye exam is current.  Hyperlipidemia  This is a chronic problem. The problem is controlled. Recent lipid tests were reviewed and are normal. Pertinent negatives include no chest pain, leg pain, myalgias or shortness of breath. Current antihyperlipidemic treatment includes statins. Risk factors for coronary artery disease include dyslipidemia and male sex.  Hypertension  This is a chronic problem. The problem is unchanged. The problem is controlled. Pertinent negatives include no blurred vision, chest pain, headaches, palpitations, shortness of breath or sweats. Past treatments include ACE inhibitors and beta blockers. Hypertensive end-organ damage includes CAD/MI. There is no history of kidney disease or CVA.    Past Medical History:  Diagnosis Date  . Diabetes mellitus without complication ([place])    . Diverticulosis 2004  . Heart disease   . Hyperlipidemia   . Hypertension   . Inguinal hernia 2015   right  . Umbilical hernia [id number]    Past Surgical History:  Procedure Laterality Date  . blister cut open on right thumb   03/2017  . COLONOSCOPY  2004  . [place]?  [name] HERNIA REPAIR  68/08/5725   umbilical hernia/ Dr Jamal Collin  . HERNIA REPAIR  01/13/2014   right inguinal hernia repair/ Dr Jamal Collin  . TONSILLECTOMY  1957?    Family History  Problem Relation Age of Onset  . Cancer Father        prostate  . Coronary artery disease Mother   . Coronary artery disease Brother     Social History   Socioeconomic History  . Marital status: Married    Spouse name: Not on file  . Number of children: Not on file  . Years of education: Not on file  . Highest education level: Not on file  Social Needs  . Financial resource strain: Not on file  . Food insecurity - worry: Not on file  . Food insecurity - inability: Not on file  . Transportation needs - medical: Not on file  . Transportation needs - non-medical: Not on file  Occupational History  . Not on file  Tobacco Use  . Smoking status: Former Smoker    Years: 25.00    Types: Cigarettes    Last attempt to quit: 10/08/1992    Years since quitting: 24.9  . Smokeless tobacco: Never Used  Substance and Sexual Activity  . Alcohol use: Yes    Alcohol/week: 4.2 oz    Types:  7 Standard drinks or equivalent per week  . Drug use: No  . Sexual activity: Not Currently  Other Topics Concern  . Not on file  Social History Narrative  . Not on file     Current Outpatient Medications:  .  aspirin 81 MG tablet, Take 81 mg by mouth daily., Disp: , Rfl:  .  atenolol (TENORMIN) 50 MG tablet, Take 1 tablet (50 mg total) by mouth daily., Disp: 90 tablet, Rfl: 1 .  atorvastatin (LIPITOR) 40 MG tablet, Take 1 tablet (40 mg total) by mouth daily., Disp: 90 tablet, Rfl: 1 .  Cholecalciferol (VITAMIN D3) 2000 units TABS, Take 1  tablet by mouth daily., Disp: , Rfl:  .  co-enzyme Q-10 30 MG capsule, Take 30 mg by mouth daily., Disp: , Rfl:  .  glucose blood test strip, Use as instructed, Disp: 100 each, Rfl: 1 .  metFORMIN (GLUCOPHAGE) 1000 MG tablet, Take 1 tablet (1,000 mg total) by mouth 2 (two) times daily with a meal., Disp: 180 tablet, Rfl: 1 .  Multiple Vitamin (MULTIVITAMIN) tablet, Take 1 tablet by mouth daily., Disp: , Rfl:  .  quinapril (ACCUPRIL) 20 MG tablet, Take 1 tablet (20 mg total) by mouth daily., Disp: 90 tablet, Rfl: 1 .  sitaGLIPtin (JANUVIA) 100 MG tablet, Take 1 tablet (100 mg total) by mouth daily., Disp: 90 tablet, Rfl: 1 .  triamcinolone cream (KENALOG) 0.1 %, Apply 1 application topically 2 (two) times daily., Disp: 30 g, Rfl: 1 .  predniSONE (STERAPRED UNI-PAK 21 TAB) 10 MG (21) TBPK tablet, 60 50 40 30 20 10  then STOP (Patient not taking: Reported on 09/26/2017), Disp: 21 tablet, Rfl: 0  No Known Allergies   Review of Systems  Constitutional: Negative for fatigue.  Eyes: Negative for blurred vision.  Respiratory: Negative for shortness of breath.   Cardiovascular: Negative for chest pain and palpitations.  Musculoskeletal: Negative for myalgias.  Skin: Negative for pallor.  Neurological: Negative for dizziness, speech difficulty and headaches.  Endo/Heme/Allergies: Negative for polydipsia.     Objective  Vitals:   09/26/17 0900  BP: 124/76  Pulse: 73  Resp: 16  Temp: 97.9 F (36.6 C)  TempSrc: Oral  SpO2: 98%  Weight: 185 lb (83.9 kg)  Height: 5\' 8"  (1.727 m)    Physical Exam  Constitutional: He is oriented to person, place, and time and well-developed, well-nourished, and in no distress.  HENT:  Head: Normocephalic and atraumatic.  Cardiovascular: Normal rate, regular rhythm and normal heart sounds.  No murmur heard. Pulmonary/Chest: Effort normal and breath sounds normal. He has no wheezes.  Abdominal: Soft. Bowel sounds are normal. There is no tenderness.   Musculoskeletal: He exhibits no edema.  Neurological: He is alert and oriented to person, place, and time.  Skin: Rash noted. Rash is macular.  Macular erythematous rash over the lower abdomen  Psychiatric: Mood, memory, affect and judgment normal.  Nursing note and vitals reviewed.    Recent Results (from the past 2160 hour(s))  HM DIABETES EYE EXAM     Status: None   Collection Time: 09/07/17 12:00 AM  Result Value Ref Range   HM Diabetic Eye Exam No Retinopathy No Retinopathy  POCT Glucose (CBG)     Status: Abnormal   Collection Time: 09/26/17  9:03 AM  Result Value Ref Range   POC Glucose 124 (A) 70 - 99 mg/dl  POCT HgB A1C     Status: Abnormal   Collection Time: 09/26/17  9:09 AM  Result Value Ref Range   Hemoglobin A1C 7.3      Assessment & Plan  1. Type 2 diabetes mellitus without complication, without long-term current use of insulin (HCC)   A1c 7.3%, well-controlled diabetes - POCT HgB A1C - POCT Glucose (CBG)  2. Essential hypertension Blood Pressure stable on present antihypertensive treatment  3. Hyperlipidemia LDL goal <70  - Lipid panel - COMPLETE METABOLIC PANEL WITH GFR  4. Rash and nonspecific skin eruption  - triamcinolone cream (KENALOG) 0.1 %; Apply 1 application topically 2 (two) times daily.  Dispense: 30 g; Refill: 1  Jarelly Rinck Asad A. [facility] 09/26/2017 9:15 AM

## 2017-12-25 ENCOUNTER — Ambulatory Visit (INDEPENDENT_AMBULATORY_CARE_PROVIDER_SITE_OTHER): Payer: Medicare Other | Admitting: Family Medicine

## 2017-12-25 ENCOUNTER — Encounter: Payer: Self-pay | Admitting: Family Medicine

## 2017-12-25 DIAGNOSIS — I10 Essential (primary) hypertension: Secondary | ICD-10-CM | POA: Diagnosis not present

## 2017-12-25 DIAGNOSIS — E785 Hyperlipidemia, unspecified: Secondary | ICD-10-CM | POA: Diagnosis not present

## 2017-12-25 DIAGNOSIS — I2510 Atherosclerotic heart disease of native coronary artery without angina pectoris: Secondary | ICD-10-CM | POA: Diagnosis not present

## 2017-12-25 DIAGNOSIS — E119 Type 2 diabetes mellitus without complications: Secondary | ICD-10-CM | POA: Diagnosis not present

## 2017-12-25 MED ORDER — ATORVASTATIN CALCIUM 40 MG PO TABS: 40.0000 mg | ORAL_TABLET | Freq: Every day | ORAL | 1 refills | Status: DC

## 2017-12-25 MED ORDER — GLUCOSE BLOOD VI STRP: ORAL_STRIP | 12 refills | Status: DC

## 2017-12-25 MED ORDER — QUINAPRIL HCL 20 MG PO TABS: 20.0000 mg | ORAL_TABLET | Freq: Every day | ORAL | 1 refills | Status: DC

## 2017-12-25 MED ORDER — SITAGLIPTIN PHOSPHATE 100 MG PO TABS: 100.0000 mg | ORAL_TABLET | Freq: Every day | ORAL | 1 refills | Status: DC

## 2017-12-25 MED ORDER — ATENOLOL 50 MG PO TABS: 50.0000 mg | ORAL_TABLET | Freq: Every day | ORAL | 1 refills | Status: DC

## 2017-12-25 MED ORDER — METFORMIN HCL 1000 MG PO TABS: 1000.0000 mg | ORAL_TABLET | Freq: Two times a day (BID) | ORAL | 1 refills | Status: DC

## 2017-12-25 NOTE — Assessment & Plan Note (Signed)
Goal LDL less than 70 and his was 69; okay to check 2x a year; limit saturated fats

## 2017-12-25 NOTE — Assessment & Plan Note (Signed)
Well-controlled; avoid the decongestants; continue meds, refill given; try DASH guidelines

## 2017-12-25 NOTE — Assessment & Plan Note (Signed)
Goal LDL is less than 70; continue statin and beta-blocker

## 2017-12-25 NOTE — Assessment & Plan Note (Addendum)
Check A1c today (results have not been entered into the chart at the time of my note, so I will have to CANCEL the order for the POCT A1c in order to sign off on this note); continue meds; urine normal eye and foot exam UTD; check FSBS 1x a day, strips prescribed

## 2017-12-25 NOTE — Patient Instructions (Addendum)
Try to use PLAIN allergy medicine without the decongestant Avoid: phenylephrine, phenylpropanolamine, and pseudoephredine Try to follow the DASH guidelines (DASH stands for Dietary Approaches to Stop Hypertension). Try to limit the sodium in your diet to no more than 1,500mg  of sodium per day. Certainly try to not exceed 2,000 mg per day at the very most. Do not add salt when cooking or at the table.  Check the sodium amount on labels when shopping, and choose items lower in sodium when given a choice. Avoid or limit foods that already contain a lot of sodium. Eat a diet rich in fruits and vegetables and whole grains, and try to lose weight if overweight or obese  DASH Eating Plan DASH stands for "Dietary Approaches to Stop Hypertension." The DASH eating plan is a healthy eating plan that has been shown to reduce high blood pressure (hypertension). It may also reduce your risk for type 2 diabetes, heart disease, and stroke. The DASH eating plan may also help with weight loss. What are tips for following this plan? General guidelines  Avoid eating more than 2,300 mg (milligrams) of salt (sodium) a day. If you have hypertension, you may need to reduce your sodium intake to 1,500 mg a day.  Limit alcohol intake to no more than 1 drink a day for nonpregnant women and 2 drinks a day for men. One drink equals 12 oz of beer, 5 oz of wine, or 1 oz of hard liquor.  Work with your health care provider to maintain a healthy body weight or to lose weight. Ask what an ideal weight is for you.  Get at least 30 minutes of exercise that causes your heart to beat faster (aerobic exercise) most days of the week. Activities may include walking, swimming, or biking.  Work with your health care provider or diet and nutrition specialist (dietitian) to adjust your eating plan to your individual calorie needs. Reading food labels  Check food labels for the amount of sodium per serving. Choose foods with less than 5  percent of the Daily Value of sodium. Generally, foods with less than 300 mg of sodium per serving fit into this eating plan.  To find whole grains, look for the word "whole" as the first word in the ingredient list. Shopping  Buy products labeled as "low-sodium" or "no salt added."  Buy fresh foods. Avoid canned foods and premade or frozen meals. Cooking  Avoid adding salt when cooking. Use salt-free seasonings or herbs instead of table salt or sea salt. Check with your health care provider or pharmacist before using salt substitutes.  Do not fry foods. Cook foods using healthy methods such as baking, boiling, grilling, and broiling instead.  Cook with heart-healthy oils, such as olive, canola, soybean, or sunflower oil. Meal planning   Eat a balanced diet that includes: ? 5 or more servings of fruits and vegetables each day. At each meal, try to fill half of your plate with fruits and vegetables. ? Up to 6-8 servings of whole grains each day. ? Less than 6 oz of lean meat, poultry, or fish each day. A 3-oz serving of meat is about the same size as a deck of cards. One egg equals 1 oz. ? 2 servings of low-fat dairy each day. ? A serving of nuts, seeds, or beans 5 times each week. ? Heart-healthy fats. Healthy fats called Omega-3 fatty acids are found in foods such as flaxseeds and coldwater fish, like sardines, salmon, and mackerel.  Limit how  much you eat of the following: ? Canned or prepackaged foods. ? Food that is high in trans fat, such as fried foods. ? Food that is high in saturated fat, such as fatty meat. ? Sweets, desserts, sugary drinks, and other foods with added sugar. ? Full-fat dairy products.  Do not salt foods before eating.  Try to eat at least 2 vegetarian meals each week.  Eat more home-cooked food and less restaurant, buffet, and fast food.  When eating at a restaurant, ask that your food be prepared with less salt or no salt, if possible. What foods are  recommended? The items listed may not be a complete list. Talk with your dietitian about what dietary choices are best for you. Grains Whole-grain or whole-wheat bread. Whole-grain or whole-wheat pasta. Brown rice. [place]row. Bulgur. Whole-grain and low-sodium cereals. Pita bread. Low-fat, low-sodium crackers. Whole-wheat flour tortillas. Vegetables Fresh or frozen vegetables (raw, steamed, roasted, or grilled). Low-sodium or reduced-sodium tomato and vegetable juice. Low-sodium or reduced-sodium tomato sauce and tomato paste. Low-sodium or reduced-sodium canned vegetables. Fruits All fresh, dried, or frozen fruit. Canned fruit in natural juice (without added sugar). Meat and other protein foods Skinless chicken or [place]. Ground chicken or [place]. Pork with fat trimmed off. Fish and seafood. Egg whites. Dried beans, peas, or lentils. Unsalted nuts, nut butters, and seeds. Unsalted canned beans. Lean cuts of beef with fat trimmed off. Low-sodium, lean deli meat. Dairy Low-fat (1%) or fat-free (skim) milk. Fat-free, low-fat, or reduced-fat cheeses. Nonfat, low-sodium ricotta or cottage cheese. Low-fat or nonfat yogurt. Low-fat, low-sodium cheese. Fats and oils Soft margarine without trans fats. Vegetable oil. Low-fat, reduced-fat, or light mayonnaise and salad dressings (reduced-sodium). Canola, safflower, olive, soybean, and sunflower oils. Avocado. Seasoning and other foods Herbs. Spices. Seasoning mixes without salt. Unsalted popcorn and pretzels. Fat-free sweets. What foods are not recommended? The items listed may not be a complete list. Talk with your dietitian about what dietary choices are best for you. Grains Baked goods made with fat, such as croissants, muffins, or some breads. Dry pasta or rice meal packs. Vegetables Creamed or fried vegetables. Vegetables in a cheese sauce. Regular canned vegetables (not low-sodium or reduced-sodium). Regular canned tomato sauce and paste (not  low-sodium or reduced-sodium). Regular tomato and vegetable juice (not low-sodium or reduced-sodium). Angie Fava. Olives. Fruits Canned fruit in a light or heavy syrup. Fried fruit. Fruit in cream or butter sauce. Meat and other protein foods Fatty cuts of meat. Ribs. Fried meat. Berniece Salines. Sausage. Bologna and other processed lunch meats. Salami. Fatback. Hotdogs. Bratwurst. Salted nuts and seeds. Canned beans with added salt. Canned or smoked fish. Whole eggs or egg yolks. Chicken or [place] with skin. Dairy Whole or 2% milk, cream, and half-and-half. Whole or full-fat cream cheese. Whole-fat or sweetened yogurt. Full-fat cheese. Nondairy creamers. Whipped toppings. Processed cheese and cheese spreads. Fats and oils Butter. Stick margarine. Lard. Shortening. Ghee. Bacon fat. Tropical oils, such as coconut, palm kernel, or palm oil. Seasoning and other foods Salted popcorn and pretzels. Onion salt, garlic salt, seasoned salt, table salt, and sea salt. Worcestershire sauce. Tartar sauce. Barbecue sauce. Teriyaki sauce. Soy sauce, including reduced-sodium. Steak sauce. Canned and packaged gravies. Fish sauce. Oyster sauce. Cocktail sauce. Horseradish that you find on the shelf. Ketchup. Mustard. Meat flavorings and tenderizers. Bouillon cubes. Hot sauce and Tabasco sauce. Premade or packaged marinades. Premade or packaged taco seasonings. Relishes. Regular salad dressings. Where to find more information:  National Heart, Lung, and Blood Institute: https://wilson-eaton.com/  American Heart Association: www.heart.org Summary  The DASH eating plan is a healthy eating plan that has been shown to reduce high blood pressure (hypertension). It may also reduce your risk for type 2 diabetes, heart disease, and stroke.  With the DASH eating plan, you should limit salt (sodium) intake to 2,300 mg a day. If you have hypertension, you may need to reduce your sodium intake to 1,500 mg a day.  When on the DASH eating plan,  aim to eat more fresh fruits and vegetables, whole grains, lean proteins, low-fat dairy, and heart-healthy fats.  Work with your health care provider or diet and nutrition specialist (dietitian) to adjust your eating plan to your individual calorie needs. This information is not intended to replace advice given to you by your health care provider. Make sure you discuss any questions you have with your health care provider. Document Released: 09/29/2011 Document Revised: 10/03/2016 Document Reviewed: 10/03/2016 Elsevier Interactive Patient Education  [employer].

## 2017-12-25 NOTE — Progress Notes (Signed)
BP 122/68 (BP Location: Right Arm, Patient Position: Sitting, Cuff Size: Normal)   Pulse 77   Temp 98 F (36.7 C) (Oral)   Ht 5\' 8"  (1.727 m)   Wt 180 lb 1.6 oz (81.7 kg)   SpO2 97%   BMI 27.38 kg/m    Subjective:    Patient ID: Maurice Fischer, male    DOB: [birth year], 67 y.o.   MRN: [id number]  HPI: Maurice Fischer is a 67 y.o. male  Chief Complaint  Patient presents with  . Follow-up    HPI  Patient is new to me; previous provider left our practice  Type 2 diabetes; under fair control; this morning 164; highest in Nov was on prednisone; no lows; no problems with feet; DM runs in the family; both sides, brother; last eye exam UTD; testing FSBS 1x a day, rarely 2x a day Lab Results  Component Value Date   HGBA1C 7.3 09/26/2017  urine microalb:Cr in Sept normal  High blood pressure; uses salt, but not as much as before; no decongetants Under excellent control  High cholesterol Lab Results  Component Value Date   CHOL 138 09/26/2017   HDL 49 09/26/2017   LDLCALC 46 03/27/2017   TRIG 117 09/26/2017   CHOLHDL 2.8 09/26/2017  LDL 69 in Dec  Hx of MI 25 years ago; no chest pain; used to see cardiologist; taking aspirin, bleeds easier now when scratched but nothing worrisome; no gum bleeding, no blood in urine or stool  Depression screen [facility] 2/9 12/25/2017 09/26/2017 09/06/2017 06/27/2017 03/27/2017  Decreased Interest 0 0 0 0 0  Down, Depressed, Hopeless 0 0 0 0 0  PHQ - 2 Score 0 0 0 0 0    Relevant past medical, surgical, family and social history reviewed Past Medical History:  Diagnosis Date  . Diabetes mellitus without complication ([place])   . Diverticulosis 2004  . Heart disease   . Hyperlipidemia   . Hypertension   . Inguinal hernia 2015   right  . Umbilical hernia [id number]   Past Surgical History:  Procedure Laterality Date  . blister cut open on right thumb   03/2017  . COLONOSCOPY  2004  . [place]?  [name] HERNIA REPAIR  35/36/1443   umbilical hernia/ Dr Jamal Collin  . HERNIA REPAIR  01/13/2014   right inguinal hernia repair/ Dr Jamal Collin  . TONSILLECTOMY  1957?   Family History  Problem Relation Age of Onset  . Cancer Father        prostate  . Coronary artery disease Mother   . Coronary artery disease Brother    Social History   Tobacco Use  . Smoking status: Former Smoker    Years: 25.00    Types: Cigarettes    Last attempt to quit: 10/08/1992    Years since quitting: 25.2  . Smokeless tobacco: Never Used  Substance Use Topics  . Alcohol use: Yes    Alcohol/week: 4.2 oz    Types: 7 Standard drinks or equivalent per week  . Drug use: No    Interim medical history since last visit reviewed. Allergies and medications reviewed  Review of Systems  Cardiovascular: Negative for chest pain.   Per HPI unless specifically indicated above     Objective:    BP 122/68 (BP Location: Right Arm, Patient Position: Sitting, Cuff Size: Normal)   Pulse 77   Temp 98 F (36.7 C) (Oral)   Ht 5\' 8"  (1.727 m)   Wt 180  lb 1.6 oz (81.7 kg)   SpO2 97%   BMI 27.38 kg/m   Wt Readings from Last 3 Encounters:  12/25/17 180 lb 1.6 oz (81.7 kg)  09/26/17 185 lb (83.9 kg)  09/06/17 185 lb 3.2 oz (84 kg)    Physical Exam  Constitutional: He appears well-developed and well-nourished. No distress.  HENT:  Head: Normocephalic and atraumatic.  Eyes: EOM are normal. No scleral icterus.  Neck: No thyromegaly present.  Cardiovascular: Normal rate and regular rhythm.  Pulmonary/Chest: Effort normal and breath sounds normal.  Abdominal: Soft. Bowel sounds are normal. He exhibits no distension.  Musculoskeletal: He exhibits no edema.  Neurological: Coordination normal.  Skin: Skin is warm and dry. No pallor.  Psychiatric: He has a normal mood and affect. His behavior is normal. Judgment and thought content normal.   Diabetic Foot Form - Detailed   Diabetic Foot Exam - detailed Diabetic Foot exam was performed with the following  findings:  Yes 12/25/2017  9:39 AM  Visual Foot Exam completed.:  Yes  Pulse Foot Exam completed.:  Yes  Right Dorsalis Pedis:  Present Left Dorsalis Pedis:  Present  Sensory Foot Exam Completed.:  Yes Semmes-Weinstein Monofilament Test R Site 1-Great Toe:  Pos L Site 1-Great Toe:  Pos        Results for orders placed or performed in visit on 09/26/17  Lipid panel  Result Value Ref Range   Cholesterol 138 <200 mg/dL   HDL 49 >40 mg/dL   Triglycerides 117 <150 mg/dL   LDL Cholesterol (Calc) 69 mg/dL (calc)   Total CHOL/HDL Ratio 2.8 <5.0 (calc)   Non-HDL Cholesterol (Calc) 89 <130 mg/dL (calc)  COMPLETE METABOLIC PANEL WITH GFR  Result Value Ref Range   Glucose, Bld 143 (H) 65 - 99 mg/dL   BUN 18 7 - 25 mg/dL   Creat 0.74 0.70 - 1.25 mg/dL   GFR, Est Non African American 95 > OR = 60 mL/min/1.68m2   GFR, Est African American 110 > OR = 60 mL/min/1.44m2   BUN/Creatinine Ratio NOT APPLICABLE 6 - 22 (calc)   Sodium 137 135 - 146 mmol/L   Potassium 4.5 3.5 - 5.3 mmol/L   Chloride 100 98 - 110 mmol/L   CO2 29 20 - 32 mmol/L   Calcium 10.1 8.6 - 10.3 mg/dL   Total Protein 7.0 6.1 - 8.1 g/dL   Albumin 4.5 3.6 - 5.1 g/dL   Globulin 2.5 1.9 - 3.7 g/dL (calc)   AG Ratio 1.8 1.0 - 2.5 (calc)   Total Bilirubin 0.9 0.2 - 1.2 mg/dL   Alkaline phosphatase (APISO) 51 40 - 115 U/L   AST 21 10 - 35 U/L   ALT 36 9 - 46 U/L  POCT HgB A1C  Result Value Ref Range   Hemoglobin A1C 7.3   POCT Glucose (CBG)  Result Value Ref Range   POC Glucose 124 (A) 70 - 99 mg/dl      Assessment & Plan:   Problem List Items Addressed This Visit      Cardiovascular and Mediastinum   Hypertension    Well-controlled; avoid the decongestants; continue meds, refill given; try DASH guidelines      Relevant Medications   quinapril (ACCUPRIL) 20 MG tablet   atenolol (TENORMIN) 50 MG tablet   atorvastatin (LIPITOR) 40 MG tablet   Atherosclerosis of coronary artery    Goal LDL is less than 70; continue  statin and beta-blocker      Relevant Medications  quinapril (ACCUPRIL) 20 MG tablet   atenolol (TENORMIN) 50 MG tablet   atorvastatin (LIPITOR) 40 MG tablet     Endocrine   Diabetes mellitus without complication (HCC)    Check A1c today (results have not been entered into the chart at the time of my note, so I will have to CANCEL the order for the POCT A1c in order to sign off on this note); continue meds; urine normal eye and foot exam UTD; check FSBS 1x a day, strips prescribed      Relevant Medications   metFORMIN (GLUCOPHAGE) 1000 MG tablet   quinapril (ACCUPRIL) 20 MG tablet   sitaGLIPtin (JANUVIA) 100 MG tablet   atorvastatin (LIPITOR) 40 MG tablet     Other   Hyperlipidemia LDL goal <70    Goal LDL less than 70 and his was 69; okay to check 2x a year; limit saturated fats      Relevant Medications   quinapril (ACCUPRIL) 20 MG tablet   atenolol (TENORMIN) 50 MG tablet   atorvastatin (LIPITOR) 40 MG tablet    Other Visit Diagnoses    Controlled type 2 diabetes mellitus without complication, without long-term current use of insulin (HCC)       Relevant Medications   metFORMIN (GLUCOPHAGE) 1000 MG tablet   quinapril (ACCUPRIL) 20 MG tablet   sitaGLIPtin (JANUVIA) 100 MG tablet   atorvastatin (LIPITOR) 40 MG tablet      Follow up plan: Return in about 3 months (around 03/27/2018) for twenty minute follow-up with fasting labs.  An after-visit summary was printed and given to the patient at [place].  Please see the patient instructions which may contain other information and recommendations beyond what is mentioned above in the assessment and plan.  Meds ordered this encounter  Medications  . metFORMIN (GLUCOPHAGE) 1000 MG tablet    Sig: Take 1 tablet (1,000 mg total) by mouth 2 (two) times daily with a meal.    Dispense:  180 tablet    Refill:  1  . glucose blood (ONE TOUCH ULTRA TEST) test strip    Sig: Use as instructed    Dispense:  100 each    Refill:  12    . quinapril (ACCUPRIL) 20 MG tablet    Sig: Take 1 tablet (20 mg total) by mouth daily.    Dispense:  90 tablet    Refill:  1  . sitaGLIPtin (JANUVIA) 100 MG tablet    Sig: Take 1 tablet (100 mg total) by mouth daily.    Dispense:  90 tablet    Refill:  1  . atenolol (TENORMIN) 50 MG tablet    Sig: Take 1 tablet (50 mg total) by mouth daily.    Dispense:  90 tablet    Refill:  1  . atorvastatin (LIPITOR) 40 MG tablet    Sig: Take 1 tablet (40 mg total) by mouth daily.    Dispense:  90 tablet    Refill:  1    No orders of the defined types were placed in this encounter.

## 2018-01-01 ENCOUNTER — Telehealth: Payer: Self-pay | Admitting: Family Medicine

## 2018-01-01 LAB — POCT GLYCOSYLATED HEMOGLOBIN (HGB A1C): HEMOGLOBIN A1C: 7.1

## 2018-01-01 NOTE — Telephone Encounter (Signed)
Pt would like to work on diet and exercise

## 2018-01-01 NOTE — Telephone Encounter (Signed)
Please let pt know that his A1c was 7.1, just out of the ideal range (we want it under 7); does he want to buck down on his diet and walking and weight or have me add medicine?

## 2018-01-01 NOTE — Addendum Note (Signed)
Addended by: Docia Furl on: 01/01/2018 08:56 AM   Modules accepted: Orders

## 2018-03-27 ENCOUNTER — Ambulatory Visit (INDEPENDENT_AMBULATORY_CARE_PROVIDER_SITE_OTHER): Payer: Medicare Other | Admitting: Family Medicine

## 2018-03-27 ENCOUNTER — Encounter: Payer: Self-pay | Admitting: Family Medicine

## 2018-03-27 VITALS — BP 122/68 | HR 79 | Temp 98.2°F | Resp 12 | Ht 68.0 in | Wt 178.7 lb

## 2018-03-27 DIAGNOSIS — I10 Essential (primary) hypertension: Secondary | ICD-10-CM | POA: Diagnosis not present

## 2018-03-27 DIAGNOSIS — Z5181 Encounter for therapeutic drug level monitoring: Secondary | ICD-10-CM

## 2018-03-27 DIAGNOSIS — E119 Type 2 diabetes mellitus without complications: Secondary | ICD-10-CM

## 2018-03-27 DIAGNOSIS — E785 Hyperlipidemia, unspecified: Secondary | ICD-10-CM

## 2018-03-27 DIAGNOSIS — E781 Pure hyperglyceridemia: Secondary | ICD-10-CM

## 2018-03-27 NOTE — Assessment & Plan Note (Signed)
Patient suspects A1c will be higher; check A1c next week; foot exam by MD today; eye exam UTD but no copy of report on the chart; I asked CMA to request record

## 2018-03-27 NOTE — Progress Notes (Signed)
BP 122/68   Pulse 79   Temp 98.2 F (36.8 C) (Oral)   Resp 12   Ht 5\' 8"  (1.727 m)   Wt 178 lb 11.2 oz (81.1 kg)   SpO2 94%   BMI 27.17 kg/m    Subjective:    Patient ID: Maurice Fischer, male    DOB: Mar 06, 1951, 67 y.o.   MRN: [id number]  HPI: Maurice Fischer is a 67 y.o. male  Chief Complaint  Patient presents with  . Follow-up    HPI Here for follow-up  He was at the gym for 90 minute earlier today; 30 minutes elliptical, 30 treadmill and 15 on bike HTN; excellent control today; adds salt to food but not as much as before Type 2 DM; checking FSBS most days; usually; twice today; in the last 2 weeks, highest sugar maybe 180 or so, lowest maybe 150; never had lows Some dietary indiscretion Urine microalb:Cr was normal in Sept Eye exam UTD, but no copy of actual report on the chart from November (requesting) Lab Results  Component Value Date   HGBA1C 7.1 01/01/2018   Lipid reviewed; excellent; portions are smaller as a rule; not bologna; occasional sausage and bacon, not every day; goes out for breakfast once or twice a week Taking atorvastatin; muscles are tired, does get cramps in the feet; not doing electrolyte replacement after exercising; drinking more water; eats watermelon some and canteloupe and more fruit  Depression screen [facility] 2/9 03/27/2018 12/25/2017 09/26/2017 09/06/2017 06/27/2017  Decreased Interest 0 0 0 0 0  Down, Depressed, Hopeless 0 0 0 0 0  PHQ - 2 Score 0 0 0 0 0    Relevant past medical, surgical, family and social history reviewed Past Medical History:  Diagnosis Date  . Diabetes mellitus without complication ([place])   . Diverticulosis 2004  . Heart disease   . Hyperlipidemia   . Hypertension   . Inguinal hernia 2015   right  . Umbilical hernia [id number]   Past Surgical History:  Procedure Laterality Date  . blister cut open on right thumb   03/2017  . COLONOSCOPY  2004  . [place]?  [name] HERNIA REPAIR  11/91/4782   umbilical  hernia/ Dr Jamal Collin  . HERNIA REPAIR  01/13/2014   right inguinal hernia repair/ Dr Jamal Collin  . TONSILLECTOMY  1957?   Family History  Problem Relation Age of Onset  . Cancer Father        prostate  . Coronary artery disease Mother   . Coronary artery disease Brother    Social History   Tobacco Use  . Smoking status: Former Smoker    Years: 25.00    Types: Cigarettes    Last attempt to quit: 10/08/1992    Years since quitting: 25.4  . Smokeless tobacco: Never Used  Substance Use Topics  . Alcohol use: Yes    Alcohol/week: 4.2 oz    Types: 7 Standard drinks or equivalent per week  . Drug use: No    Interim medical history since last visit reviewed. Allergies and medications reviewed  Review of Systems Per HPI unless specifically indicated above     Objective:    BP 122/68   Pulse 79   Temp 98.2 F (36.8 C) (Oral)   Resp 12   Ht 5\' 8"  (1.727 m)   Wt 178 lb 11.2 oz (81.1 kg)   SpO2 94%   BMI 27.17 kg/m   Wt Readings from Last 3 Encounters:  03/27/18 178 lb 11.2 oz (81.1 kg)  12/25/17 180 lb 1.6 oz (81.7 kg)  09/26/17 185 lb (83.9 kg)    Physical Exam  Constitutional: He appears well-developed and well-nourished. No distress.  HENT:  Head: Normocephalic and atraumatic.  Eyes: EOM are normal. No scleral icterus.  Neck: No thyromegaly present.  Cardiovascular: Normal rate and regular rhythm.  Pulmonary/Chest: Effort normal and breath sounds normal.  Abdominal: Soft. Bowel sounds are normal. He exhibits no distension.  Musculoskeletal: He exhibits no edema.  Neurological: Coordination normal.  Skin: Skin is warm and dry. No pallor.  Psychiatric: He has a normal mood and affect. His behavior is normal. Judgment and thought content normal.   Diabetic Foot Form - Detailed   Diabetic Foot Exam - detailed Diabetic Foot exam was performed with the following findings:  Yes 03/27/2018  3:41 PM  Visual Foot Exam completed.:  Yes  Pulse Foot Exam completed.:  Yes    Right Dorsalis Pedis:  Present Left Dorsalis Pedis:  Present  Sensory Foot Exam Completed.:  Yes Semmes-Weinstein Monofilament Test R Site 1-Great Toe:  Pos L Site 1-Great Toe:  Pos        Results for orders placed or performed in visit on 12/25/17  POCT HgB A1C  Result Value Ref Range   Hemoglobin A1C 7.1       Assessment & Plan:   Problem List Items Addressed This Visit      Cardiovascular and Mediastinum   Hypertension    Continue current regimen; well-controlled; stable, chronic        Endocrine   Diabetes mellitus without complication ([place]) - Primary    Patient suspects A1c will be higher; check A1c next week; foot exam by MD today; eye exam UTD but no copy of report on the chart; I asked CMA to request record      Relevant Orders   Hemoglobin A1c     Other   Hypertriglyceridemia    Check lipids next week      Relevant Orders   Lipid panel   Hyperlipidemia LDL goal <70    Check lipids next week; continue statin      Relevant Orders   Lipid panel    Other Visit Diagnoses    Medication monitoring encounter       Relevant Orders   COMPLETE METABOLIC PANEL WITH GFR       Follow up plan: Return in about 3 months (around 07/11/2018) for twenty minute follow-up with fasting labs.  An after-visit summary was printed and given to the patient at [place].  Please see the patient instructions which may contain other information and recommendations beyond what is mentioned above in the assessment and plan.  No orders of the defined types were placed in this encounter.   Orders Placed This Encounter  Procedures  . Lipid panel  . COMPLETE METABOLIC PANEL WITH GFR  . Hemoglobin A1c

## 2018-03-27 NOTE — Assessment & Plan Note (Signed)
Check lipids next week; continue statin

## 2018-03-27 NOTE — Patient Instructions (Addendum)
Please do see your eye doctor regularly, and have your eyes examined every year (or more often per his or her recommendation) Check your feet every night and let me know right away of any sores, infections, numbness, etc. Try to limit sweets, white bread, white rice, white potatoes It is okay with me for you to not check your fingerstick blood sugars (per [employer] of Endocrinology Best Practices), unless you are interested and feel it would be helpful for you Try to limit saturated fats in your diet (bologna, hot dogs, barbeque, cheeseburgers, hamburgers, steak, bacon, sausage, cheese, etc.) and get more fresh fruits, vegetables, and whole grains Return for fasting labs on or after June 12th

## 2018-03-27 NOTE — Assessment & Plan Note (Signed)
Continue current regimen; well-controlled; stable, chronic

## 2018-03-27 NOTE — Assessment & Plan Note (Signed)
Check lipids next week

## 2018-04-04 ENCOUNTER — Other Ambulatory Visit: Payer: Self-pay

## 2018-04-04 ENCOUNTER — Other Ambulatory Visit: Payer: Self-pay | Admitting: Family Medicine

## 2018-04-04 DIAGNOSIS — E785 Hyperlipidemia, unspecified: Secondary | ICD-10-CM | POA: Diagnosis not present

## 2018-04-04 DIAGNOSIS — Z5181 Encounter for therapeutic drug level monitoring: Secondary | ICD-10-CM | POA: Diagnosis not present

## 2018-04-04 DIAGNOSIS — E119 Type 2 diabetes mellitus without complications: Secondary | ICD-10-CM

## 2018-04-04 DIAGNOSIS — E781 Pure hyperglyceridemia: Secondary | ICD-10-CM

## 2018-04-05 LAB — COMPLETE METABOLIC PANEL WITH GFR
AG RATIO: 2 (calc) (ref 1.0–2.5)
ALT: 32 U/L (ref 9–46)
AST: 19 U/L (ref 10–35)
Albumin: 4.3 g/dL (ref 3.6–5.1)
Alkaline phosphatase (APISO): 46 U/L (ref 40–115)
BUN: 21 mg/dL (ref 7–25)
CALCIUM: 9.7 mg/dL (ref 8.6–10.3)
CHLORIDE: 100 mmol/L (ref 98–110)
CO2: 30 mmol/L (ref 20–32)
Creat: 0.77 mg/dL (ref 0.70–1.25)
GFR, EST NON AFRICAN AMERICAN: 93 mL/min/{1.73_m2} (ref >=60)
GFR, Est African American: 108 mL/min/{1.73_m2} (ref >=60)
Globulin: 2.1 g/dL (calc) (ref 1.9–3.7)
Glucose, Bld: 160 mg/dL — ABNORMAL HIGH (ref 65–99)
POTASSIUM: 5.2 mmol/L (ref 3.5–5.3)
Sodium: 136 mmol/L (ref 135–146)
Total Bilirubin: 0.6 mg/dL (ref 0.2–1.2)
Total Protein: 6.4 g/dL (ref 6.1–8.1)

## 2018-04-05 LAB — LIPID PANEL
CHOL/HDL RATIO: 3.1 (calc) (ref <5.0)
Cholesterol: 140 mg/dL (ref <200)
HDL: 45 mg/dL (ref >40)
LDL Cholesterol (Calc): 73 mg/dL (calc)
NON-HDL CHOLESTEROL (CALC): 95 mg/dL (ref <130)
Triglycerides: 138 mg/dL (ref <150)

## 2018-04-05 LAB — HEMOGLOBIN A1C
EAG (MMOL/L): 9.2 (calc)
HEMOGLOBIN A1C: 7.4 %{Hb} — AB (ref <5.7)
MEAN PLASMA GLUCOSE: 166 (calc)

## 2018-04-09 ENCOUNTER — Encounter: Payer: Self-pay | Admitting: Family Medicine

## 2018-05-10 ENCOUNTER — Ambulatory Visit (INDEPENDENT_AMBULATORY_CARE_PROVIDER_SITE_OTHER): Payer: Medicare Other

## 2018-05-10 VITALS — BP 110/62 | HR 70 | Temp 97.8°F | Resp 12 | Ht 68.0 in | Wt 178.7 lb

## 2018-05-10 DIAGNOSIS — Z0000 Encounter for general adult medical examination without abnormal findings: Secondary | ICD-10-CM | POA: Diagnosis not present

## 2018-05-10 DIAGNOSIS — Z23 Encounter for immunization: Secondary | ICD-10-CM

## 2018-05-10 NOTE — Patient Instructions (Addendum)
Maurice Fischer , Thank you for taking time to come for your Medicare Wellness Visit. I appreciate your ongoing commitment to your health goals. Please review the following plan we discussed and let me know if I can assist you in the future.   Screening recommendations/referrals: Colorectal Screening: Up to date  Vision and Dental Exams: Recommended annual ophthalmology exams for early detection of glaucoma and other disorders of the eye Recommended annual dental exams for proper oral hygiene  Diabetic Exams: Recommended annual diabetic eye exams for early detection of retinopathy Recommended annual diabetic foot exams for early detection of peripheral neuropathy.  Diabetic Eye Exam: You will receive a call from our office regarding your appointment Diabetic Foot Exam: Up to date  Vaccinations: Influenza vaccine: Up to date Pneumococcal vaccine: Completed series today Tdap vaccine: Up to date Shingles vaccine: Please call your insurance company to determine your out of pocket expense for the Shingrix vaccine. You may also receive this vaccine at your local pharmacy or Health Dept.    Advanced directives: Advance directive discussed with you today. I have provided a copy for you to complete at home and have notarized. Once this is complete please bring a copy in to our office so we can scan it into your chart.  Goals: Recommend to drink at least 6-8 8oz glasses of water per day.  Next appointment: Please schedule your Annual Wellness Visit with your Nurse Health Advisor in one year.  Preventive Care 34 Years and Older, Male Preventive care refers to lifestyle choices and visits with your health care provider that can promote health and wellness. What does preventive care include?  A yearly physical exam. This is also called an annual well check.  Dental exams once or twice a year.  Routine eye exams. Ask your health care provider how often you should have your eyes checked.  Personal  lifestyle choices, including:  Daily care of your teeth and gums.  Regular physical activity.  Eating a healthy diet.  Avoiding tobacco and drug use.  Limiting alcohol use.  Practicing safe sex.  Taking low doses of aspirin every day.  Taking vitamin and mineral supplements as recommended by your health care provider. What happens during an annual well check? The services and screenings done by your health care provider during your annual well check will depend on your age, overall health, lifestyle risk factors, and family history of disease. Counseling  Your health care provider may ask you questions about your:  Alcohol use.  Tobacco use.  Drug use.  Emotional well-being.  Home and relationship well-being.  Sexual activity.  Eating habits.  History of falls.  Memory and ability to understand (cognition).  Work and work Statistician. Screening  You may have the following tests or measurements:  Height, weight, and BMI.  Blood pressure.  Lipid and cholesterol levels. These may be checked every 5 years, or more frequently if you are over 72 years old.  Skin check.  Lung cancer screening. You may have this screening every year starting at age 66 if you have a 30-pack-year history of smoking and currently smoke or have quit within the past 15 years.  Fecal occult blood test (FOBT) of the stool. You may have this test every year starting at age 20.  Flexible sigmoidoscopy or colonoscopy. You may have a sigmoidoscopy every 5 years or a colonoscopy every 10 years starting at age 77.  Prostate cancer screening. Recommendations will vary depending on your family history and other risks.  Hepatitis C blood test.  Hepatitis B blood test.  Sexually transmitted disease (STD) testing.  Diabetes screening. This is done by checking your blood sugar (glucose) after you have not eaten for a while (fasting). You may have this done every 1-3 years.  Abdominal aortic  aneurysm (AAA) screening. You may need this if you are a current or former smoker.  Osteoporosis. You may be screened starting at age 42 if you are at high risk. Talk with your health care provider about your test results, treatment options, and if necessary, the need for more tests. Vaccines  Your health care provider may recommend certain vaccines, such as:  Influenza vaccine. This is recommended every year.  Tetanus, diphtheria, and acellular pertussis (Tdap, Td) vaccine. You may need a Td booster every 10 years.  Zoster vaccine. You may need this after age 39.  Pneumococcal 13-valent conjugate (PCV13) vaccine. One dose is recommended after age 87.  Pneumococcal polysaccharide (PPSV23) vaccine. One dose is recommended after age 52. Talk to your health care provider about which screenings and vaccines you need and how often you need them. This information is not intended to replace advice given to you by your health care provider. Make sure you discuss any questions you have with your health care provider. Document Released: 11/06/2015 Document Revised: 06/29/2016 Document Reviewed: 08/11/2015 Elsevier Interactive Patient Education  2017 [employer] Prevention in the Home Falls can cause injuries. They can happen to people of all ages. There are many things you can do to make your home safe and to help prevent falls. What can I do on the outside of my home?  Regularly fix the edges of walkways and driveways and fix any cracks.  Remove anything that might make you trip as you walk through a door, such as a raised step or threshold.  Trim any bushes or trees on the path to your home.  Use bright outdoor lighting.  Clear any walking paths of anything that might make someone trip, such as rocks or tools.  Regularly check to see if handrails are loose or broken. Make sure that both sides of any steps have handrails.  Any raised decks and porches should have guardrails on  the edges.  Have any leaves, snow, or ice cleared regularly.  Use sand or salt on walking paths during winter.  Clean up any spills in your garage right away. This includes oil or grease spills. What can I do in the bathroom?  Use night lights.  Install grab bars by the toilet and in the tub and shower. Do not use towel bars as grab bars.  Use non-skid mats or decals in the tub or shower.  If you need to sit down in the shower, use a plastic, non-slip stool.  Keep the floor dry. Clean up any water that spills on the floor as soon as it happens.  Remove soap buildup in the tub or shower regularly.  Attach bath mats securely with double-sided non-slip rug tape.  Do not have throw rugs and other things on the floor that can make you trip. What can I do in the bedroom?  Use night lights.  Make sure that you have a light by your bed that is easy to reach.  Do not use any sheets or blankets that are too big for your bed. They should not hang down onto the floor.  Have a firm chair that has side arms. You can use this for support while you  get dressed.  Do not have throw rugs and other things on the floor that can make you trip. What can I do in the kitchen?  Clean up any spills right away.  Avoid walking on wet floors.  Keep items that you use a lot in easy-to-reach places.  If you need to reach something above you, use a strong step stool that has a grab bar.  Keep electrical cords out of the way.  Do not use floor polish or wax that makes floors slippery. If you must use wax, use non-skid floor wax.  Do not have throw rugs and other things on the floor that can make you trip. What can I do with my stairs?  Do not leave any items on the stairs.  Make sure that there are handrails on both sides of the stairs and use them. Fix handrails that are broken or loose. Make sure that handrails are as long as the stairways.  Check any carpeting to make sure that it is firmly  attached to the stairs. Fix any carpet that is loose or worn.  Avoid having throw rugs at the top or bottom of the stairs. If you do have throw rugs, attach them to the floor with carpet tape.  Make sure that you have a light switch at the top of the stairs and the bottom of the stairs. If you do not have them, ask someone to add them for you. What else can I do to help prevent falls?  Wear shoes that:  Do not have high heels.  Have rubber bottoms.  Are comfortable and fit you well.  Are closed at the toe. Do not wear sandals.  If you use a stepladder:  Make sure that it is fully opened. Do not climb a closed stepladder.  Make sure that both sides of the stepladder are locked into place.  Ask someone to hold it for you, if possible.  Clearly mark and make sure that you can see:  Any grab bars or handrails.  First and last steps.  Where the edge of each step is.  Use tools that help you move around (mobility aids) if they are needed. These include:  Canes.  Walkers.  Scooters.  Crutches.  Turn on the lights when you go into a dark area. Replace any light bulbs as soon as they burn out.  Set up your furniture so you have a clear path. Avoid moving your furniture around.  If any of your floors are uneven, fix them.  If there are any pets around you, be aware of where they are.  Review your medicines with your doctor. Some medicines can make you feel dizzy. This can increase your chance of falling. Ask your doctor what other things that you can do to help prevent falls. This information is not intended to replace advice given to you by your health care provider. Make sure you discuss any questions you have with your health care provider. Document Released: 08/06/2009 Document Revised: 03/17/2016 Document Reviewed: 11/14/2014 Elsevier Interactive Patient Education  2017 [employer].

## 2018-05-10 NOTE — Progress Notes (Signed)
Subjective:   Maurice Fischer is a 67 y.o. male who presents for Medicare Annual/Subsequent preventive examination.  Review of Systems:  N/A Cardiac Risk Factors include: diabetes mellitus;male gender;advanced age (>41men, >79 women);hypertension;dyslipidemia;sedentary lifestyle     Objective:    Vitals: BP 110/62 (BP Location: Left Arm, Patient Position: Sitting, Cuff Size: Normal)   Pulse 70   Temp 97.8 F (36.6 C) (Oral)   Resp 12   Ht 5\' 8"  (1.727 m)   Wt 178 lb 11.2 oz (81.1 kg)   SpO2 94%   BMI 27.17 kg/m   Body mass index is 27.17 kg/m.  Advanced Directives 05/10/2018 06/27/2017 04/13/2017 03/28/2017 03/27/2017 02/23/2017 01/12/2017  Does Patient Have a Medical Advance Directive? No No No No No No No  Would patient like information on creating a medical advance directive? Yes (MAU/Ambulatory/Procedural Areas - Information given) - - - - - -    Tobacco Social History   Tobacco Use  Smoking Status Former Smoker  . Packs/day: 2.00  . Years: 20.00  . Pack years: 40.00  . Types: Cigarettes  . Last attempt to quit: 10/08/1992  . Years since quitting: 25.6  Smokeless Tobacco Never Used  Tobacco Comment   smoking cessation materials not required     Counseling given: No Comment: smoking cessation materials not required  Clinical Intake:  Pre-visit preparation completed: Yes  Pain : No/denies pain   BMI - recorded: 27.17 Nutritional Status: BMI 25 -29 Overweight Nutritional Risks: None  Nutrition Risk Assessment: Has the patient had any N/V/D within the last 2 months?  No Does the patient have any non-healing wounds?  No Has the patient had any unintentional weight loss or weight gain?  No  Is the patient diabetic?  Yes If diabetic, was a CBG obtained today?  No Did the patient bring in their glucometer from home?  No Comments: Pt monitors CBG's daily. Denies any financial strains with the device or supplies.  Diabetic Exams: Diabetic Eye Exam: Completed  09/07/17.  Diabetic Foot Exam: Completed 03/27/18.   How often do you need to have someone help you when you read instructions, pamphlets, or other written materials from your doctor or pharmacy?: 1 - Never  Interpreter Needed?: No  Information entered by :: Idell Pickles, LPN  Past Medical History:  Diagnosis Date  . Diabetes mellitus without complication ([place])   . Diverticulosis 2004  . Heart disease   . Hyperlipidemia   . Hypertension   . Inguinal hernia 2015   right  . Umbilical hernia [id number]   Past Surgical History:  Procedure Laterality Date  . blister cut open on right thumb   03/2017  . COLONOSCOPY  2004  . [place]?  [name] HERNIA REPAIR  [id number]   umbilical hernia/ Dr Jamal Collin  . HERNIA REPAIR  01/13/2014   right inguinal hernia repair/ Dr Jamal Collin  . TONSILLECTOMY  1957?   Family History  Problem Relation Age of Onset  . Cancer Father        prostate  . Coronary artery disease Mother   . Healthy Sister    Social History   Socioeconomic History  . Marital status: Married    Spouse name: Declined  . Number of children: 0  . Years of education: some college  . Highest education level: 12th grade  Occupational History  . Occupation: Retired  Scientific laboratory technician  . Financial resource strain: Not hard at all  . Food insecurity:    Worry: Never true  Inability: Never true  . Transportation needs:    Medical: No    Non-medical: No  Tobacco Use  . Smoking status: Former Smoker    Packs/day: 2.00    Years: 20.00    Pack years: 40.00    Types: Cigarettes    Last attempt to quit: 10/08/1992    Years since quitting: 25.6  . Smokeless tobacco: Never Used  . Tobacco comment: smoking cessation materials not required  Substance and Sexual Activity  . Alcohol use: Yes    Alcohol/week: 4.2 oz    Types: 7 Standard drinks or equivalent per week  . Drug use: No  . Sexual activity: Not Currently  Lifestyle  . Physical activity:    Days per week: 3 days     Minutes per session: 60 min  . Stress: Not at all  Relationships  . Social connections:    Talks on phone: Patient refused    Gets together: Patient refused    Attends religious service: Patient refused    Active member of club or organization: Patient refused    Attends meetings of clubs or organizations: Patient refused    Relationship status: Married  Other Topics Concern  . Not on file  Social History Narrative  . Not on file    Outpatient Encounter Medications as of 05/10/2018  Medication Sig  . aspirin 81 MG tablet Take 81 mg by mouth daily.  [name] atenolol (TENORMIN) 50 MG tablet Take 1 tablet (50 mg total) by mouth daily.  [name] atorvastatin (LIPITOR) 40 MG tablet Take 1 tablet (40 mg total) by mouth daily.  . Cholecalciferol (VITAMIN D3) 2000 units TABS Take 1 tablet by mouth daily.  [name] glucose blood (ONE TOUCH ULTRA TEST) test strip Use as instructed  . metFORMIN (GLUCOPHAGE) 1000 MG tablet Take 1 tablet (1,000 mg total) by mouth 2 (two) times daily with a meal.  . Multiple Vitamin (MULTIVITAMIN) tablet Take 1 tablet by mouth daily.  . quinapril (ACCUPRIL) 20 MG tablet Take 1 tablet (20 mg total) by mouth daily.  . sitaGLIPtin (JANUVIA) 100 MG tablet Take 1 tablet (100 mg total) by mouth daily.  [name] co-enzyme Q-10 30 MG capsule Take 30 mg by mouth daily.  [name] triamcinolone cream (KENALOG) 0.1 % Apply 1 application topically 2 (two) times daily.   No facility-administered encounter medications on file as of 05/10/2018.     Activities of Daily Living In your present state of health, do you have any difficulty performing the following activities: 05/10/2018 03/27/2018  Hearing? N N  Comment denies hearing aids -  Vision? N N  Comment wears eyeglasses -  Difficulty concentrating or making decisions? N N  Walking or climbing stairs? Y N  Comment knee pain -  Dressing or bathing? N N  Doing errands, shopping? N N  Preparing Food and eating ? N -  Comment denies dentures -  Using the  Toilet? N -  In the past six months, have you accidently leaked urine? N -  Do you have problems with loss of bowel control? N -  Managing your Medications? N -  Managing your Finances? N -  Housekeeping or managing your Housekeeping? N -  Some recent data might be hidden    Patient Care Team: Lada, Satira Anis, MD as PCP - General (Family Medicine)   Assessment:   This is a routine wellness examination for Duanne.  Exercise Activities and Dietary recommendations Current Exercise Habits: Home exercise routine, Type of exercise: strength  training/weights;treadmill, Time (Minutes): 60, Frequency (Times/Week): 3, Weekly Exercise (Minutes/Week): 180, Intensity: Mild, Exercise limited by: None identified  Goals    . DIET - INCREASE WATER INTAKE     Recommend to drink at least 6-8 8oz glasses of water per day.       Fall Risk Fall Risk  05/10/2018 03/27/2018 12/25/2017 09/26/2017 09/06/2017  Falls in the past year? Yes No No No No  Number falls in past yr: 1 - - - -  Injury with Fall? No - - - -  Risk for fall due to : Impaired vision - - - -  Risk for fall due to: Comment wears eyeglasses - - - -  Follow up Falls evaluation completed;Education provided;Falls prevention discussed - - - -   FALL RISK PREVENTION PERTAINING TO HOME: Is your home free of loose throw rugs in walkways, pet beds, electrical cords, etc? Yes Is there adequate lighting in your home to reduce risk of falls?  Yes Are there stairs in or around your home WITH handrails? Yes  ASSISTIVE DEVICES UTILIZED TO PREVENT FALLS: Use of a cane, walker or w/c? No Grab bars in the bathroom? Yes  Shower chair or a place to sit while bathing? No An elevated toilet seat or a handicapped toilet? Yes  Timed Get Up and Go Performed: Yes. Pt ambulated 10 feet within 7 sec. Gait stead-fast and without the use of an assistive device. No intervention required at this time. Fall risk prevention has been discussed.  Community Resource  Referral:  Pt declined my offer to send [employer] Referral to Care Guide for a shower chair.  Depression Screen PHQ 2/9 Scores 05/10/2018 03/27/2018 12/25/2017 09/26/2017  PHQ - 2 Score 0 0 0 0  PHQ- 9 Score 0 - - -    Cognitive Function     6CIT Screen 05/10/2018  What Year? 0 points  What month? 0 points  What time? 0 points  Count back from 20 0 points  Months in reverse 0 points  Repeat phrase 0 points  Total Score 0    Immunization History  Administered Date(s) Administered  . Influenza Split 08/16/2007  . Influenza, High Dose Seasonal PF 07/22/2015, 07/06/2016, 06/27/2017  . Influenza,inj,Quad PF,6+ Mos 06/26/2014  . Pneumococcal Conjugate-13 10/08/2014  . Pneumococcal Polysaccharide-23 10/08/2014, 05/10/2018  . Td 03/04/2013  . Zoster 06/28/2011    Qualifies for Shingles Vaccine? Yes. Zostavax completed 06/28/11. Due for Shingrix. Education has been provided regarding the importance of this vaccine. Pt has been advised to call insurance company to determine out of pocket expense. Advised may also receive vaccine at local pharmacy or Health Dept. Verbalized acceptance and understanding.  Screening Tests Health Maintenance  Topic Date Due  . INFLUENZA VACCINE  05/24/2018  . OPHTHALMOLOGY EXAM  09/07/2018  . HEMOGLOBIN A1C  10/04/2018  . FOOT EXAM  03/28/2019  . TETANUS/TDAP  03/05/2023  . COLONOSCOPY  01/08/2024  . Hepatitis C Screening  Completed  . PNA vac Low Risk Adult  Completed   Cancer Screenings: Lung: Low Dose CT Chest recommended if Age 84-80 years, 30 pack-year currently smoking OR have quit w/in 15years. Patient does qualify but declined my offer to refer to [place] for further Lung Cancer Screening. Colorectal: Completed 01/07/14. Repeat every 10 years  Additional Screenings: Hepatitis C Screening: Completed 02/23/17     Plan:  I have personally reviewed and addressed the Medicare Annual Wellness questionnaire and have noted the following  in the patient's chart:  A. Medical and social history B. Use of alcohol, tobacco or illicit drugs  C. Current medications and supplements D. Functional ability and status E.  Nutritional status F.  Physical activity G. Advance directives H. List of other physicians I.  Hospitalizations, surgeries, and ER visits in previous 12 months J.  [place] such as hearing and vision if needed, cognitive and depression L. Referrals and appointments  In addition, I have reviewed and discussed with patient certain preventive protocols, quality metrics, and best practice recommendations. A written personalized care plan for preventive services as well as general preventive health recommendations were provided to patient.  See attached scanned questionnaire for additional information.   Signed,  Aleatha Borer, LPN Nurse Health Advisor

## 2018-07-02 ENCOUNTER — Ambulatory Visit: Payer: Medicare Other | Admitting: Family Medicine

## 2018-07-09 ENCOUNTER — Other Ambulatory Visit: Payer: Self-pay

## 2018-07-09 ENCOUNTER — Ambulatory Visit (INDEPENDENT_AMBULATORY_CARE_PROVIDER_SITE_OTHER): Payer: Medicare Other | Admitting: Family Medicine

## 2018-07-09 ENCOUNTER — Encounter: Payer: Self-pay | Admitting: Family Medicine

## 2018-07-09 VITALS — BP 128/78 | HR 78 | Temp 98.3°F | Ht 68.0 in | Wt 181.8 lb

## 2018-07-09 DIAGNOSIS — I2510 Atherosclerotic heart disease of native coronary artery without angina pectoris: Secondary | ICD-10-CM

## 2018-07-09 DIAGNOSIS — Z125 Encounter for screening for malignant neoplasm of prostate: Secondary | ICD-10-CM | POA: Insufficient documentation

## 2018-07-09 DIAGNOSIS — Z23 Encounter for immunization: Secondary | ICD-10-CM

## 2018-07-09 DIAGNOSIS — E119 Type 2 diabetes mellitus without complications: Secondary | ICD-10-CM

## 2018-07-09 DIAGNOSIS — I10 Essential (primary) hypertension: Secondary | ICD-10-CM

## 2018-07-09 DIAGNOSIS — E1165 Type 2 diabetes mellitus with hyperglycemia: Secondary | ICD-10-CM

## 2018-07-09 DIAGNOSIS — E785 Hyperlipidemia, unspecified: Secondary | ICD-10-CM | POA: Diagnosis not present

## 2018-07-09 DIAGNOSIS — R9431 Abnormal electrocardiogram [ECG] [EKG]: Secondary | ICD-10-CM

## 2018-07-09 DIAGNOSIS — E781 Pure hyperglyceridemia: Secondary | ICD-10-CM | POA: Diagnosis not present

## 2018-07-09 MED ORDER — ATORVASTATIN CALCIUM 40 MG PO TABS: 40.0000 mg | ORAL_TABLET | Freq: Every day | ORAL | 1 refills | Status: DC

## 2018-07-09 MED ORDER — SITAGLIPTIN PHOSPHATE 100 MG PO TABS: 100.0000 mg | ORAL_TABLET | Freq: Every day | ORAL | 1 refills | Status: DC

## 2018-07-09 MED ORDER — METFORMIN HCL 1000 MG PO TABS: 1000.0000 mg | ORAL_TABLET | Freq: Two times a day (BID) | ORAL | 1 refills | Status: DC

## 2018-07-09 MED ORDER — GLUCOSE BLOOD VI STRP: ORAL_STRIP | 3 refills | Status: DC

## 2018-07-09 MED ORDER — ATENOLOL 50 MG PO TABS: 50.0000 mg | ORAL_TABLET | Freq: Every day | ORAL | 1 refills | Status: DC

## 2018-07-09 MED ORDER — QUINAPRIL HCL 20 MG PO TABS: 20.0000 mg | ORAL_TABLET | Freq: Every day | ORAL | 1 refills | Status: DC

## 2018-07-09 NOTE — Assessment & Plan Note (Addendum)
Hx of cardiac event in 1993; on aspirin and statin; no chest pain but felt heart beating faster today during exercise; abnormal EKG; patient declines stress testing; does agree to see his cardiologist again; EKG faxed to cardiologist; current pain free; see AVS; advised to NOT exercise before seeing cardiologist and he was very open and said that wasn't going to happen

## 2018-07-09 NOTE — Assessment & Plan Note (Signed)
Check lipids 

## 2018-07-09 NOTE — Progress Notes (Signed)
BP 128/78   Pulse 78   Temp 98.3 F (36.8 C) (Oral)   Ht 5\' 8"  (1.727 m)   Wt 181 lb 12.8 oz (82.5 kg)   SpO2 99%   BMI 27.64 kg/m    Subjective:    Patient ID: Maurice Fischer, male    DOB: 10-07-1951, 67 y.o.   MRN: [id number]  HPI: Maurice Fischer is a 66 y.o. male  Chief Complaint  Patient presents with  . Follow-up    HPI  Patient is here for f/u  HTN; higher than normal today; not checking BP away from doctor recently, did before; never a concern before; adds salt, not as much as before  Type 2 diabetes; working this summer, on vacation, not expecting huge improvement in A1c; has not been exercising regularly either the last 3 months; checking FSBS every morning; over 200 one morning with dietary indiscretion; 150 or less lately, and 120 this morning; eye exam UTD Lab Results  Component Value Date   HGBA1C 7.4 (H) 04/04/2018   High cholesterol; last LDL was just over 70; he opted to leave the medicine alone and get back on track Lab Results  Component Value Date   CHOL 140 04/04/2018   CHOL 138 09/26/2017   CHOL 121 03/27/2017   Lab Results  Component Value Date   HDL 45 04/04/2018   HDL 49 09/26/2017   HDL 47 03/27/2017   Lab Results  Component Value Date   LDLCALC 73 04/04/2018   LDLCALC 69 09/26/2017   LDLCALC 46 03/27/2017   Lab Results  Component Value Date   TRIG 138 04/04/2018   TRIG 117 09/26/2017   TRIG 141 03/27/2017   Lab Results  Component Value Date   CHOLHDL 3.1 04/04/2018   CHOLHDL 2.8 09/26/2017   CHOLHDL 2.6 03/27/2017   No results found for: LDLDIRECT  Used to see urologist; does not want to do DRE; agrees to have PSA  Hx of cardiac event; 1993; felt heart beating faster than usual when exercising today; not heavy or forceful; not seeing cardiologist now; he has not seen a cardiologist since 2015; he denies outright chest pain; just noticed when exercising that it was beating faster; keeps heart rate under 140 when  working out; uses the same machine to reproduce the number; he had not been exercising over the summer and thought it might just be deconditioning  Depression screen [facility] 2/9 07/09/2018 05/10/2018 03/27/2018 12/25/2017 09/26/2017  Decreased Interest 0 0 0 0 0  Down, Depressed, Hopeless 0 0 0 0 0  PHQ - 2 Score 0 0 0 0 0  Altered sleeping 0 0 - - -  Tired, decreased energy 0 0 - - -  Change in appetite 0 0 - - -  Feeling bad or failure about yourself  0 0 - - -  Trouble concentrating 0 0 - - -  Moving slowly or fidgety/restless 0 0 - - -  Suicidal thoughts 0 0 - - -  PHQ-9 Score 0 0 - - -  Difficult doing work/chores Not difficult at all Not difficult at all - - -    Relevant past medical, surgical, family and social history reviewed Past Medical History:  Diagnosis Date  . Diabetes mellitus without complication ([place])   . Diverticulosis 2004  . Heart disease   . Hyperlipidemia   . Hypertension   . Inguinal hernia 2015   right  . Umbilical hernia 9030   Past Surgical History:  Procedure Laterality Date  . blister cut open on right thumb   03/2017  . COLONOSCOPY  2004  . Onset?  [name] HERNIA REPAIR  24/06/7352   umbilical hernia/ Dr Jamal Collin  . HERNIA REPAIR  01/13/2014   right inguinal hernia repair/ Dr Jamal Collin  . TONSILLECTOMY  1957?   Family History  Problem Relation Age of Onset  . Cancer Father        prostate  . Coronary artery disease Mother   . Healthy Sister   . Depression Brother    Social History   Tobacco Use  . Smoking status: Former Smoker    Packs/day: 2.00    Years: 20.00    Pack years: 40.00    Types: Cigarettes    Last attempt to quit: 10/08/1992    Years since quitting: 25.7  . Smokeless tobacco: Never Used  . Tobacco comment: smoking cessation materials not required  Substance Use Topics  . Alcohol use: Yes    Alcohol/week: 7.0 standard drinks    Types: 7 Standard drinks or equivalent per week  . Drug use: No    Interim medical  history since last visit reviewed. Allergies and medications reviewed  Review of Systems Per HPI unless specifically indicated above     Objective:    BP 128/78   Pulse 78   Temp 98.3 F (36.8 C) (Oral)   Ht 5\' 8"  (1.727 m)   Wt 181 lb 12.8 oz (82.5 kg)   SpO2 99%   BMI 27.64 kg/m   Wt Readings from Last 3 Encounters:  07/09/18 181 lb 12.8 oz (82.5 kg)  05/10/18 178 lb 11.2 oz (81.1 kg)  03/27/18 178 lb 11.2 oz (81.1 kg)    Physical Exam  Constitutional: He appears well-developed and well-nourished. No distress.  HENT:  Head: Normocephalic and atraumatic.  Eyes: EOM are normal. No scleral icterus.  Neck: No thyromegaly present.  Cardiovascular: Normal rate and regular rhythm.  Pulmonary/Chest: Effort normal and breath sounds normal.  Abdominal: Soft. Bowel sounds are normal. He exhibits no distension.  Musculoskeletal: He exhibits no edema.  Neurological: Coordination normal.  Skin: Skin is warm and dry. No pallor.  Psychiatric: He has a normal mood and affect. His behavior is normal. Judgment and thought content normal.   Diabetic Foot Form - Detailed   Diabetic Foot Exam - detailed Diabetic Foot exam was performed with the following findings:  Yes 07/09/2018  4:01 PM  Visual Foot Exam completed.:  Yes  Pulse Foot Exam completed.:  Yes  Right Dorsalis Pedis:  Present Left Dorsalis Pedis:  Present  Sensory Foot Exam Completed.:  Yes Semmes-Weinstein Monofilament Test R Site 1-Great Toe:  Pos L Site 1-Great Toe:  Pos        Results for orders placed or performed in visit on 04/04/18  Lipid panel  Result Value Ref Range   Cholesterol 140 <200 mg/dL   HDL 45 >40 mg/dL   Triglycerides 138 <150 mg/dL   LDL Cholesterol (Calc) 73 mg/dL (calc)   Total CHOL/HDL Ratio 3.1 <5.0 (calc)   Non-HDL Cholesterol (Calc) 95 <130 mg/dL (calc)  COMPLETE METABOLIC PANEL WITH GFR  Result Value Ref Range   Glucose, Bld 160 (H) 65 - 99 mg/dL   BUN 21 7 - 25 mg/dL   Creat 0.77  0.70 - 1.25 mg/dL   GFR, Est Non African American 93 > OR = 60 mL/min/1.33m2   GFR, Est African American 108 > OR = 60 mL/min/1.52m2  BUN/Creatinine Ratio NOT APPLICABLE 6 - 22 (calc)   Sodium 136 135 - 146 mmol/L   Potassium 5.2 3.5 - 5.3 mmol/L   Chloride 100 98 - 110 mmol/L   CO2 30 20 - 32 mmol/L   Calcium 9.7 8.6 - 10.3 mg/dL   Total Protein 6.4 6.1 - 8.1 g/dL   Albumin 4.3 3.6 - 5.1 g/dL   Globulin 2.1 1.9 - 3.7 g/dL (calc)   AG Ratio 2.0 1.0 - 2.5 (calc)   Total Bilirubin 0.6 0.2 - 1.2 mg/dL   Alkaline phosphatase (APISO) 46 40 - 115 U/L   AST 19 10 - 35 U/L   ALT 32 9 - 46 U/L  Hemoglobin A1c  Result Value Ref Range   Hgb A1c MFr Bld 7.4 (H) <5.7 % of total Hgb   Mean Plasma Glucose 166 (calc)   eAG (mmol/L) 9.2 (calc)      Assessment & Plan:   Problem List Items Addressed This Visit      Cardiovascular and Mediastinum   Hypertension    Continue the course      Relevant Medications   atenolol (TENORMIN) 50 MG tablet   atorvastatin (LIPITOR) 40 MG tablet   quinapril (ACCUPRIL) 20 MG tablet   Coronary artery disease involving native coronary artery of native heart without angina pectoris   Relevant Medications   atenolol (TENORMIN) 50 MG tablet   atorvastatin (LIPITOR) 40 MG tablet   quinapril (ACCUPRIL) 20 MG tablet   Other Relevant Orders   EKG 12-Lead   CBC with Differential/Platelet   Ambulatory referral to Cardiology   Atherosclerosis of coronary artery    Hx of cardiac event in 1993; on aspirin and statin; no chest pain but felt heart beating faster today during exercise; abnormal EKG; patient declines stress testing; does agree to see his cardiologist again; EKG faxed to cardiologist; current pain free; see AVS; advised to NOT exercise before seeing cardiologist and he was very open and said that wasn't going to happen      Relevant Medications   atenolol (TENORMIN) 50 MG tablet   atorvastatin (LIPITOR) 40 MG tablet   quinapril (ACCUPRIL) 20 MG  tablet   Other Relevant Orders   Ambulatory referral to Cardiology     Endocrine   DM (diabetes mellitus), type 2, uncontrolled ([place]) - Primary    Foot exam by MD; check A1c      Relevant Medications   atorvastatin (LIPITOR) 40 MG tablet   metFORMIN (GLUCOPHAGE) 1000 MG tablet   quinapril (ACCUPRIL) 20 MG tablet   sitaGLIPtin (JANUVIA) 100 MG tablet   Other Relevant Orders   Hemoglobin H8N   Basic metabolic panel     Other   Prostate cancer screening    Check PSA      Relevant Orders   PSA   Hypertriglyceridemia    Check lipids      Relevant Medications   atenolol (TENORMIN) 50 MG tablet   atorvastatin (LIPITOR) 40 MG tablet   quinapril (ACCUPRIL) 20 MG tablet   Hyperlipidemia LDL goal <70    Patient would like to try diet and exercise      Relevant Medications   atenolol (TENORMIN) 50 MG tablet   atorvastatin (LIPITOR) 40 MG tablet   quinapril (ACCUPRIL) 20 MG tablet    Other Visit Diagnoses    Abnormal EKG       patient declined stress testing; did agree to see cardiologist; he declined avoiding exercise until seen by cardiologist; continue aspirin  Relevant Orders   Ambulatory referral to Cardiology   Need for influenza vaccination       Relevant Orders   Flu vaccine HIGH DOSE PF (Fluzone High dose) (Completed)   Controlled type 2 diabetes mellitus without complication, without long-term current use of insulin ([facility])       Relevant Medications   atorvastatin (LIPITOR) 40 MG tablet   metFORMIN (GLUCOPHAGE) 1000 MG tablet   quinapril (ACCUPRIL) 20 MG tablet   sitaGLIPtin (JANUVIA) 100 MG tablet       Follow up plan: Return in about 3 months (around 10/08/2018).  An after-visit summary was printed and given to the patient at [facility].  Please see the patient instructions which may contain other information and recommendations beyond what is mentioned above in the assessment and plan.  Meds ordered this encounter  Medications  . atenolol (TENORMIN) 50  MG tablet    Sig: Take 1 tablet (50 mg total) by mouth daily.    Dispense:  90 tablet    Refill:  1  . atorvastatin (LIPITOR) 40 MG tablet    Sig: Take 1 tablet (40 mg total) by mouth daily.    Dispense:  90 tablet    Refill:  1  . metFORMIN (GLUCOPHAGE) 1000 MG tablet    Sig: Take 1 tablet (1,000 mg total) by mouth 2 (two) times daily with a meal.    Dispense:  180 tablet    Refill:  1  . quinapril (ACCUPRIL) 20 MG tablet    Sig: Take 1 tablet (20 mg total) by mouth daily.    Dispense:  90 tablet    Refill:  1  . sitaGLIPtin (JANUVIA) 100 MG tablet    Sig: Take 1 tablet (100 mg total) by mouth daily.    Dispense:  90 tablet    Refill:  1  . glucose blood (ONE TOUCH ULTRA TEST) test strip    Sig: Use as instructed to check fingerstick blood sugars once a day (if desired); LON 99 months, Dx E11.65    Dispense:  100 each    Refill:  3    Orders Placed This Encounter  Procedures  . Flu vaccine HIGH DOSE PF (Fluzone High dose)  . PSA  . Hemoglobin A1c  . Basic metabolic panel  . CBC with Differential/Platelet  . Ambulatory referral to Cardiology  . EKG 12-Lead

## 2018-07-09 NOTE — Patient Instructions (Addendum)
Try to use PLAIN allergy medicine without the decongestant Avoid: phenylephrine, phenylpropanolamine, and pseudoephredine Continue aspirin  Coronary Artery Disease, Male Coronary artery disease (CAD) is a condition in which the arteries that lead to the heart (coronary arteries) become narrow or blocked. The narrowing or blockage can lead to decreased blood flow to the heart. Prolonged reduced blood flow can cause a heart attack (myocardial infarction or MI). This condition may also be called coronary heart disease. Because CAD is the leading cause of death in men, it is important to understand what causes this condition and how it is treated. What are the causes? CAD is most often caused by atherosclerosis. This is the buildup of fat and cholesterol (plaque) on the inside of the arteries. Over time, the plaque may narrow or block the artery, reducing blood flow to the heart. Plaque can also become weak and break off within a coronary artery and cause a sudden blockage. Other less common causes of CAD include:  An embolism or blood clot in a coronary artery.  A tearing of the artery (spontaneous coronary artery dissection).  An aneurysm.  Inflammation (vasculitis) in the artery wall.  What increases the risk? The following factors may make you more likely to develop this condition:  Age. Men over age 20 are at a greater risk of CAD.  Family history of CAD.  Gender. Men often develop CAD earlier in life than women.  High blood pressure (hypertension).  Diabetes.  High cholesterol levels.  Tobacco use.  Excessive alcohol use.  Lack of exercise.  A diet high in saturated and trans fats, such as fried food and processed meat.  Other possible risk factors include:  High stress levels.  Depression.  Obesity.  Sleep apnea.  What are the signs or symptoms? Many people do not have any symptoms during the early stages of CAD. As the condition progresses, symptoms may  include:  Chest pain (angina). The pain can: ? Feel like a crushing or squeezing, or a tightness, pressure, fullness, or heaviness in the chest. ? Last more than a few minutes or can stop and recur. The pain tends to get worse with exercise or stress and to fade with rest.  Pain in the arms, neck, jaw, or back.  Unexplained heartburn or indigestion.  Shortness of breath.  Nausea or vomiting.  Sudden light-headedness.  Sudden cold sweats.  Fluttering or fast heartbeat (palpitations).  How is this diagnosed? This condition is diagnosed based on:  Your family and medical history.  A physical exam.  Tests, including: ? A test to check the electrical signals in your heart (electrocardiogram). ? Exercise stress test. This looks for signs of blockage when the heart is stressed with exercise, such as running on a treadmill. ? Pharmacologic stress test. This test looks for signs of blockage when the heart is being stressed with a medicine. ? Blood tests. ? Coronary angiogram. This is a procedure to look at the coronary arteries to see if there is any blockage. During this test, a dye is injected into your arteries so they appear on an X-ray. ? A test that uses sound waves to take a picture of your heart (echocardiogram). ? Chest X-ray.  How is this treated? This condition may be treated by:  Healthy lifestyle changes to reduce risk factors.  Medicines such as: ? Antiplatelet medicines and blood-thinning medicines, such as aspirin. These help to prevent blood clots. ? Nitroglycerin. ? Blood pressure medicines. ? Cholesterol-lowering medicine.  Coronary angioplasty and  stenting. During this procedure, a thin, flexible tube is inserted through a blood vessel and into a blocked artery. A balloon or similar device on the end of the tube is inflated to open up the artery. In some cases, a small, mesh tube (stent) is inserted into the artery to keep it open.  Coronary artery bypass  surgery. During this surgery, veins or arteries from other parts of the body are used to create a bypass around the blockage and allow blood to reach your heart.  Follow these instructions at home: Medicines  Take over-the-counter and prescription medicines only as told by your health care provider.  Do not take the following medicines unless your health care provider approves: ? NSAIDs, such as ibuprofen, naproxen, or celecoxib. ? Vitamin supplements that contain vitamin A, vitamin E, or both. Lifestyle  Follow an exercise program approved by your health care provider. Aim for 150 minutes of moderate exercise or 75 minutes of vigorous exercise each week.  Maintain a healthy weight or lose weight as approved by your health care provider.  Rest when you are tired.  Learn to manage stress or try to limit your stress. Ask your health care provider for suggestions if you need help.  Get screened for depression and seek treatment, if needed.  Do not use any products that contain nicotine or tobacco, such as cigarettes and e-cigarettes. If you need help quitting, ask your health care provider.  Do not use illegal drugs. Eating and drinking  Follow a heart-healthy diet. A dietitian can help educate you about healthy food options and changes. In general, eat plenty of fruits and vegetables, lean meats, and whole grains.  Avoid foods high in: ? Sugar. ? Salt (sodium). ? Saturated fat, such as processed or fatty meat. ? Trans fat, such as fried foods.  Use healthy cooking methods such as roasting, grilling, broiling, baking, poaching, steaming, or stir-frying.  If you drink alcohol, and your health care provider approves, limit your alcohol intake to no more than 2 drinks per day. One drink equals 12 ounces of beer, 5 ounces of wine, or 1 ounces of hard liquor. General instructions  Manage any other health conditions, such as hypertension and diabetes. These conditions affect your  heart.  Your health care provider may ask you to monitor your blood pressure. Ideally, your blood pressure should be below 130/80.  Keep all follow-up visits as told by your health care provider. This is important. Get help right away if:  You have pain in your chest, neck, arm, jaw, stomach, or back that: ? Lasts more than a few minutes. ? Is recurring. ? Is not relieved by taking medicine under your tongue (sublingualnitroglycerin).  You have too much (profuse) sweating without cause.  You have unexplained: ? Heartburn or indigestion. ? Shortness of breath or difficulty breathing. ? Fluttering or fast heartbeat (palpitations). ? Nausea or vomiting. ? Fatigue. ? Feelings of nervousness or anxiety. ? Weakness. ? Diarrhea.  You have sudden light-headedness or dizziness.  You faint.  You feel like hurting yourself or think about taking your own life. These symptoms may represent a serious problem that is an emergency. Do not wait to see if the symptoms will go away. Get medical help right away. Call your local emergency services (911 in the [place] Do not drive yourself to the hospital. Summary  Coronary artery disease (CAD) is a process in which the arteries that lead to the heart (coronary arteries) become narrow or blocked. The narrowing  or blockage can lead to a heart attack.  Many people do not have any symptoms during the early stages of CAD. This is called "silent CAD."  CAD can be treated with lifestyle changes, medicines, surgery, or a combination of these treatments. This information is not intended to replace advice given to you by your health care provider. Make sure you discuss any questions you have with your health care provider. Document Released: 05/07/2014 Document Revised: 09/30/2016 Document Reviewed: 09/30/2016 Elsevier Interactive Patient Education  2018 [employer].  Cholesterol Cholesterol is a fat. Your body needs a small amount of cholesterol.  Cholesterol (plaque) may build up in your blood vessels (arteries). That makes you more likely to have a heart attack or stroke. You cannot feel your cholesterol level. Having a blood test is the only way to find out if your level is high. Keep your test results. Work with your doctor to keep your cholesterol at a good level. What do the results mean?  Total cholesterol is how much cholesterol is in your blood.  LDL is bad cholesterol. This is the type that can build up. Try to have low LDL.  HDL is good cholesterol. It cleans your blood vessels and carries LDL away. Try to have high HDL.  Triglycerides are fat that the body can store or burn for energy. What are good levels of cholesterol?  Total cholesterol below 200.  LDL below 100 is good for people who have health risks. LDL below 70 is good for people who have very high risks.  HDL above 40 is good. It is best to have HDL of 60 or higher.  Triglycerides below 150. How can I lower my cholesterol? Diet Follow your diet program as told by your doctor.  Choose fish, white meat chicken, or [place] that is roasted or baked. Try not to eat red meat, fried foods, sausage, or lunch meats.  Eat lots of fresh fruits and vegetables.  Choose whole grains, beans, pasta, potatoes, and cereals.  Choose olive oil, corn oil, or canola oil. Only use small amounts.  Try not to eat butter, mayonnaise, shortening, or palm kernel oils.  Try not to eat foods with trans fats.  Choose low-fat or nonfat dairy foods. ? Drink skim or nonfat milk. ? Eat low-fat or nonfat yogurt and cheeses. ? Try not to drink whole milk or cream. ? Try not to eat ice cream, egg yolks, or full-fat cheeses.  Healthy desserts include angel food cake, ginger snaps, animal crackers, hard candy, popsicles, and low-fat or nonfat frozen yogurt. Try not to eat pastries, cakes, pies, and cookies.  Exercise Follow your exercise program as told by your doctor.  Be more  active. Try gardening, walking, and taking the stairs.  Ask your doctor about ways that you can be more active.  Medicine  Take over-the-counter and prescription medicines only as told by your doctor. This information is not intended to replace advice given to you by your health care provider. Make sure you discuss any questions you have with your health care provider. Document Released: 01/06/2009 Document Revised: 05/11/2016 Document Reviewed: 04/21/2016 Elsevier Interactive Patient Education  [employer].

## 2018-07-09 NOTE — Assessment & Plan Note (Signed)
Foot exam by MD; check A1c 

## 2018-07-09 NOTE — Assessment & Plan Note (Signed)
Continue the course

## 2018-07-09 NOTE — Assessment & Plan Note (Signed)
Patient would like to try diet and exercise

## 2018-07-09 NOTE — Assessment & Plan Note (Signed)
Check PSA. ?

## 2018-07-10 ENCOUNTER — Other Ambulatory Visit: Payer: Self-pay | Admitting: Family Medicine

## 2018-07-10 ENCOUNTER — Telehealth: Payer: Self-pay

## 2018-07-10 ENCOUNTER — Other Ambulatory Visit: Payer: Self-pay

## 2018-07-10 MED ORDER — ERTUGLIFLOZIN L-PYROGLUTAMICAC 5 MG PO TABS: 1.0000 | ORAL_TABLET | Freq: Every day | ORAL | 5 refills | Status: DC

## 2018-07-10 NOTE — Progress Notes (Signed)
Add steglatro

## 2018-07-10 NOTE — Telephone Encounter (Signed)
-----   Message from Arnetha Courser, MD sent at 07/10/2018  9:22 AM EDT ----- Please let pt know that his PSA is stable, 0.7; A1c has gone up so diabetes is not controlled; add Steglatro; continue other two medicines; calcium is a little elevated; please ADD ON phosphorus and vitamin D, dx: hypercalcemia; recheck BMP in one month (please ORDER, dx: hypercalcemia); hydrate; CBC normal

## 2018-07-11 LAB — CBC WITH DIFFERENTIAL/PLATELET
BASOS ABS: 64 {cells}/uL (ref 0–200)
Basophils Relative: 0.7 %
Eosinophils Absolute: 451 cells/uL (ref 15–500)
Eosinophils Relative: 4.9 %
HEMATOCRIT: 45.1 % (ref 38.5–50.0)
HEMOGLOBIN: 15.2 g/dL (ref 13.2–17.1)
LYMPHS ABS: 2880 {cells}/uL (ref 850–3900)
MCH: 29.7 pg (ref 27.0–33.0)
MCHC: 33.7 g/dL (ref 32.0–36.0)
MCV: 88.1 fL (ref 80.0–100.0)
MPV: 9.1 fL (ref 7.5–12.5)
Monocytes Relative: 7.6 %
NEUTROS ABS: 5106 {cells}/uL (ref 1500–7800)
Neutrophils Relative %: 55.5 %
Platelets: 272 10*3/uL (ref 140–400)
RBC: 5.12 10*6/uL (ref 4.20–5.80)
RDW: 12 % (ref 11.0–15.0)
Total Lymphocyte: 31.3 %
WBC mixed population: 699 cells/uL (ref 200–950)
WBC: 9.2 10*3/uL (ref 3.8–10.8)

## 2018-07-11 LAB — PSA: PSA: 0.7 ng/mL (ref <=4.0)

## 2018-07-11 LAB — TEST AUTHORIZATION

## 2018-07-11 LAB — BASIC METABOLIC PANEL
BUN: 21 mg/dL (ref 7–25)
CHLORIDE: 101 mmol/L (ref 98–110)
CO2: 32 mmol/L (ref 20–32)
Calcium: 10.6 mg/dL — ABNORMAL HIGH (ref 8.6–10.3)
Creat: 0.8 mg/dL (ref 0.70–1.25)
Glucose, Bld: 135 mg/dL — ABNORMAL HIGH (ref 65–99)
POTASSIUM: 5.3 mmol/L (ref 3.5–5.3)
Sodium: 140 mmol/L (ref 135–146)

## 2018-07-11 LAB — HEMOGLOBIN A1C
Hgb A1c MFr Bld: 7.6 % of total Hgb — ABNORMAL HIGH (ref <5.7)
MEAN PLASMA GLUCOSE: 171 (calc)
eAG (mmol/L): 9.5 (calc)

## 2018-07-11 LAB — PHOSPHORUS: Phosphorus: 3.5 mg/dL (ref 2.1–4.3)

## 2018-07-11 LAB — VITAMIN D 25 HYDROXY (VIT D DEFICIENCY, FRACTURES): Vit D, 25-Hydroxy: 46 ng/mL (ref 30–100)

## 2018-07-18 DIAGNOSIS — E7800 Pure hypercholesterolemia, unspecified: Secondary | ICD-10-CM | POA: Diagnosis not present

## 2018-07-18 DIAGNOSIS — I10 Essential (primary) hypertension: Secondary | ICD-10-CM | POA: Diagnosis not present

## 2018-07-18 DIAGNOSIS — I25118 Atherosclerotic heart disease of native coronary artery with other forms of angina pectoris: Secondary | ICD-10-CM | POA: Diagnosis not present

## 2018-07-18 DIAGNOSIS — I209 Angina pectoris, unspecified: Secondary | ICD-10-CM | POA: Diagnosis not present

## 2018-07-18 DIAGNOSIS — Z87891 Personal history of nicotine dependence: Secondary | ICD-10-CM | POA: Diagnosis not present

## 2018-08-22 DIAGNOSIS — E119 Type 2 diabetes mellitus without complications: Secondary | ICD-10-CM | POA: Diagnosis not present

## 2018-08-22 LAB — HM DIABETES EYE EXAM

## 2018-10-08 ENCOUNTER — Ambulatory Visit: Payer: Medicare Other | Admitting: Family Medicine

## 2018-10-11 ENCOUNTER — Ambulatory Visit (INDEPENDENT_AMBULATORY_CARE_PROVIDER_SITE_OTHER): Payer: Medicare Other | Admitting: Family Medicine

## 2018-10-11 ENCOUNTER — Encounter: Payer: Self-pay | Admitting: Family Medicine

## 2018-10-11 VITALS — BP 118/64 | HR 69 | Temp 97.5°F | Ht 68.0 in | Wt 174.4 lb

## 2018-10-11 DIAGNOSIS — Z5181 Encounter for therapeutic drug level monitoring: Secondary | ICD-10-CM

## 2018-10-11 DIAGNOSIS — E559 Vitamin D deficiency, unspecified: Secondary | ICD-10-CM | POA: Diagnosis not present

## 2018-10-11 DIAGNOSIS — I2510 Atherosclerotic heart disease of native coronary artery without angina pectoris: Secondary | ICD-10-CM | POA: Diagnosis not present

## 2018-10-11 DIAGNOSIS — E1165 Type 2 diabetes mellitus with hyperglycemia: Secondary | ICD-10-CM | POA: Diagnosis not present

## 2018-10-11 DIAGNOSIS — E781 Pure hyperglyceridemia: Secondary | ICD-10-CM

## 2018-10-11 DIAGNOSIS — E663 Overweight: Secondary | ICD-10-CM

## 2018-10-11 DIAGNOSIS — E785 Hyperlipidemia, unspecified: Secondary | ICD-10-CM

## 2018-10-11 DIAGNOSIS — I10 Essential (primary) hypertension: Secondary | ICD-10-CM

## 2018-10-11 MED ORDER — QUINAPRIL HCL 20 MG PO TABS: 10.0000 mg | ORAL_TABLET | Freq: Every day | ORAL | Status: DC

## 2018-10-11 NOTE — Progress Notes (Signed)
BP 118/64   Pulse 69   Temp (!) 97.5 F (36.4 C) (Oral)   Ht 5\' 8"  (1.727 m)   Wt 174 lb 6.4 oz (79.1 kg)   SpO2 99%   BMI 26.52 kg/m    Subjective:    Patient ID: Maurice Fischer, male    DOB: 05/18/1951, 67 y.o.   MRN: [id number]  HPI: Maurice Fischer is a 67 y.o. male  Chief Complaint  Patient presents with  . Follow-up    HPI Patient is here for f/u  High blood pressure; controlled today on atenolol; higher this morning that it has been away from here he says; saw cardiologist a few months ago; does get light-headed; diastolic got down to 42 range; BP is never high  High cholesterol; on statin; does eat 10-12 eggs a week, not interested in ridding the yolk; not much fatty pig meat at all  Lab Results  Component Value Date   CHOL 140 04/04/2018   HDL 45 04/04/2018   LDLCALC 73 04/04/2018   TRIG 138 04/04/2018   CHOLHDL 3.1 04/04/2018    Type 2 diabetes; on steglatro; thinks the numbers will be better; limiting carbs; eye exam UTD; no problems with feet; no sweet tea; not much soda, but drinks diet caffeine-free soda occasionally, not much; some sugar-free powerade  Lab Results  Component Value Date   HGBA1C 7.6 (H) 07/09/2018   He had high calcium in September and additional labs were ordered; patient did not get those done; just forgot  Going to the gym; elliptical, bike, treadmill; resistance training; varies each week; 2-5 x a [facility]; no [facility] pain; heart rate was elevated the last time; was sent to cardiologist; he turned patient loose; nothing to worry about per cardiologist  Depression screen [facility] 2/9 10/11/2018 07/09/2018 05/10/2018 03/27/2018 12/25/2017  Decreased Interest 0 0 0 0 0  Down, Depressed, Hopeless 0 0 0 0 0  PHQ - 2 Score 0 0 0 0 0  Altered sleeping 0 0 0 - -  Tired, decreased energy 0 0 0 - -  Change in appetite 0 0 0 - -  Feeling bad or failure about yourself  0 0 0 - -  Trouble concentrating 0 0 0 - -  Moving slowly or  fidgety/restless 0 0 0 - -  Suicidal thoughts 0 0 0 - -  PHQ-9 Score 0 0 0 - -  Difficult doing work/chores Not difficult at all Not difficult at all Not difficult at all - -   Fall Risk  10/11/2018 07/09/2018 05/10/2018 03/27/2018 12/25/2017  Falls in the past year? 0 No Yes No No  Number falls in past yr: 0 - 1 - -  Injury with Fall? 0 - No - -  Risk for fall due to : - - Impaired vision - -  Risk for fall due to: Comment - - wears eyeglasses - -  Follow up - - Falls evaluation completed;Education provided;Falls prevention discussed - -    Relevant past medical, surgical, family and social history reviewed Past Medical History:  Diagnosis Date  . Diabetes mellitus without complication ([place])   . Diverticulosis 2004  . Heart disease   . Hyperlipidemia   . Hypertension   . Inguinal hernia 2015   right  . Umbilical hernia 2542   Past Surgical History:  Procedure Laterality Date  . blister cut open on right thumb   03/2017  . COLONOSCOPY  2004  .  [place]?  [name] HERNIA REPAIR  48/54/6270   umbilical hernia/ Dr Jamal Collin  . HERNIA REPAIR  01/13/2014   right inguinal hernia repair/ Dr Jamal Collin  . TONSILLECTOMY  1957?   Family History  Problem Relation Age of Onset  . Cancer Father        prostate  . Coronary artery disease Mother   . Healthy Sister   . Depression Brother    Social History   Tobacco Use  . Smoking status: Former Smoker    Packs/day: 2.00    Years: 20.00    Pack years: 40.00    Types: Cigarettes    Last attempt to quit: 10/08/1992    Years since quitting: 26.0  . Smokeless tobacco: Never Used  . Tobacco comment: smoking cessation materials not required  Substance Use Topics  . Alcohol use: Yes    Alcohol/week: 7.0 standard drinks    Types: 7 Standard drinks or equivalent per week  . Drug use: No     Office Visit from 10/11/2018 in [facility]  AUDIT-C Score  4      Interim medical history since last visit  reviewed. Allergies and medications reviewed  Review of Systems  Constitutional: Negative for unexpected weight change (working hard at the gym).  Cardiovascular: Negative for chest pain.  Neurological: Negative for numbness.   Per HPI unless specifically indicated above     Objective:    BP 118/64   Pulse 69   Temp (!) 97.5 F (36.4 C) (Oral)   Ht 5\' 8"  (1.727 m)   Wt 174 lb 6.4 oz (79.1 kg)   SpO2 99%   BMI 26.52 kg/m   Wt Readings from Last 3 Encounters:  10/11/18 174 lb 6.4 oz (79.1 kg)  07/09/18 181 lb 12.8 oz (82.5 kg)  05/10/18 178 lb 11.2 oz (81.1 kg)    Physical Exam Constitutional:      General: He is not in acute distress.    Appearance: He is well-developed.  HENT:     Head: Normocephalic and atraumatic.  Eyes:     General: No scleral icterus. Neck:     Thyroid: No thyromegaly.  Cardiovascular:     Rate and Rhythm: Normal rate and regular rhythm.  Pulmonary:     Effort: Pulmonary effort is normal.     Breath sounds: Normal breath sounds.  Abdominal:     General: Bowel sounds are normal. There is no distension.     Palpations: Abdomen is soft.  Skin:    General: Skin is warm and dry.     Coloration: Skin is not pale.  Neurological:     Coordination: Coordination normal.  Psychiatric:        Behavior: Behavior normal.        Thought Content: Thought content normal.        Judgment: Judgment normal.     Diabetic Foot Form - Detailed   No data filed      Results for orders placed or performed in visit on 08/27/18  HM DIABETES EYE EXAM  Result Value Ref Range   HM Diabetic Eye Exam No Retinopathy No Retinopathy      Assessment & Plan:   Problem List Items Addressed This Visit      Cardiovascular and Mediastinum   Atherosclerosis of coronary artery   Relevant Medications   quinapril (ACCUPRIL) 20 MG tablet   Hypertension (Chronic)    Controlled today      Relevant Medications  quinapril (ACCUPRIL) 20 MG tablet   Coronary artery  disease involving native coronary artery of native heart without angina pectoris (Chronic)    Already checked out by cardiologist; asymptomatic      Relevant Medications   quinapril (ACCUPRIL) 20 MG tablet     Endocrine   DM (diabetes mellitus), type 2, uncontrolled (HCC) - Primary (Chronic)    Check A1c today      Relevant Medications   quinapril (ACCUPRIL) 20 MG tablet   Other Relevant Orders   Urine Microalbumin w/creat. ratio   Hemoglobin A1c   Lipid panel     Other   Vitamin D deficiency    resolved      Overweight (BMI 25.0-29.9) (Chronic)    I am so proud of patient's efforts at weight loss; expect numbers to have improved      Hypertriglyceridemia (Chronic)   Relevant Medications   quinapril (ACCUPRIL) 20 MG tablet   Other Relevant Orders   Lipid panel   Hyperlipidemia LDL goal <70 (Chronic)    Limit saturated fats; continue statin; goal LDL is less than 70      Relevant Medications   quinapril (ACCUPRIL) 20 MG tablet   Other Relevant Orders   Lipid panel    Other Visit Diagnoses    Medication monitoring encounter       Relevant Orders   COMPLETE METABOLIC PANEL WITH GFR   Hypercalcemia       Relevant Orders   Phosphorus       Follow up plan: Return in about 3 months (around 01/10/2019) for follow-up visit with Dr. Sanda Klein.  An after-visit summary was printed and given to the patient at [place].  Please see the patient instructions which may contain other information and recommendations beyond what is mentioned above in the assessment and plan.  Meds ordered this encounter  Medications  . quinapril (ACCUPRIL) 20 MG tablet    Sig: Take 0.5 tablets (10 mg total) by mouth daily.    Orders Placed This Encounter  Procedures  . Urine Microalbumin w/creat. ratio  . Hemoglobin A1c  . Lipid panel  . COMPLETE METABOLIC PANEL WITH GFR  . Phosphorus

## 2018-10-11 NOTE — Assessment & Plan Note (Signed)
Controlled today 

## 2018-10-11 NOTE — Assessment & Plan Note (Signed)
I am so proud of patient's efforts at weight loss; expect numbers to have improved

## 2018-10-11 NOTE — Assessment & Plan Note (Signed)
Check A1c today.

## 2018-10-11 NOTE — Assessment & Plan Note (Signed)
resolved 

## 2018-10-11 NOTE — Assessment & Plan Note (Signed)
Limit saturated fats; continue statin; goal LDL is less than 70

## 2018-10-11 NOTE — Assessment & Plan Note (Signed)
Already checked out by cardiologist; asymptomatic

## 2018-10-11 NOTE — Patient Instructions (Addendum)
Decrease quinapril to 10 mg daily Monitor BP at home and call if trending up or down; goal systolic is less than 761 on top Try to limit saturated fats in your diet (bologna, hot dogs, barbeque, cheeseburgers, hamburgers, steak, bacon, sausage, cheese, etc.) and get more fresh fruits, vegetables, and whole grains Please do see your eye doctor regularly, and have your eyes examined every year (or more often per his or her recommendation) Check your feet every night and let me know right away of any sores, infections, numbness, etc. Try to limit sweets, white bread, white rice, white potatoes It is okay with me for you to not check your fingerstick blood sugars unless you are interested and feel it would be helpful for you

## 2018-10-12 LAB — COMPLETE METABOLIC PANEL WITH GFR
AG Ratio: 1.8 (calc) (ref 1.0–2.5)
ALKALINE PHOSPHATASE (APISO): 46 U/L (ref 40–115)
ALT: 34 U/L (ref 9–46)
AST: 22 U/L (ref 10–35)
Albumin: 4.4 g/dL (ref 3.6–5.1)
BILIRUBIN TOTAL: 1 mg/dL (ref 0.2–1.2)
BUN: 19 mg/dL (ref 7–25)
CO2: 32 mmol/L (ref 20–32)
CREATININE: 0.89 mg/dL (ref 0.70–1.25)
Calcium: 10.5 mg/dL — ABNORMAL HIGH (ref 8.6–10.3)
Chloride: 100 mmol/L (ref 98–110)
GFR, Est African American: 101 mL/min/{1.73_m2} (ref >=60)
GFR, Est Non African American: 87 mL/min/{1.73_m2} (ref >=60)
GLOBULIN: 2.5 g/dL (ref 1.9–3.7)
GLUCOSE: 130 mg/dL — AB (ref 65–99)
Potassium: 4.7 mmol/L (ref 3.5–5.3)
SODIUM: 140 mmol/L (ref 135–146)
Total Protein: 6.9 g/dL (ref 6.1–8.1)

## 2018-10-12 LAB — HEMOGLOBIN A1C
Hgb A1c MFr Bld: 7 % of total Hgb — ABNORMAL HIGH (ref <5.7)
Mean Plasma Glucose: 154 (calc)
eAG (mmol/L): 8.5 (calc)

## 2018-10-12 LAB — PHOSPHORUS: PHOSPHORUS: 3.7 mg/dL (ref 2.1–4.3)

## 2018-10-12 LAB — LIPID PANEL
CHOL/HDL RATIO: 2.6 (calc) (ref <5.0)
Cholesterol: 134 mg/dL (ref <200)
HDL: 51 mg/dL (ref >40)
LDL CHOLESTEROL (CALC): 64 mg/dL
Non-HDL Cholesterol (Calc): 83 mg/dL (calc) (ref <130)
TRIGLYCERIDES: 107 mg/dL (ref <150)

## 2018-10-12 LAB — MICROALBUMIN / CREATININE URINE RATIO
Creatinine, Urine: 94 mg/dL (ref 20–320)
MICROALB UR: 0.5 mg/dL
MICROALB/CREAT RATIO: 5 ug/mg{creat} (ref <30)

## 2019-01-16 ENCOUNTER — Telehealth (INDEPENDENT_AMBULATORY_CARE_PROVIDER_SITE_OTHER): Payer: Medicare Other | Admitting: Nurse Practitioner

## 2019-01-16 ENCOUNTER — Other Ambulatory Visit: Payer: Self-pay

## 2019-01-16 ENCOUNTER — Encounter: Payer: Self-pay | Admitting: Nurse Practitioner

## 2019-01-16 VITALS — BP 134/72 | HR 79 | Temp 97.9°F

## 2019-01-16 DIAGNOSIS — M150 Primary generalized (osteo)arthritis: Secondary | ICD-10-CM | POA: Diagnosis not present

## 2019-01-16 DIAGNOSIS — E559 Vitamin D deficiency, unspecified: Secondary | ICD-10-CM

## 2019-01-16 DIAGNOSIS — I10 Essential (primary) hypertension: Secondary | ICD-10-CM

## 2019-01-16 DIAGNOSIS — J301 Allergic rhinitis due to pollen: Secondary | ICD-10-CM

## 2019-01-16 DIAGNOSIS — E119 Type 2 diabetes mellitus without complications: Secondary | ICD-10-CM

## 2019-01-16 DIAGNOSIS — E785 Hyperlipidemia, unspecified: Secondary | ICD-10-CM

## 2019-01-16 DIAGNOSIS — M159 Polyosteoarthritis, unspecified: Secondary | ICD-10-CM

## 2019-01-16 DIAGNOSIS — I2510 Atherosclerotic heart disease of native coronary artery without angina pectoris: Secondary | ICD-10-CM

## 2019-01-16 MED ORDER — QUINAPRIL HCL 10 MG PO TABS: 10.0000 mg | ORAL_TABLET | Freq: Every day | ORAL | 1 refills | Status: DC

## 2019-01-16 MED ORDER — METFORMIN HCL 1000 MG PO TABS: 1000.0000 mg | ORAL_TABLET | Freq: Two times a day (BID) | ORAL | 1 refills | Status: DC

## 2019-01-16 MED ORDER — ATENOLOL 50 MG PO TABS: 50.0000 mg | ORAL_TABLET | Freq: Every day | ORAL | 1 refills | Status: DC

## 2019-01-16 MED ORDER — SITAGLIPTIN PHOSPHATE 100 MG PO TABS: 100.0000 mg | ORAL_TABLET | Freq: Every day | ORAL | 1 refills | Status: DC

## 2019-01-16 MED ORDER — ATORVASTATIN CALCIUM 40 MG PO TABS: 40.0000 mg | ORAL_TABLET | Freq: Every day | ORAL | 1 refills | Status: DC

## 2019-01-16 MED ORDER — ERTUGLIFLOZIN L-PYROGLUTAMICAC 5 MG PO TABS: 1.0000 | ORAL_TABLET | Freq: Every day | ORAL | 1 refills | Status: DC

## 2019-01-16 NOTE — H&P (Signed)
Virtual Visit via Telephone Note  I connected with Maurice Fischer on 01/16/19 at  8:40 AM EDT by telephone and verified that I am speaking with the correct person using two identifiers.   Staff discussed the limitations, risks, security and privacy concerns of performing an evaluation and management service by telephone and the availability of in person appointments. I also discussed with the patient that there may be a patient responsible charge related to this service. The patient expressed understanding and agreed to proceed.   History of Present Illness: Hypertension Prescribed atenolol 50mg  daily, quinapril 10mg  daily, states at last visit his blood pressure was getting to low and getting too low; seems to be more helpful Early in the morning they are higher 134/72 Later in the day 115/62- no longer notices lightheadedness maybe rarely. Atherosclerosis BP and lipid control, takes statin and 81mg  aspirin daily  The 10-year ASCVD risk score Maurice Bussing DC Jr., et al., 2013) is: 30.1%   Values used to calculate the score:     Age: 68 years     Sex: Male     Is Non-Hispanic African American: No     Diabetic: Yes     Tobacco smoker: No     Systolic Blood Pressure: 130 mmHg     Is BP treated: Yes     HDL Cholesterol: 51 mg/dL     Total Cholesterol: 134 mg/dL  Diabetes Mellitus Prescribed metformin 1000mg , steglatro 5mg , januvia 100mg  daily  Checks blood sugars once a day- this morning was 145; 129 7 day average; 30 day average 132.  Endorses polyphagia, denies polyuria and polyphagia.   Lab Results  Component Value Date   HGBA1C 7.0 (H) 10/11/2018    Osteoarthritis Endorses pain and stiffness in knees, fingers joints, does not need anything for pain relief  Hyperlipidemia Prescribed atorvastatin 40mg  daily  Lab Results  Component Value Date   CHOL 134 10/11/2018   HDL 51 10/11/2018   LDLCALC 64 10/11/2018   TRIG 107 10/11/2018   CHOLHDL 2.6 10/11/2018    Vitamin D  Deficiency Recommended to take 2,000IU OTC supplementation  Seasonal Allergies Takes OTC antihistamine- zyrtec; endorses rhinorrhea. 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   Observations/Objective: Alert and oriented Able to speak in full sentences without difficulty.  See Vitals  Assessment and Plan:  1. Controlled type 2 diabetes mellitus without complication, without long-term current use of insulin (HCC) Discussed diet - Ertugliflozin L-PyroglutamicAc (STEGLATRO) 5 MG TABS; Take 1 tablet by mouth daily. For diabetes  Dispense: 90 tablet; Refill: 1 - metFORMIN (GLUCOPHAGE) 1000 MG tablet; Take 1 tablet (1,000 mg total) by mouth 2 (two) times daily with a meal.  Dispense: 180 tablet; Refill: 1 - sitaGLIPtin (JANUVIA) 100 MG tablet; Take 1 tablet (100 mg total) by mouth daily.  Dispense: 90 tablet; Refill: 1  2. Essential hypertension Well controlled,  - quinapril (ACCUPRIL) 10 MG tablet; Take 1 tablet (10 mg total) by mouth daily.  Dispense: 90 tablet; Refill: 1 - atenolol (TENORMIN) 50 MG tablet; Take 1 tablet (50 mg total) by mouth daily.  Dispense: 90 tablet; Refill: 1  3. Hyperlipidemia LDL goal <70 Discussed diet  - atorvastatin (LIPITOR) 40 MG tablet; Take 1 tablet (40 mg total) by mouth daily.  Dispense: 90 tablet; Refill: 1  4. Atherosclerosis of native coronary artery of native heart without angina pectoris - quinapril (ACCUPRIL) 10 MG tablet; Take 1 tablet (10 mg total) by mouth daily.  Dispense: 90 tablet; Refill:  1 - atorvastatin (LIPITOR) 40 MG tablet; Take 1 tablet (40 mg total) by mouth daily.  Dispense: 90 tablet; Refill: 1 - atenolol (TENORMIN) 50 MG tablet; Take 1 tablet (50 mg total) by mouth daily.  Dispense: 90 tablet; Refill: 1  5. Primary osteoarthritis involving multiple joints Keep active; stable   6. Vitamin D deficiency Continue supplementation   7. Hay fever Stable with certirizine    Follow Up Instructions: Follow up in 4 months  around mid July   I discussed the assessment and treatment plan with the patient. The patient was provided an opportunity to ask questions and all were answered. The patient agreed with the plan and demonstrated an understanding of the instructions.   The patient was advised to call back or seek an in-person evaluation if the symptoms worsen or if the condition fails to improve as anticipated.  I provided 18 minutes of non-face-to-face time during this encounter.   Fredderick Severance, NP

## 2019-01-16 NOTE — Progress Notes (Signed)
Virtual Visit via Telephone Note  I connected withRichard P Fischer on 01/16/19 at  8:40 AM EDT by telephoneand verified that I am speaking with the correct person using two identifiers.  Staff discussed the limitations, risks, security and privacy concerns of performing an evaluation and management service by telephone and the availability of in person appointments. I also discussed with the patient that there may be a patient responsible charge related to this service. The patient expressed understanding and agreed to proceed.   History of Present Illness: Hypertension Prescribed atenolol 50mg  daily, quinapril 10mg  daily, states at last visit his blood pressure was getting to low and getting too low; seems to be more helpful Early in the morning they are higher 134/72 Later in the day 115/62- no longer notices lightheadedness maybe rarely. Atherosclerosis BP and lipid control, takes statin and 81mg  aspirin daily  The 10-year ASCVD risk score [name]., et al., 2013) is: 30.1%   Values used to calculate the score:     Age: 68 years     Sex: Male     Is Non-Hispanic African American: No     Diabetic: Yes     Tobacco smoker: No     Systolic Blood Pressure: 332 mmHg     Is BP treated: Yes     HDL Cholesterol: 51 mg/dL     Total Cholesterol: 134 mg/dL  Diabetes Mellitus Prescribed metformin 1000mg , steglatro 5mg , januvia 100mg  daily  Checks blood sugars once a day- this morning was 145; 129 7 day average; 30 day average 132.  Endorses polyphagia, denies polyuria and polyphagia.   RecentLabs       Lab Results  Component Value Date   HGBA1C 7.0 (H) 10/11/2018      Osteoarthritis Endorses pain and stiffness in knees, fingers joints, does not need anything for pain relief  Hyperlipidemia Prescribed atorvastatin 40mg  daily  RecentLabs       Lab Results  Component Value Date   CHOL 134 10/11/2018   HDL 51 10/11/2018   LDLCALC 64 10/11/2018   TRIG 107  10/11/2018   CHOLHDL 2.6 10/11/2018      Vitamin D Deficiency Recommended to take 2,000IU OTC supplementation  Seasonal Allergies Takes OTC antihistamine- zyrtec; endorses rhinorrhea. 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  Observations/Objective: Alert and oriented Able to speak in full sentences without difficulty.  See Vitals  Assessment and Plan:  1. Controlled type 2 diabetes mellitus without complication, without long-term current use of insulin (HCC) Discussed diet - Ertugliflozin L-PyroglutamicAc (STEGLATRO) 5 MG TABS; Take 1 tablet by mouth daily. For diabetes  Dispense: 90 tablet; Refill: 1 - metFORMIN (GLUCOPHAGE) 1000 MG tablet; Take 1 tablet (1,000 mg total) by mouth 2 (two) times daily with a meal.  Dispense: 180 tablet; Refill: 1 - sitaGLIPtin (JANUVIA) 100 MG tablet; Take 1 tablet (100 mg total) by mouth daily.  Dispense: 90 tablet; Refill: 1  2. Essential hypertension Well controlled,  - quinapril (ACCUPRIL) 10 MG tablet; Take 1 tablet (10 mg total) by mouth daily.  Dispense: 90 tablet; Refill: 1 - atenolol (TENORMIN) 50 MG tablet; Take 1 tablet (50 mg total) by mouth daily.  Dispense: 90 tablet; Refill: 1  3. Hyperlipidemia LDL goal <70 Discussed diet  - atorvastatin (LIPITOR) 40 MG tablet; Take 1 tablet (40 mg total) by mouth daily.  Dispense: 90 tablet; Refill: 1  4. Atherosclerosis of native coronary artery of native heart without angina pectoris - quinapril (ACCUPRIL) 10 MG tablet;  Take 1 tablet (10 mg total) by mouth daily.  Dispense: 90 tablet; Refill: 1 - atorvastatin (LIPITOR) 40 MG tablet; Take 1 tablet (40 mg total) by mouth daily.  Dispense: 90 tablet; Refill: 1 - atenolol (TENORMIN) 50 MG tablet; Take 1 tablet (50 mg total) by mouth daily.  Dispense: 90 tablet; Refill: 1  5. Primary osteoarthritis involving multiple joints Keep active; stable   6. Vitamin D deficiency Continue supplementation   7. Hay fever Stable with  certirizine    Follow Up Instructions: Follow up in 4 months around mid July  I discussed the assessment and treatment plan with the patient. The patient was provided an opportunity to ask questions and all were answered. The patient agreed with the plan and demonstrated an understanding of the instructions.  The patient was advised to call back or seek an in-person evaluation if the symptoms worsen or if the condition fails to improve as anticipated.  I provided 18 minutes of non-face-to-face time during this encounter.   Fredderick Severance, NP

## 2019-01-17 ENCOUNTER — Telehealth: Payer: Self-pay | Admitting: Family Medicine

## 2019-01-17 MED ORDER — GLUCOSE BLOOD VI STRP: ORAL_STRIP | 3 refills | Status: DC

## 2019-01-17 MED ORDER — GLUCOSE BLOOD VI STRP: ORAL_STRIP | 12 refills | Status: DC

## 2019-01-17 NOTE — Telephone Encounter (Signed)
Copied from [place] [id number]. Topic: Quick Communication - See Telephone Encounter >> Jan 17, 2019  3:07 PM Vernona Rieger wrote: CRM for notification. See Telephone encounter for: 01/17/19.  Patient states that he needs one touch ultra test strips - blue called in to [place]. Please Advise.

## 2019-01-17 NOTE — Telephone Encounter (Signed)
Refill sent.

## 2019-01-18 ENCOUNTER — Encounter: Payer: Self-pay | Admitting: Family Medicine

## 2019-01-21 IMAGING — CR DG HAND COMPLETE 3+V*R*
1 series · 3 of 3 positions shown · non-contrast
Comparison: None.

CLINICAL DATA: Right thumb swelling, pain

EXAM:
RIGHT HAND - COMPLETE 3+ VIEW

[Series 1: dg hand complete right · 0.14mm/px · 3 of 3 slices shown]
[im 1/3]
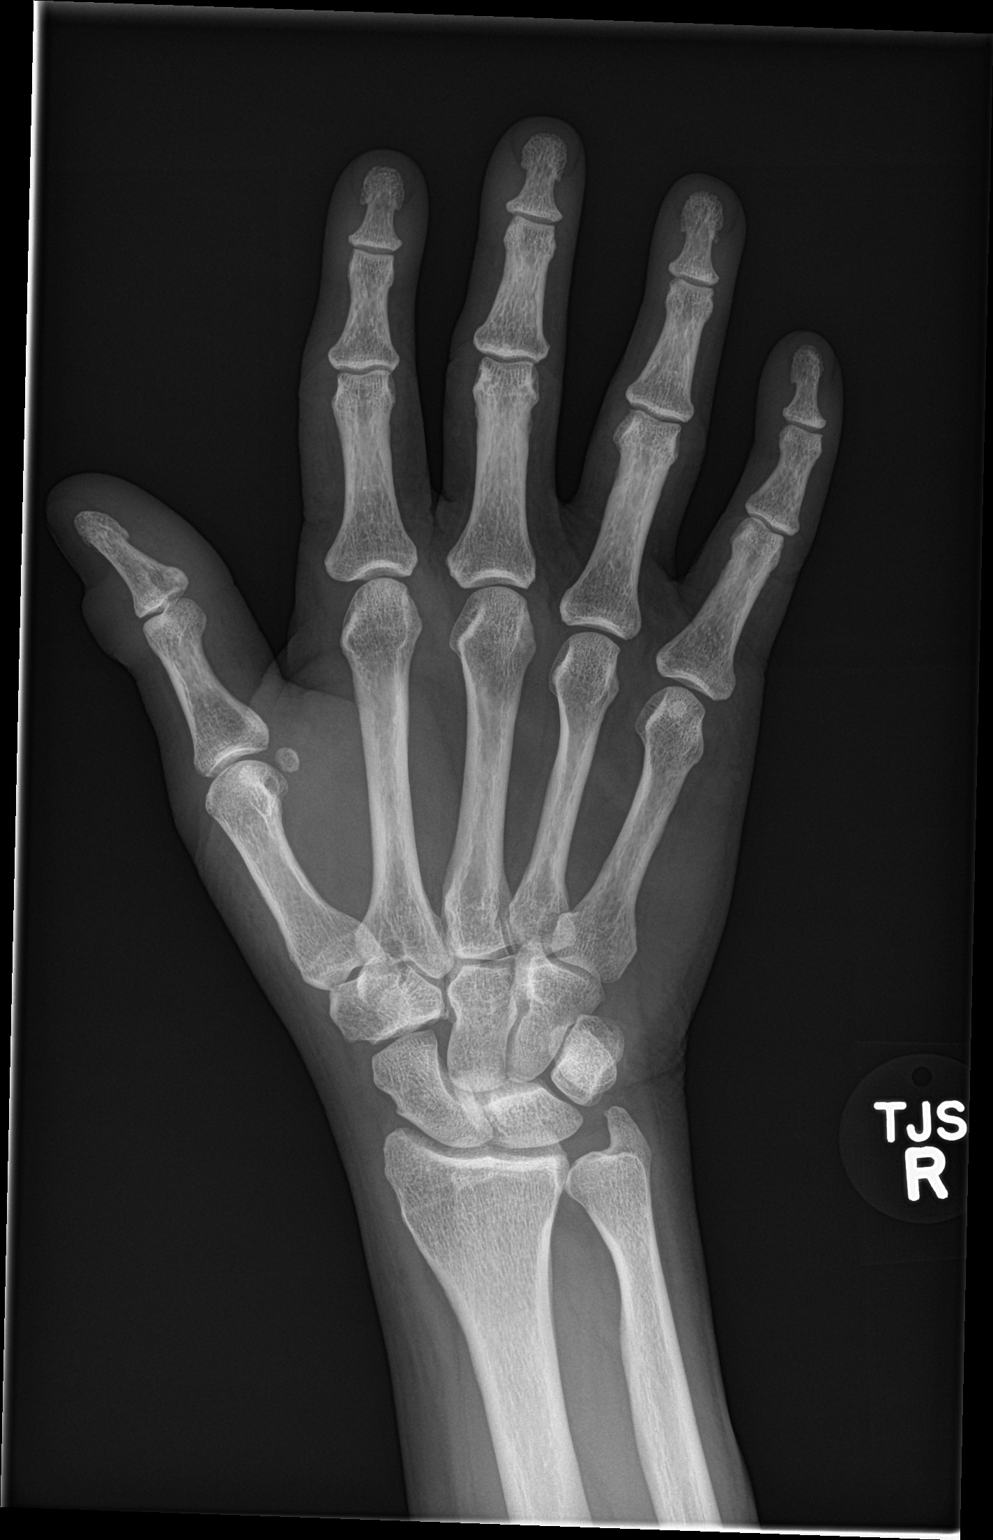
[im 2/3]
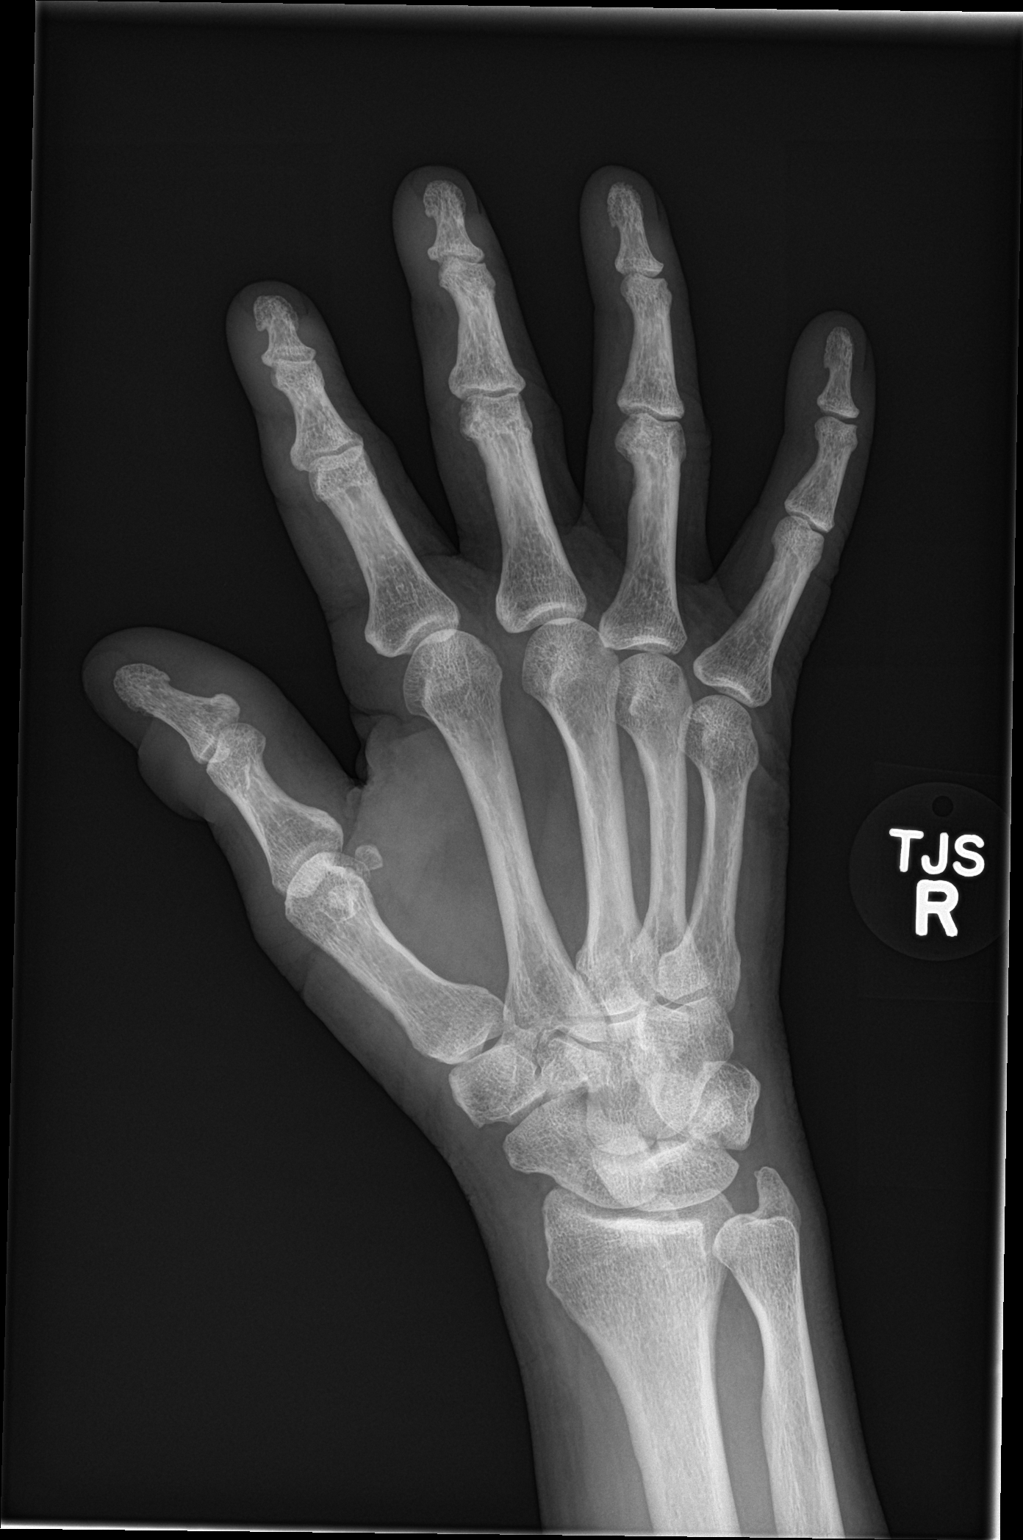
[im 3/3]
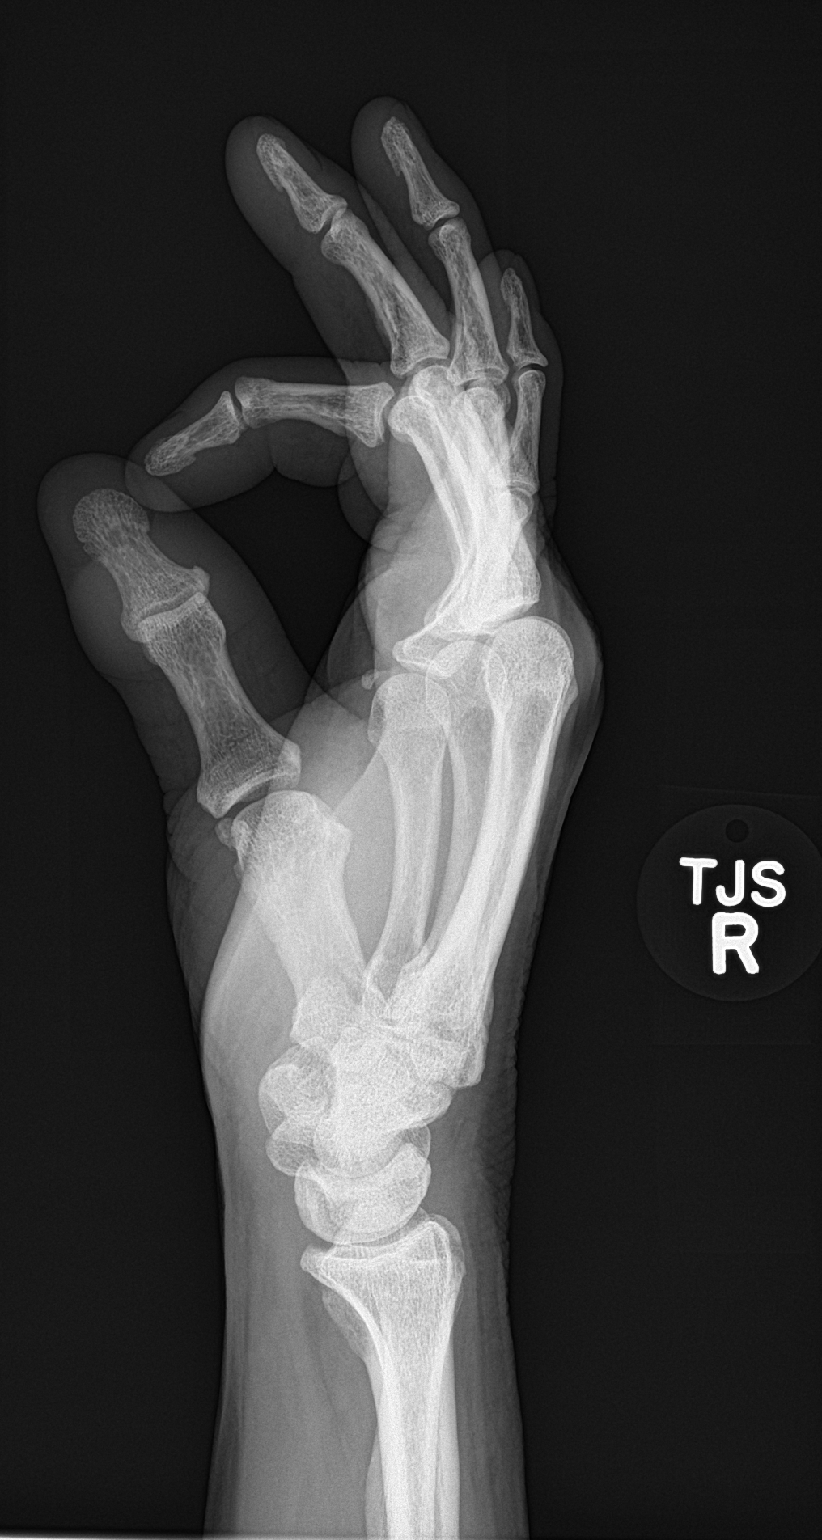

[3 of 3 positions shown; findings below may reference images not displayed]

FINDINGS: There is no evidence of fracture or dislocation. There is no
evidence of arthropathy or other focal bone abnormality. Soft
tissues are unremarkable.
IMPRESSION: Negative.

## 2019-05-01 ENCOUNTER — Ambulatory Visit: Payer: Medicare Other | Admitting: Family Medicine

## 2019-05-08 ENCOUNTER — Other Ambulatory Visit: Payer: Self-pay

## 2019-05-08 ENCOUNTER — Encounter: Payer: Self-pay | Admitting: Nurse Practitioner

## 2019-05-08 ENCOUNTER — Ambulatory Visit (INDEPENDENT_AMBULATORY_CARE_PROVIDER_SITE_OTHER): Payer: Medicare Other | Admitting: Nurse Practitioner

## 2019-05-08 VITALS — BP 122/60 | HR 69 | Temp 97.7°F | Resp 14 | Ht 68.0 in | Wt 172.5 lb

## 2019-05-08 DIAGNOSIS — E11618 Type 2 diabetes mellitus with other diabetic arthropathy: Secondary | ICD-10-CM

## 2019-05-08 DIAGNOSIS — E785 Hyperlipidemia, unspecified: Secondary | ICD-10-CM | POA: Diagnosis not present

## 2019-05-08 DIAGNOSIS — Z5181 Encounter for therapeutic drug level monitoring: Secondary | ICD-10-CM

## 2019-05-08 DIAGNOSIS — M150 Primary generalized (osteo)arthritis: Secondary | ICD-10-CM | POA: Diagnosis not present

## 2019-05-08 DIAGNOSIS — I2510 Atherosclerotic heart disease of native coronary artery without angina pectoris: Secondary | ICD-10-CM

## 2019-05-08 DIAGNOSIS — E559 Vitamin D deficiency, unspecified: Secondary | ICD-10-CM

## 2019-05-08 DIAGNOSIS — I10 Essential (primary) hypertension: Secondary | ICD-10-CM | POA: Diagnosis not present

## 2019-05-08 DIAGNOSIS — M159 Polyosteoarthritis, unspecified: Secondary | ICD-10-CM

## 2019-05-08 MED ORDER — STEGLATRO 5 MG PO TABS: 1.0000 | ORAL_TABLET | Freq: Every day | ORAL | 1 refills | Status: DC

## 2019-05-08 MED ORDER — METFORMIN HCL 1000 MG PO TABS: 1000.0000 mg | ORAL_TABLET | Freq: Two times a day (BID) | ORAL | 1 refills | Status: DC

## 2019-05-08 MED ORDER — ATENOLOL 50 MG PO TABS: 50.0000 mg | ORAL_TABLET | Freq: Every day | ORAL | 1 refills | Status: DC

## 2019-05-08 MED ORDER — QUINAPRIL HCL 10 MG PO TABS: 10.0000 mg | ORAL_TABLET | Freq: Every day | ORAL | 1 refills | Status: DC

## 2019-05-08 MED ORDER — ATORVASTATIN CALCIUM 40 MG PO TABS: 40.0000 mg | ORAL_TABLET | Freq: Every day | ORAL | 1 refills | Status: DC

## 2019-05-08 MED ORDER — SITAGLIPTIN PHOSPHATE 100 MG PO TABS: 100.0000 mg | ORAL_TABLET | Freq: Every day | ORAL | 1 refills | Status: DC

## 2019-05-08 NOTE — Progress Notes (Signed)
Name: Maurice Fischer   MRN: [id number]    DOB: 1951-01-30   Date:05/08/2019       Progress Note  Subjective  Chief Complaint  Chief Complaint  Patient presents with  . Follow-up    HPI  Hypertension Prescribed atenolol 50mg  daily, quinapril 10mg  daily, Takes medications as prescribed with rarely missed doses a month.  He is not compliant with low-salt diet but has improved Denies chest pain, headaches, blurry vision, dizziness, lightheadedness. BP Readings from Last 3 Encounters:  05/08/19 122/60  01/16/19 134/72  10/11/18 118/64    Atherosclerosis BP and lipid control, takes statin and 81mg  aspirin daily   Diabetes Mellitus Prescribed metformin 1000mg , steglatro 5mg , januvia 100mg  daily  Checks blood sugars once a day- this morning was 133; 95-170 Endorses polyphagia, denies polyuria and polyphagia.   Osteoarthritis Endorses pain and stiffness in knees, fingers joints, will start using voltaren gel not need anything for pain relief.   Hyperlipidemia Prescribed atorvastatin 40mg  nightly. He takes it daily as prescribed with  Rarely missed doses Denies myalgias  Lab Results  Component Value Date   CHOL 134 10/11/2018   HDL 51 10/11/2018   LDLCALC 64 10/11/2018   TRIG 107 10/11/2018   CHOLHDL 2.6 10/11/2018    Vitamin D Deficiency He is taking 2,000IU OTC supplementation  Seasonal Allergies Takes OTC antihistamine- zyrtec; endorses rhinorrhea. 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   PHQ2/9: Depression screen [facility] 2/9 05/08/2019 01/16/2019 10/11/2018 07/09/2018 05/10/2018  Decreased Interest 0 0 0 0 0  Down, Depressed, Hopeless 0 0 0 0 0  PHQ - 2 Score 0 0 0 0 0  Altered sleeping 0 0 0 0 0  Tired, decreased energy 0 0 0 0 0  Change in appetite 0 0 0 0 0  Feeling bad or failure about yourself  0 0 0 0 0  Trouble concentrating 0 0 0 0 0  Moving slowly or fidgety/restless 0 0 0 0 0  Suicidal thoughts 0 0 0 0 0  PHQ-9 Score 0 0 0 0 0   Difficult doing work/chores Not difficult at all Not difficult at all Not difficult at all Not difficult at all Not difficult at all  Some recent data might be hidden     PHQ reviewed. Negative  Patient Active Problem List   Diagnosis Date Noted  . Coronary artery disease involving native coronary artery of native heart without angina pectoris 07/09/2018  . Shoulder impingement 09/28/2016  . DM (diabetes mellitus), type 2, uncontrolled ([place]) 07/05/2016  . Overweight (BMI 25.0-29.9) 04/04/2016  . Hypertension 04/04/2016  . Vitamin D deficiency 04/04/2016  . Hyperlipidemia LDL goal <70 09/16/2015  . Osteoarthritis 09/16/2015  . 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 Hypertriglyceridemia 05/28/2015  . Inguinal hernia 05/28/2015  . Umbilical hernia 50/93/2671  . Atherosclerosis of coronary artery 02/08/2007  . LBP (low back pain) 02/08/2007    Past Medical History:  Diagnosis Date  . Diabetes mellitus without complication ([place])   . Diverticulosis 2004  . Heart disease   . Hyperlipidemia   . Hypertension   . Inguinal hernia 2015   right  . Umbilical hernia [id number]    Past Surgical History:  Procedure Laterality Date  . blister cut open on right thumb   03/2017  . COLONOSCOPY  2004  . [place]?  [name] HERNIA REPAIR  09/98/3382   umbilical hernia/ Dr Jamal Collin  . HERNIA REPAIR  01/13/2014   right inguinal hernia repair/ Dr  Sankar  . TONSILLECTOMY  1957?    Social History   Tobacco Use  . Smoking status: Former Smoker    Packs/day: 2.00    Years: 20.00    Pack years: 40.00    Types: Cigarettes    Quit date: 10/08/1992    Years since quitting: 26.5  . Smokeless tobacco: Never Used  . Tobacco comment: smoking cessation materials not required  Substance Use Topics  . Alcohol use: Yes    Alcohol/week: 7.0 standard drinks    Types: 7 Standard drinks or equivalent per week     Current Outpatient Medications:  .  aspirin 81 MG tablet, Take 81 mg by mouth daily., Disp: , Rfl:   .  atenolol (TENORMIN) 50 MG tablet, Take 1 tablet (50 mg total) by mouth daily., Disp: 90 tablet, Rfl: 1 .  atorvastatin (LIPITOR) 40 MG tablet, Take 1 tablet (40 mg total) by mouth daily., Disp: 90 tablet, Rfl: 1 .  Cholecalciferol (VITAMIN D3) 2000 units TABS, Take 1 tablet by mouth daily., Disp: , Rfl:  .  Ertugliflozin L-PyroglutamicAc (STEGLATRO) 5 MG TABS, Take 1 tablet by mouth daily. For diabetes, Disp: 90 tablet, Rfl: 1 .  metFORMIN (GLUCOPHAGE) 1000 MG tablet, Take 1 tablet (1,000 mg total) by mouth 2 (two) times daily with a meal., Disp: 180 tablet, Rfl: 1 .  Multiple Vitamin (MULTIVITAMIN) tablet, Take 1 tablet by mouth daily., Disp: , Rfl:  .  quinapril (ACCUPRIL) 10 MG tablet, Take 1 tablet (10 mg total) by mouth daily., Disp: 90 tablet, Rfl: 1 .  sitaGLIPtin (JANUVIA) 100 MG tablet, Take 1 tablet (100 mg total) by mouth daily., Disp: 90 tablet, Rfl: 1 .  co-enzyme Q-10 30 MG capsule, Take 30 mg by mouth daily., Disp: , Rfl:  .  glucose blood (ONE TOUCH ULTRA TEST) test strip, Use as instructed to check fingerstick blood sugars once a day (if desired); LON 99 months, Dx E11.65, Disp: 100 each, Rfl: 3 .  glucose blood (ONE TOUCH ULTRA TEST) test strip, Use as instructed, Disp: 100 each, Rfl: 12  No Known Allergies  Review of Systems  Constitutional: Negative for chills, fever and malaise/fatigue.  HENT: Negative for congestion, sinus pain and sore throat.   Eyes: Negative for blurred vision.  Respiratory: Negative for cough and shortness of breath.   Cardiovascular: Negative for chest pain, palpitations and leg swelling.  Gastrointestinal: Negative for abdominal pain, constipation, diarrhea and nausea.  Genitourinary: Negative for dysuria.  Musculoskeletal: Positive for joint pain. Negative for falls.  Skin: Negative for rash.  Neurological: Negative for dizziness and headaches.  Endo/Heme/Allergies: Negative for polydipsia.  Psychiatric/Behavioral: The patient is not  nervous/anxious and does not have insomnia.       No other specific complaints in a complete review of systems (except as listed in HPI above).  Objective  Vitals:   05/08/19 0821  BP: 122/60  Pulse: 69  Resp: 14  Temp: 97.7 F (36.5 C)  TempSrc: Temporal  SpO2: 98%  Weight: 172 lb 8 oz (78.2 kg)  Height: 5\' 8"  (1.727 m)    Body mass index is 26.23 kg/m.  Nursing Note and Vital Signs reviewed.  Physical Exam Vitals signs reviewed.  Constitutional:      Appearance: He is well-developed.  HENT:     Head: Normocephalic and atraumatic.  Neck:     Musculoskeletal: Normal range of motion and neck supple.     Vascular: No carotid bruit.  Cardiovascular:     Heart  sounds: Normal heart sounds.  Pulmonary:     Effort: Pulmonary effort is normal.     Breath sounds: Normal breath sounds.  Abdominal:     General: Bowel sounds are normal.     Palpations: Abdomen is soft.     Tenderness: There is no abdominal tenderness.  Musculoskeletal: Normal range of motion.  Skin:    General: Skin is warm and dry.     Capillary Refill: Capillary refill takes less than 2 seconds.  Neurological:     Mental Status: He is alert and oriented to person, place, and time.     GCS: GCS eye subscore is 4. GCS verbal subscore is 5. GCS motor subscore is 6.     Sensory: No sensory deficit.  Psychiatric:        Speech: Speech normal.        Behavior: Behavior normal.        Thought Content: Thought content normal.        Judgment: Judgment normal.       No results found for this or any previous visit (from the past 48 hour(s)).  Assessment & Plan  1. 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 For diabetes  Dispense: 90 tablet; Refill: 1 - metFORMIN (GLUCOPHAGE) 1000 MG tablet; Take 1 tablet (1,000 mg total) by mouth 2  (two) times daily with a meal.  Dispense: 180 tablet; Refill: 1 - sitaGLIPtin (JANUVIA) 100 MG tablet; Take 1 tablet (100 mg total) by mouth daily.  Dispense: 90 tablet; Refill: 1  2. Essential hypertension stable - quinapril (ACCUPRIL) 10 MG tablet; Take 1 tablet (10 mg total) by mouth daily.  Dispense: 90 tablet; Refill: 1 - atenolol (TENORMIN) 50 MG tablet; Take 1 tablet (50 mg total) by mouth daily.  Dispense: 90 tablet; Refill: 1  3. Primary osteoarthritis involving multiple joints Discussed otc relief, declined pt   4. Hyperlipidemia LDL goal <70 - Lipid Profile - atorvastatin (LIPITOR) 40 MG tablet; Take 1 tablet (40 mg total) by mouth daily.  Dispense: 90 tablet; Refill: 1  5. Vitamin D deficiency Discussed continuing supplementation and increasing vitamin k in diet   6. Medication monitoring encounter - COMPLETE METABOLIC PANEL WITH GFR  7. Atherosclerosis of native coronary artery of native heart without angina pectoris - quinapril (ACCUPRIL) 10 MG tablet; Take 1 tablet (10 mg total) by mouth daily.  Dispense: 90 tablet; Refill: 1 - atorvastatin (LIPITOR) 40 MG tablet; Take 1 tablet (40 mg total) by mouth daily.  Dispense: 90 tablet; Refill: 1 - atenolol (TENORMIN) 50 MG tablet; Take 1 tablet (50 mg total) by mouth daily.  Dispense: 90 tablet; Refill: 1

## 2019-05-08 NOTE — Patient Instructions (Signed)
If you have not heard anything from my staff in a week about any orders/referrals/studies from today, please contact us here to follow-up [phone].   Include a bit of vitamin K in your diet-  Vitamin K is found in the following foods: Green leafy vegetables, such as kale, spinach, turnip greens, collards, Swiss chard, mustard greens, parsley, romaine, and green leaf lettuce. Vegetables such as Brussels sprouts, broccoli, cauliflower, and cabbage. Fish, liver, meat, eggs, and cereals (contain smaller amounts)

## 2019-05-14 LAB — HEMOGLOBIN A1C: Hemoglobin A1C: 8.4

## 2019-05-15 DIAGNOSIS — E785 Hyperlipidemia, unspecified: Secondary | ICD-10-CM | POA: Diagnosis not present

## 2019-05-15 DIAGNOSIS — Z5181 Encounter for therapeutic drug level monitoring: Secondary | ICD-10-CM | POA: Diagnosis not present

## 2019-05-15 DIAGNOSIS — E11618 Type 2 diabetes mellitus with other diabetic arthropathy: Secondary | ICD-10-CM | POA: Diagnosis not present

## 2019-05-16 LAB — COMPLETE METABOLIC PANEL WITH GFR
AG Ratio: 2.1 (calc) (ref 1.0–2.5)
ALT: 27 U/L (ref 9–46)
AST: 21 U/L (ref 10–35)
Albumin: 4.4 g/dL (ref 3.6–5.1)
Alkaline phosphatase (APISO): 44 U/L (ref 35–144)
BUN: 23 mg/dL (ref 7–25)
CO2: 28 mmol/L (ref 20–32)
Calcium: 9.7 mg/dL (ref 8.6–10.3)
Chloride: 101 mmol/L (ref 98–110)
Creat: 0.79 mg/dL (ref 0.70–1.25)
GFR, Est African American: 106 mL/min/{1.73_m2} (ref >=60)
GFR, Est Non African American: 92 mL/min/{1.73_m2} (ref >=60)
Globulin: 2.1 g/dL (calc) (ref 1.9–3.7)
Glucose, Bld: 141 mg/dL — ABNORMAL HIGH (ref 65–99)
Potassium: 4.5 mmol/L (ref 3.5–5.3)
Sodium: 134 mmol/L — ABNORMAL LOW (ref 135–146)
Total Bilirubin: 1.1 mg/dL (ref 0.2–1.2)
Total Protein: 6.5 g/dL (ref 6.1–8.1)

## 2019-05-16 LAB — HEMOGLOBIN A1C
Hgb A1c MFr Bld: 6.8 % of total Hgb — ABNORMAL HIGH (ref <5.7)
Mean Plasma Glucose: 148 (calc)
eAG (mmol/L): 8.2 (calc)

## 2019-05-16 LAB — LIPID PANEL
Cholesterol: 124 mg/dL (ref <200)
HDL: 48 mg/dL (ref >=40)
LDL Cholesterol (Calc): 59 mg/dL (calc)
Non-HDL Cholesterol (Calc): 76 mg/dL (calc) (ref <130)
Total CHOL/HDL Ratio: 2.6 (calc) (ref <5.0)
Triglycerides: 91 mg/dL (ref <150)

## 2019-05-17 ENCOUNTER — Ambulatory Visit: Payer: Medicare Other

## 2019-06-14 ENCOUNTER — Encounter: Payer: Self-pay | Admitting: Family Medicine

## 2019-06-27 ENCOUNTER — Ambulatory Visit: Payer: Medicare Other

## 2019-06-28 ENCOUNTER — Ambulatory Visit: Payer: Medicare Other

## 2019-07-23 DIAGNOSIS — I10 Essential (primary) hypertension: Secondary | ICD-10-CM | POA: Diagnosis not present

## 2019-07-23 DIAGNOSIS — I25118 Atherosclerotic heart disease of native coronary artery with other forms of angina pectoris: Secondary | ICD-10-CM | POA: Diagnosis not present

## 2019-07-23 DIAGNOSIS — E7800 Pure hypercholesterolemia, unspecified: Secondary | ICD-10-CM | POA: Diagnosis not present

## 2019-07-23 DIAGNOSIS — I209 Angina pectoris, unspecified: Secondary | ICD-10-CM | POA: Diagnosis not present

## 2019-07-23 DIAGNOSIS — Z87891 Personal history of nicotine dependence: Secondary | ICD-10-CM | POA: Diagnosis not present

## 2019-07-25 ENCOUNTER — Other Ambulatory Visit: Payer: Self-pay

## 2019-07-25 ENCOUNTER — Ambulatory Visit (INDEPENDENT_AMBULATORY_CARE_PROVIDER_SITE_OTHER): Payer: Medicare Other

## 2019-07-25 VITALS — BP 98/52 | HR 75 | Temp 97.7°F | Resp 16 | Ht 68.0 in | Wt 166.3 lb

## 2019-07-25 DIAGNOSIS — Z0000 Encounter for general adult medical examination without abnormal findings: Secondary | ICD-10-CM

## 2019-07-25 NOTE — Progress Notes (Signed)
Subjective:   Maurice Fischer is a 68 y.o. male who presents for Medicare Annual/Subsequent preventive examination.   Review of Systems:   Cardiac Risk Factors include: advanced age (>42men, >39 women);diabetes mellitus;dyslipidemia;male gender;hypertension     Objective:    Vitals: BP (!) 98/52 (BP Location: Left Arm, Patient Position: Sitting, Cuff Size: Normal)   Pulse 75   Temp 97.7 F (36.5 C) (Temporal)   Resp 16   Ht 5\' 8"  (1.727 m)   Wt 166 lb 4.8 oz (75.4 kg)   SpO2 96%   BMI 25.29 kg/m   Body mass index is 25.29 kg/m.  Advanced Directives 07/25/2019 05/10/2018 06/27/2017 04/13/2017 03/28/2017 03/27/2017 02/23/2017  Does Patient Have a Medical Advance Directive? No No No No No No No  Would patient like information on creating a medical advance directive? Yes (MAU/Ambulatory/Procedural Areas - Information given) Yes (MAU/Ambulatory/Procedural Areas - Information given) - - - - -    Tobacco Social History   Tobacco Use  Smoking Status Former Smoker  . Packs/day: 2.00  . Years: 20.00  . Pack years: 40.00  . Types: Cigarettes  . Quit date: 10/08/1992  . Years since quitting: 26.8  Smokeless Tobacco Never Used  Tobacco Comment   smoking cessation materials not required     Counseling given: Not Answered Comment: smoking cessation materials not required   Clinical Intake:  Pre-visit preparation completed: Yes  Pain : No/denies pain     BMI - recorded: 25.29 Nutritional Status: BMI 25 -29 Overweight Nutritional Risks: None Diabetes: Yes CBG done?: No Did pt. bring in CBG monitor from home?: No   Nutrition Risk Assessment:  Has the patient had any N/V/D within the last 2 months?  No  Does the patient have any non-healing wounds?  No  Has the patient had any unintentional weight loss or weight gain?  No   Diabetes:  Is the patient diabetic?  Yes  If diabetic, was a CBG obtained today?  No  Did the patient bring in their glucometer from home?  No  How  often do you monitor your CBG's? daily.   Financial Strains and Diabetes Management:  Are you having any financial strains with the device, your supplies or your medication? No .  Does the patient want to be seen by Chronic Care Management for management of their diabetes?  No  Would the patient like to be referred to a Nutritionist or for Diabetic Management?  No   Diabetic Exams:  Diabetic Eye Exam: Completed 08/22/18 negative retinopathy.   Diabetic Foot Exam: Completed 05/08/19.   How often do you need to have someone help you when you read instructions, pamphlets, or other written materials from your doctor or pharmacy?: 1 - Never  Interpreter Needed?: No  Information entered by :: Clemetine Marker LPN  Past Medical History:  Diagnosis Date  . Diabetes mellitus without complication ([place])   . Diverticulosis 2004  . Heart disease   . Hyperlipidemia   . Hypertension   . Inguinal hernia 2015   right  . Umbilical hernia [id number]   Past Surgical History:  Procedure Laterality Date  . blister cut open on right thumb   03/2017  . COLONOSCOPY  2004  . [place]?  [name] HERNIA REPAIR  [id number]   umbilical hernia/ Dr Jamal Collin  . HERNIA REPAIR  01/13/2014   right inguinal hernia repair/ Dr Jamal Collin  . TONSILLECTOMY  1957?   Family History  Problem Relation Age of Onset  .  Cancer Father        prostate  . Coronary artery disease Mother   . Healthy Sister   . Depression Brother    Social History   Socioeconomic History  . Marital status: Married    Spouse name: Declined  . Number of children: 0  . Years of education: some college  . Highest education level: 12th grade  Occupational History  . Occupation: Retired  Scientific laboratory technician  . Financial resource strain: Not hard at all  . Food insecurity    Worry: Never true    Inability: Never true  . Transportation needs    Medical: No    Non-medical: No  Tobacco Use  . Smoking status: Former Smoker    Packs/day: 2.00     Years: 20.00    Pack years: 40.00    Types: Cigarettes    Quit date: 10/08/1992    Years since quitting: 26.8  . Smokeless tobacco: Never Used  . Tobacco comment: smoking cessation materials not required  Substance and Sexual Activity  . Alcohol use: Yes    Alcohol/week: 7.0 standard drinks    Types: 7 Standard drinks or equivalent per week  . Drug use: No  . Sexual activity: Not Currently  Lifestyle  . Physical activity    Days per week: 2 days    Minutes per session: 60 min  . Stress: Not at all  Relationships  . Social Herbalist on phone: Patient refused    Gets together: Patient refused    Attends religious service: Patient refused    Active member of club or organization: Patient refused    Attends meetings of clubs or organizations: Patient refused    Relationship status: Married  Other Topics Concern  . Not on file  Social History Narrative  . Not on file    Outpatient Encounter Medications as of 07/25/2019  Medication Sig  . aspirin 81 MG tablet Take 81 mg by mouth daily.  [name] atenolol (TENORMIN) 50 MG tablet Take 1 tablet (50 mg total) by mouth daily.  [name] atorvastatin (LIPITOR) 40 MG tablet Take 1 tablet (40 mg total) by mouth daily.  . Cholecalciferol (VITAMIN D3) 2000 units TABS Take 1 tablet by mouth daily.  [name] Ertugliflozin L-PyroglutamicAc (STEGLATRO) 5 MG TABS Take 1 tablet by mouth daily. For diabetes  . glucose blood (ONE TOUCH ULTRA TEST) test strip Use as instructed to check fingerstick blood sugars once a day (if desired); LON 99 months, Dx E11.65  . metFORMIN (GLUCOPHAGE) 1000 MG tablet Take 1 tablet (1,000 mg total) by mouth 2 (two) times daily with a meal.  . Multiple Vitamin (MULTIVITAMIN) tablet Take 1 tablet by mouth daily.  . quinapril (ACCUPRIL) 10 MG tablet Take 1 tablet (10 mg total) by mouth daily.  . sitaGLIPtin (JANUVIA) 100 MG tablet Take 1 tablet (100 mg total) by mouth daily.  . [DISCONTINUED] co-enzyme Q-10 30 MG capsule Take 30  mg by mouth daily.  . [DISCONTINUED] glucose blood (ONE TOUCH ULTRA TEST) test strip Use as instructed   No facility-administered encounter medications on file as of 07/25/2019.     Activities of Daily Living In your present state of health, do you have any difficulty performing the following activities: 07/25/2019 05/08/2019  Hearing? N N  Comment declines hearing aids -  Vision? N N  Difficulty concentrating or making decisions? N N  Walking or climbing stairs? N N  Dressing or bathing? N N  Doing errands, shopping?  N N  Preparing Food and eating ? N -  Using the Toilet? N -  In the past six months, have you accidently leaked urine? N -  Do you have problems with loss of bowel control? N -  Managing your Medications? N -  Managing your Finances? N -  Housekeeping or managing your Housekeeping? N -  Some recent data might be hidden    Patient Care Team: Hubbard Hartshorn, FNP as PCP - General (Family Medicine)   Assessment:   This is a routine wellness examination for [place].  Exercise Activities and Dietary recommendations Current Exercise Habits: Home exercise routine, Type of exercise: walking;Other - see comments(golf), Time (Minutes): 60, Frequency (Times/Week): 2, Weekly Exercise (Minutes/Week): 120, Intensity: Moderate, Exercise limited by: None identified  Goals    . DIET - INCREASE WATER INTAKE     Recommend to drink at least 6-8 8oz glasses of water per day.       Fall Risk Fall Risk  07/25/2019 05/08/2019 01/16/2019 10/11/2018 07/09/2018  Falls in the past year? 0 0 0 0 No  Number falls in past yr: 0 0 - 0 -  Injury with Fall? 0 0 - 0 -  Risk for fall due to : - - - - -  Risk for fall due to: Comment - - - - -  Follow up Falls prevention discussed - - - -   FALL RISK PREVENTION PERTAINING TO THE HOME:  Any stairs in or around the home? Yes  If so, do they handrails? Yes   Home free of loose throw rugs in walkways, pet beds, electrical cords, etc? Yes   Adequate lighting in your home to reduce risk of falls? Yes   ASSISTIVE DEVICES UTILIZED TO PREVENT FALLS:  Life alert? No  Use of a cane, walker or w/c? No  Grab bars in the bathroom? Yes  Shower chair or bench in shower? No  Elevated toilet seat or a handicapped toilet? Yes   DME ORDERS:  DME order needed?  No   TIMED UP AND GO:  Was the test performed? Yes .  Length of time to ambulate 10 feet: 5 sec.   GAIT:  Appearance of gait: Gait stead-fast and without the use of an assistive device.   Education: Fall risk prevention has been discussed.  Intervention(s) required? No    Depression Screen PHQ 2/9 Scores 07/25/2019 05/08/2019 01/16/2019 10/11/2018  PHQ - 2 Score 0 0 0 0  PHQ- 9 Score - 0 0 0    Cognitive Function     6CIT Screen 07/25/2019 05/10/2018  What Year? 0 points 0 points  What month? 0 points 0 points  What time? 0 points 0 points  Count back from 20 0 points 0 points  Months in reverse 0 points 0 points  Repeat phrase 0 points 0 points  Total Score 0 0    Immunization History  Administered Date(s) Administered  . Influenza Split 08/16/2007  . Influenza, High Dose Seasonal PF 07/22/2015, 07/06/2016, 06/27/2017, 07/09/2018  . Influenza,inj,Quad PF,6+ Mos 06/26/2014  . Pneumococcal Conjugate-13 10/08/2014  . Pneumococcal Polysaccharide-23 10/08/2014, 05/10/2018  . Td 03/04/2013  . Zoster 06/28/2011  . Zoster Recombinat (Shingrix) 08/17/2018, 12/18/2018    Qualifies for Shingles Vaccine? Yes  Shingrix series completed  Tdap: Up to date  Flu Vaccine: Up to date  Pneumococcal Vaccine: Up to date   Screening Tests Health Maintenance  Topic Date Due  . INFLUENZA VACCINE  05/25/2019  . OPHTHALMOLOGY  EXAM  08/23/2019  . HEMOGLOBIN A1C  11/15/2019  . FOOT EXAM  05/07/2020  . TETANUS/TDAP  03/05/2023  . COLONOSCOPY  01/08/2024  . Hepatitis C Screening  Completed  . PNA vac Low Risk Adult  Completed   Cancer Screenings:  Colorectal  Screening: Completed 01/07/14. Repeat every 10 years  Lung Cancer Screening: (Low Dose CT Chest recommended if Age 51-80 years, 30 pack-year currently smoking OR have quit w/in 15years.) does not qualify.    Additional Screening:  Hepatitis C Screening: does qualify; Completed 02/23/17  Vision Screening: Recommended annual ophthalmology exams for early detection of glaucoma and other disorders of the eye. Is the patient up to date with their annual eye exam?  Yes  Who is the provider or what is the name of the office in which the pt attends annual eye exams? [facility]  Dental Screening: Recommended annual dental exams for proper oral hygiene  Community Resource Referral:  CRR required this visit?  No       Plan:    I have personally reviewed and addressed the Medicare Annual Wellness questionnaire and have noted the following in the patient's chart:  A. Medical and social history B. Use of alcohol, tobacco or illicit drugs  C. Current medications and supplements D. Functional ability and status E.  Nutritional status F.  Physical activity G. Advance directives H. List of other physicians I.  Hospitalizations, surgeries, and ER visits in previous 12 months J.  [place] such as hearing and vision if needed, cognitive and depression L. Referrals and appointments   In addition, I have reviewed and discussed with patient certain preventive protocols, quality metrics, and best practice recommendations. A written personalized care plan for preventive services as well as general preventive health recommendations were provided to patient.   Signed,  Clemetine Marker, LPN Nurse Health Advisor   Nurse Notes: none

## 2019-07-25 NOTE — Patient Instructions (Signed)
Maurice Fischer , Thank you for taking time to come for your Medicare Wellness Visit. I appreciate your ongoing commitment to your health goals. Please review the following plan we discussed and let me know if I can assist you in the future.   Screening recommendations/referrals: Colonoscopy: done 01/07/14 Recommended yearly ophthalmology/optometry visit for glaucoma screening and checkup Recommended yearly dental visit for hygiene and checkup  Vaccinations: Influenza vaccine: [facility] at [facility] Pneumococcal vaccine: done 05/10/18 Tdap vaccine: done 03/04/13 Shingles vaccine: Shingrix completed 12/18/18    Advanced directives: Advance directive discussed with you today. I have provided a copy for you to complete at home and have notarized. Once this is complete please bring a copy in to our office so we can scan it into your chart.  Conditions/risks identified: Keep up the great work!  Next appointment: Please follow up in one year for your Medicare Annual Wellness visit.    Preventive Care 29 Years and Older, Male Preventive care refers to lifestyle choices and visits with your health care provider that can promote health and wellness. What does preventive care include?  A yearly physical exam. This is also called an annual well check.  Dental exams once or twice a year.  Routine eye exams. Ask your health care provider how often you should have your eyes checked.  Personal lifestyle choices, including:  Daily care of your teeth and gums.  Regular physical activity.  Eating a healthy diet.  Avoiding tobacco and drug use.  Limiting alcohol use.  Practicing safe sex.  Taking low doses of aspirin every day.  Taking vitamin and mineral supplements as recommended by your health care provider. What happens during an annual well check? The services and screenings done by your health care provider during your annual well check will depend on your age, overall health, lifestyle risk  factors, and family history of disease. Counseling  Your health care provider may ask you questions about your:  Alcohol use.  Tobacco use.  Drug use.  Emotional well-being.  Home and relationship well-being.  Sexual activity.  Eating habits.  History of falls.  Memory and ability to understand (cognition).  Work and work Statistician. Screening  You may have the following tests or measurements:  Height, weight, and BMI.  Blood pressure.  Lipid and cholesterol levels. These may be checked every 5 years, or more frequently if you are over 76 years old.  Skin check.  Lung cancer screening. You may have this screening every year starting at age 48 if you have a 30-pack-year history of smoking and currently smoke or have quit within the past 15 years.  Fecal occult blood test (FOBT) of the stool. You may have this test every year starting at age 61.  Flexible sigmoidoscopy or colonoscopy. You may have a sigmoidoscopy every 5 years or a colonoscopy every 10 years starting at age 32.  Prostate cancer screening. Recommendations will vary depending on your family history and other risks.  Hepatitis C blood test.  Hepatitis B blood test.  Sexually transmitted disease (STD) testing.  Diabetes screening. This is done by checking your blood sugar (glucose) after you have not eaten for a while (fasting). You may have this done every 1-3 years.  Abdominal aortic aneurysm (AAA) screening. You may need this if you are a current or former smoker.  Osteoporosis. You may be screened starting at age 69 if you are at high risk. Talk with your health care provider about your test results, treatment options, and  if necessary, the need for more tests. Vaccines  Your health care provider may recommend certain vaccines, such as:  Influenza vaccine. This is recommended every year.  Tetanus, diphtheria, and acellular pertussis (Tdap, Td) vaccine. You may need a Td booster every 10  years.  Zoster vaccine. You may need this after age 52.  Pneumococcal 13-valent conjugate (PCV13) vaccine. One dose is recommended after age 82.  Pneumococcal polysaccharide (PPSV23) vaccine. One dose is recommended after age 40. Talk to your health care provider about which screenings and vaccines you need and how often you need them. This information is not intended to replace advice given to you by your health care provider. Make sure you discuss any questions you have with your health care provider. Document Released: 11/06/2015 Document Revised: 06/29/2016 Document Reviewed: 08/11/2015 Elsevier Interactive Patient Education  2017 [place] Prevention in the Home Falls can cause injuries. They can happen to people of all ages. There are many things you can do to make your home safe and to help prevent falls. What can I do on the outside of my home?  Regularly fix the edges of walkways and driveways and fix any cracks.  Remove anything that might make you trip as you walk through a door, such as a raised step or threshold.  Trim any bushes or trees on the path to your home.  Use bright outdoor lighting.  Clear any walking paths of anything that might make someone trip, such as rocks or tools.  Regularly check to see if handrails are loose or broken. Make sure that both sides of any steps have handrails.  Any raised decks and porches should have guardrails on the edges.  Have any leaves, snow, or ice cleared regularly.  Use sand or salt on walking paths during winter.  Clean up any spills in your garage right away. This includes oil or grease spills. What can I do in the bathroom?  Use night lights.  Install grab bars by the toilet and in the tub and shower. Do not use towel bars as grab bars.  Use non-skid mats or decals in the tub or shower.  If you need to sit down in the shower, use a plastic, non-slip stool.  Keep the floor dry. Clean up any water that  spills on the floor as soon as it happens.  Remove soap buildup in the tub or shower regularly.  Attach bath mats securely with double-sided non-slip rug tape.  Do not have throw rugs and other things on the floor that can make you trip. What can I do in the bedroom?  Use night lights.  Make sure that you have a light by your bed that is easy to reach.  Do not use any sheets or blankets that are too big for your bed. They should not hang down onto the floor.  Have a firm chair that has side arms. You can use this for support while you get dressed.  Do not have throw rugs and other things on the floor that can make you trip. What can I do in the kitchen?  Clean up any spills right away.  Avoid walking on wet floors.  Keep items that you use a lot in easy-to-reach places.  If you need to reach something above you, use a strong step stool that has a grab bar.  Keep electrical cords out of the way.  Do not use floor polish or wax that makes floors slippery. If you  must use wax, use non-skid floor wax.  Do not have throw rugs and other things on the floor that can make you trip. What can I do with my stairs?  Do not leave any items on the stairs.  Make sure that there are handrails on both sides of the stairs and use them. Fix handrails that are broken or loose. Make sure that handrails are as long as the stairways.  Check any carpeting to make sure that it is firmly attached to the stairs. Fix any carpet that is loose or worn.  Avoid having throw rugs at the top or bottom of the stairs. If you do have throw rugs, attach them to the floor with carpet tape.  Make sure that you have a light switch at the top of the stairs and the bottom of the stairs. If you do not have them, ask someone to add them for you. What else can I do to help prevent falls?  Wear shoes that:  Do not have high heels.  Have rubber bottoms.  Are comfortable and fit you well.  Are closed at the  toe. Do not wear sandals.  If you use a stepladder:  Make sure that it is fully opened. Do not climb a closed stepladder.  Make sure that both sides of the stepladder are locked into place.  Ask someone to hold it for you, if possible.  Clearly mark and make sure that you can see:  Any grab bars or handrails.  First and last steps.  Where the edge of each step is.  Use tools that help you move around (mobility aids) if they are needed. These include:  Canes.  Walkers.  Scooters.  Crutches.  Turn on the lights when you go into a dark area. Replace any light bulbs as soon as they burn out.  Set up your furniture so you have a clear path. Avoid moving your furniture around.  If any of your floors are uneven, fix them.  If there are any pets around you, be aware of where they are.  Review your medicines with your doctor. Some medicines can make you feel dizzy. This can increase your chance of falling. Ask your doctor what other things that you can do to help prevent falls. This information is not intended to replace advice given to you by your health care provider. Make sure you discuss any questions you have with your health care provider. Document Released: 08/06/2009 Document Revised: 03/17/2016 Document Reviewed: 11/14/2014 Elsevier Interactive Patient Education  2017 [employer].

## 2019-08-21 LAB — HM DIABETES EYE EXAM

## 2019-09-09 ENCOUNTER — Ambulatory Visit (INDEPENDENT_AMBULATORY_CARE_PROVIDER_SITE_OTHER): Payer: Medicare Other | Admitting: Family Medicine

## 2019-09-09 ENCOUNTER — Other Ambulatory Visit: Payer: Self-pay

## 2019-09-09 ENCOUNTER — Encounter: Payer: Self-pay | Admitting: Family Medicine

## 2019-09-09 VITALS — BP 120/60 | HR 92 | Temp 97.3°F | Resp 14 | Ht 68.0 in | Wt 168.4 lb

## 2019-09-09 DIAGNOSIS — E559 Vitamin D deficiency, unspecified: Secondary | ICD-10-CM

## 2019-09-09 DIAGNOSIS — M8949 Other hypertrophic osteoarthropathy, multiple sites: Secondary | ICD-10-CM

## 2019-09-09 DIAGNOSIS — I10 Essential (primary) hypertension: Secondary | ICD-10-CM | POA: Diagnosis not present

## 2019-09-09 DIAGNOSIS — E785 Hyperlipidemia, unspecified: Secondary | ICD-10-CM

## 2019-09-09 DIAGNOSIS — M159 Polyosteoarthritis, unspecified: Secondary | ICD-10-CM

## 2019-09-09 DIAGNOSIS — E1165 Type 2 diabetes mellitus with hyperglycemia: Secondary | ICD-10-CM

## 2019-09-09 DIAGNOSIS — J302 Other seasonal allergic rhinitis: Secondary | ICD-10-CM

## 2019-09-09 DIAGNOSIS — I2510 Atherosclerotic heart disease of native coronary artery without angina pectoris: Secondary | ICD-10-CM

## 2019-09-09 LAB — POCT GLYCOSYLATED HEMOGLOBIN (HGB A1C): HbA1c, POC (prediabetic range): 6.3 % (ref 5.7–6.4)

## 2019-09-09 NOTE — Progress Notes (Signed)
Name: Maurice Fischer   MRN: [id number]    DOB: [birth year]   Date:09/09/2019       Progress Note  Subjective  Chief Complaint  Chief Complaint  Patient presents with  . Diabetes    4 month follow up  . Hypertension  . Hyperlipidemia    HPI  Hypertension Prescribed atenolol 50mg  daily, quinapril 10mg  daily, Takes medications as prescribed with rarely missed doses a month.  He is not compliant with low-salt diet, has been eating mostly what he wants lately.  Exercising less because gyms are closed. Denies chest pain, shortness of breath, headaches, blurry vision, dizziness, lightheadedness. BP Readings from Last 3 Encounters:  09/09/19 120/60  07/25/19 (!) 98/52  05/08/19 122/60    Diabetes Mellitus Prescribed metformin 1000mg , steglatro 5mg , januvia 100mg  daily  Checks blood sugars once a day fasting in the mornings; tending to be 130-140's lately.  Not as compliant with his diet.  Endorses polyphagia, denies polyuria and polyphagia. Urine Micro is due at next visit after December 2020.  Eye exam is UTD.   He is on ACEI and Statin, and on Aspirin 81mg  daily.  Osteoarthritis Endorses pain and stiffness in knees, fingers joints, bought voltaren gel but hasn't really used it yet.  Otherwise states symptoms have not worsened.   Hyperlipidemia/Atherosclerosis Prescribed atorvastatin 40mg  nightly. He takes it daily as prescribed with  Rarely missed doses.  Denies myalgias. Sees Dr. Clayborn Bigness and had recent appointment, doing well.  Lipids at goal in July 2020. Lab Results  Component Value Date   CHOL 124 05/15/2019   HDL 48 05/15/2019   LDLCALC 59 05/15/2019   TRIG 91 05/15/2019   CHOLHDL 2.6 05/15/2019   Vitamin D Deficiency He is taking 2,000IU OTC supplementation and is doing well on this.  Does not notice lack of energy,  Seasonal Allergies  Takes OTC antihistamine- zyrtec; endorses rhinorrhea. 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 Does well in the  fall and winter - no concerns.   Patient Active Problem List   Diagnosis Date Noted  . Coronary artery disease involving native coronary artery of native heart without angina pectoris 07/09/2018  . Shoulder impingement 09/28/2016  . DM (diabetes mellitus), type 2, uncontrolled ([place]) 07/05/2016  . Overweight (BMI 25.0-29.9) 04/04/2016  . Hypertension 04/04/2016  . Vitamin D deficiency 04/04/2016  . Hyperlipidemia LDL goal <70 09/16/2015  . Osteoarthritis 09/16/2015  . 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 Hypertriglyceridemia 05/28/2015  . Inguinal hernia 05/28/2015  . Umbilical hernia 0000000  . Atherosclerosis of coronary artery 02/08/2007  . LBP (low back pain) 02/08/2007    Past Surgical History:  Procedure Laterality Date  . blister cut open on right thumb   03/2017  . COLONOSCOPY  2004  . [place]?  [name] HERNIA REPAIR  [id number]   umbilical hernia/ Dr Jamal Collin  . HERNIA REPAIR  01/13/2014   right inguinal hernia repair/ Dr Jamal Collin  . TONSILLECTOMY  1957?    Family History  Problem Relation Age of Onset  . Cancer Father        prostate  . Coronary artery disease Mother   . Healthy Sister   . Depression Brother     Social History   Socioeconomic History  . Marital status: Married    Spouse name: Declined  . Number of children: 0  . Years of education: some college  . Highest education level: 12th grade  Occupational History  . Occupation: Retired  Scientific laboratory technician  .  Financial resource strain: Not hard at all  . Food insecurity    Worry: Never true    Inability: Never true  . Transportation needs    Medical: No    Non-medical: No  Tobacco Use  . Smoking status: Former Smoker    Packs/day: 2.00    Years: 20.00    Pack years: 40.00    Types: Cigarettes    Quit date: 10/08/1992    Years since quitting: 26.9  . Smokeless tobacco: Never Used  . Tobacco comment: smoking cessation materials not required  Substance and Sexual Activity  . Alcohol use: Yes     Alcohol/week: 7.0 standard drinks    Types: 7 Standard drinks or equivalent per week  . Drug use: No  . Sexual activity: Not Currently  Lifestyle  . Physical activity    Days per week: 2 days    Minutes per session: 60 min  . Stress: Not at all  Relationships  . Social Herbalist on phone: Patient refused    Gets together: Patient refused    Attends religious service: Patient refused    Active member of club or organization: Patient refused    Attends meetings of clubs or organizations: Patient refused    Relationship status: Married  . Intimate partner violence    Fear of current or ex partner: No    Emotionally abused: No    Physically abused: No    Forced sexual activity: No  Other Topics Concern  . Not on file  Social History Narrative  . Not on file     Current Outpatient Medications:  .  aspirin 81 MG tablet, Take 81 mg by mouth daily., Disp: , Rfl:  .  atenolol (TENORMIN) 50 MG tablet, Take 1 tablet (50 mg total) by mouth daily., Disp: 90 tablet, Rfl: 1 .  atorvastatin (LIPITOR) 40 MG tablet, Take 1 tablet (40 mg total) by mouth daily., Disp: 90 tablet, Rfl: 1 .  Cholecalciferol (VITAMIN D3) 2000 units TABS, Take 1 tablet by mouth daily., Disp: , Rfl:  .  Ertugliflozin L-PyroglutamicAc (STEGLATRO) 5 MG TABS, Take 1 tablet by mouth daily. For diabetes, Disp: 90 tablet, Rfl: 1 .  glucose blood (ONE TOUCH ULTRA TEST) test strip, Use as instructed to check fingerstick blood sugars once a day (if desired); LON 99 months, Dx E11.65, Disp: 100 each, Rfl: 3 .  metFORMIN (GLUCOPHAGE) 1000 MG tablet, Take 1 tablet (1,000 mg total) by mouth 2 (two) times daily with a meal., Disp: 180 tablet, Rfl: 1 .  Multiple Vitamin (MULTIVITAMIN) tablet, Take 1 tablet by mouth daily., Disp: , Rfl:  .  quinapril (ACCUPRIL) 10 MG tablet, Take 1 tablet (10 mg total) by mouth daily., Disp: 90 tablet, Rfl: 1 .  sitaGLIPtin (JANUVIA) 100 MG tablet, Take 1 tablet (100 mg total) by mouth  daily., Disp: 90 tablet, Rfl: 1  No Known Allergies  I personally reviewed active problem list, medication list, allergies, notes from last encounter, lab results with the patient/caregiver today.   ROS  Ten systems reviewed and is negative except as mentioned in HPI  Objective  Vitals:   09/09/19 0733  BP: 120/60  Pulse: 92  Resp: 14  Temp: (!) 97.3 F (36.3 C)  TempSrc: Temporal  SpO2: 94%  Weight: 168 lb 6.4 oz (76.4 kg)  Height: 5\' 8"  (1.727 m)   Body mass index is 25.61 kg/m.  Physical Exam Constitutional: Patient appears well-developed and well-nourished. No distress.  HENT:  Head: Normocephalic and atraumatic.  Eyes: Conjunctivae and EOM are normal. No scleral icterus.   Neck: Normal range of motion. Neck supple. No JVD present. No thyromegaly present.  Cardiovascular: Normal rate, regular rhythm and normal heart sounds.  No murmur heard. No BLE edema. Pulmonary/Chest: Effort normal and breath sounds normal. No respiratory distress. Musculoskeletal: Normal range of motion, no joint effusions. No gross deformities Neurological: Pt is alert and oriented to person, place, and time. No cranial nerve deficit. Coordination, balance, strength, speech and gait are normal.  Skin: Skin is warm and dry. No rash noted. No erythema.  Psychiatric: Patient has a normal mood and affect. behavior is normal. Judgment and thought content normal.  No results found for this or any previous visit (from the past 72 hour(s)).  PHQ2/9: Depression screen [facility] 2/9 09/09/2019 07/25/2019 05/08/2019 01/16/2019 10/11/2018  Decreased Interest 0 0 0 0 0  Down, Depressed, Hopeless 0 0 0 0 0  PHQ - 2 Score 0 0 0 0 0  Altered sleeping 0 - 0 0 0  Tired, decreased energy 0 - 0 0 0  Change in appetite 0 - 0 0 0  Feeling bad or failure about yourself  0 - 0 0 0  Trouble concentrating 0 - 0 0 0  Moving slowly or fidgety/restless 0 - 0 0 0  Suicidal thoughts 0 - 0 0 0  PHQ-9 Score 0 - 0 0 0  Difficult  doing work/chores Not difficult at all - Not difficult at all Not difficult at all Not difficult at all  Some recent data might be hidden   PHQ-2/9 Result is negative.    Fall Risk: Fall Risk  09/09/2019 07/25/2019 05/08/2019 01/16/2019 10/11/2018  Falls in the past year? 0 0 0 0 0  Number falls in past yr: 0 0 0 - 0  Injury with Fall? 0 0 0 - 0  Risk for fall due to : - - - - -  Risk for fall due to: Comment - - - - -  Follow up Falls evaluation completed Falls prevention discussed - - -   Assessment & Plan  1. Controlled type 2 diabetes mellitus with hyperglycemia, without long-term current use of insulin (HCC) - A1C at goal. Continue current regimen. - POCT HgB A1C  2. Essential hypertension - Stable on current regimen, if lightheaded/dizziness will call back and may consider decreasing medication dosing.  3. Atherosclerosis of native coronary artery of native heart without angina pectoris - Compliant with atorvastatin.  Following up with Dr. Clayborn Bigness.  4. Coronary artery disease involving native coronary artery of native heart without angina pectoris - Compliant with atorvastatin.  Following up with Dr. Clayborn Bigness.  5. Hyperlipidemia LDL goal <70 - Compliant with atorvastatin.  Following up with Dr. Clayborn Bigness.  6. Vitamin D deficiency Taking daily supplement.  7. Primary osteoarthritis involving multiple joints - Doing well currently. Voltaren gel PRN  8. Seasonal allergic rhinitis, unspecified trigger - No issues in fall and winter; doing well with PRN antihistamine.

## 2019-09-09 NOTE — Patient Instructions (Signed)

## 2019-12-15 ENCOUNTER — Ambulatory Visit: Payer: Medicare Other | Attending: Internal Medicine

## 2019-12-15 DIAGNOSIS — Z23 Encounter for immunization: Secondary | ICD-10-CM | POA: Insufficient documentation

## 2019-12-15 NOTE — Progress Notes (Signed)
   Covid-19 Vaccination Clinic  Name:  Maurice Fischer    MRN: [id number] DOB: 11-16-1950  12/15/2019  [name] was observed post Covid-19 immunization for 15 minutes without incidence. He was provided with Vaccine Information Sheet and instruction to access the V-Safe system.   [name] was instructed to call 911 with any severe reactions post vaccine: [name] Difficulty breathing  . Swelling of your face and throat  . A fast heartbeat  . A bad rash all over your body  . Dizziness and weakness    Immunizations Administered    Name Date Dose VIS Date Route   Pfizer COVID-19 Vaccine 12/15/2019  9:43 AM 0.3 mL 10/04/2019 Intramuscular   Manufacturer: [place]   Lot: [id number]   [place]: [id number]

## 2020-01-06 NOTE — Progress Notes (Signed)
Patient ID: Maurice Fischer, male    DOB: 1950-12-08, 69 y.o.   MRN: [id number]  PCP: Hubbard Hartshorn, FNP  Chief Complaint  Patient presents with  . Follow-up  . Diabetes  . Hypertension  . Hyperlipidemia  . Osteoarthritis    Subjective:   Maurice Fischer is a 69 y.o. male, presents to clinic with CC of the following:  Chief Complaint  Patient presents with  . Follow-up  . Diabetes  . Hypertension  . Hyperlipidemia  . Osteoarthritis    HPI:  Patient is a 69 year old male who was last seen 09/09/2019 by Raelyn Ensign, and returns today for follow-up.  He denies any recent complaints.  Is due for his second Covid vaccination soon.  States he has been lazy [place] through this pandemic.   He has been followed for these issues:  Hypertension Medications - atenolol 50mg  daily, quinapril 10mg  daily, was reduced from 20 mg a while back due to lightheadedness episodes. Takes medications as prescribed, checks BP's at home., normally 112-120/60's Diet -  has been eating mostly what he wants lately.   Exercising less because gyms are closed. Not much exercise. Denies chest pain, shortness of breath, headaches, blurry vision, dizziness,  BP Readings from Last 3 Encounters:  01/07/20 122/64  09/09/19 120/60  07/25/19 (!) 98/52    Diabetes Mellitus Medications taking - metformin 1000mg , steglatro 5mg , januvia 100mg  daily  Checks blood sugars most days fasting in the mornings; tending to be 130-160's lately. 141 this am.  Not as compliant with his diet.  denies frequency, increased thirst, marked fatigue, numbness and tingling in the ext's Urine Micro is due this visit  Eye exam is UTD, last one Nov He is on ACEI and Statin, and on Aspirin 81mg  daily. Lab Results  Component Value Date   HGBA1C 6.3 09/09/2019  He noted he thinks the A1c will be up today with his less activity and less diet restrictions.  Osteoarthritis Endorses pain and stiffness in knees, fingers  joints,bought voltaren gel but hasn't used it yet.  States symptoms have not worsened, and are not limiting.  Hyperlipidemia/Atherosclerosis Prescribed atorvastatin 40mg nightly. He takes it daily as prescribed  Denies myalgias.  Sees Dr. Clayborn Bigness from cardiology and had recent appointment 07/23/2019, + h/o MI, doing well.   Lipids at goal in July 2020. Lab Results  Component Value Date   CHOL 124 05/15/2019   HDL 48 05/15/2019   LDLCALC 59 05/15/2019   TRIG 91 05/15/2019   CHOLHDL 2.6 05/15/2019   Vitamin D Deficiency He is taking2,000IU OTC supplementation and also noted he takes a multivitamin has some vitamin D in it. His last vitamin D check was in 2019, and was in the 40s.  Seasonal Allergies  Takes OTC antihistamine; has rhinorrhea. 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 Does well in the fall and winter, usually more problematic in the spring- no concerns.   Tob - Former smoker Alcohol - on average about one daily  He noted he used to see urology, and PSAs have always been excellent.  His last PSA was in 2019. He denies any recent urgency, hesitancy, or problems emptying. His urologist stopped doing prostate exams routinely, as he noted were uncomfortable for him, and have been following PSAs.  Agreed to check every couple years, and will be due again for another PSA in the fall of this year. Lab Results  Component Value Date   PSA1 1.0 09/16/2015  PSA 0.7 07/09/2018   PSA 0.6 02/23/2017     Patient Active Problem List   Diagnosis Date Noted  . Seasonal allergic rhinitis 09/09/2019  . Coronary artery disease involving native coronary artery of native heart without angina pectoris 07/09/2018  . Shoulder impingement 09/28/2016  . DM (diabetes mellitus), type 2, uncontrolled ([place]) 07/05/2016  . Overweight (BMI 25.0-29.9) 04/04/2016  . Hypertension 04/04/2016  . Vitamin D deficiency 04/04/2016  . Hyperlipidemia LDL goal <70 09/16/2015  .  Osteoarthritis 09/16/2015  . 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 Hypertriglyceridemia 05/28/2015  . Inguinal hernia 05/28/2015  . Umbilical hernia 0000000  . Atherosclerosis of coronary artery 02/08/2007  . LBP (low back pain) 02/08/2007      Current Outpatient Medications:  .  aspirin 81 MG tablet, Take 81 mg by mouth daily., Disp: , Rfl:  .  atenolol (TENORMIN) 50 MG tablet, Take 1 tablet (50 mg total) by mouth daily., Disp: 90 tablet, Rfl: 1 .  atorvastatin (LIPITOR) 40 MG tablet, Take 1 tablet (40 mg total) by mouth daily., Disp: 90 tablet, Rfl: 1 .  Cholecalciferol (VITAMIN D3) 2000 units TABS, Take 1 tablet by mouth daily., Disp: , Rfl:  .  Ertugliflozin L-PyroglutamicAc (STEGLATRO) 5 MG TABS, Take 1 tablet by mouth daily. For diabetes, Disp: 90 tablet, Rfl: 1 .  glucose blood (ONE TOUCH ULTRA TEST) test strip, Use as instructed to check fingerstick blood sugars once a day (if desired); LON 99 months, Dx E11.65, Disp: 100 each, Rfl: 3 .  metFORMIN (GLUCOPHAGE) 1000 MG tablet, Take 1 tablet (1,000 mg total) by mouth 2 (two) times daily with a meal., Disp: 180 tablet, Rfl: 1 .  Multiple Vitamin (MULTIVITAMIN) tablet, Take 1 tablet by mouth daily., Disp: , Rfl:  .  quinapril (ACCUPRIL) 10 MG tablet, Take 1 tablet (10 mg total) by mouth daily., Disp: 90 tablet, Rfl: 1 .  sitaGLIPtin (JANUVIA) 100 MG tablet, Take 1 tablet (100 mg total) by mouth daily., Disp: 90 tablet, Rfl: 1 .  glucose blood (ONETOUCH ULTRA) test strip, Use as instructed, Disp: 100 each, Rfl: 12   No Known Allergies   Past Surgical History:  Procedure Laterality Date  . blister cut open on right thumb   03/2017  . COLONOSCOPY  2004  . [place]?  [name] HERNIA REPAIR  [id number]   umbilical hernia/ Dr Jamal Collin  . HERNIA REPAIR  01/13/2014   right inguinal hernia repair/ Dr Jamal Collin  . TONSILLECTOMY  1957?     Family History  Problem Relation Age of Onset  . Cancer Father        prostate  . Coronary artery  disease Mother   . Healthy Sister   . Depression Brother      Social History   Tobacco Use  . Smoking status: Former Smoker    Packs/day: 2.00    Years: 20.00    Pack years: 40.00    Types: Cigarettes    Quit date: 10/08/1992    Years since quitting: 27.2  . Smokeless tobacco: Never Used  . Tobacco comment: smoking cessation materials not required  Substance Use Topics  . Alcohol use: Yes    Alcohol/week: 7.0 standard drinks    Types: 7 Standard drinks or equivalent per week    With staff assistance, above reviewed with the patient today.  ROS: As per HPI, otherwise no specific complaints on a limited and focused system review   No results found for this or any previous visit (from  the past 72 hour(s)).   PHQ2/9: Depression screen [facility] 2/9 01/07/2020 09/09/2019 07/25/2019 05/08/2019 01/16/2019  Decreased Interest 0 0 0 0 0  Down, Depressed, Hopeless 0 0 0 0 0  PHQ - 2 Score 0 0 0 0 0  Altered sleeping 1 0 - 0 0  Tired, decreased energy 0 0 - 0 0  Change in appetite 0 0 - 0 0  Feeling bad or failure about yourself  0 0 - 0 0  Trouble concentrating 0 0 - 0 0  Moving slowly or fidgety/restless 0 0 - 0 0  Suicidal thoughts 0 0 - 0 0  PHQ-9 Score 1 0 - 0 0  Difficult doing work/chores Not difficult at all Not difficult at all - Not difficult at all Not difficult at all  Some recent data might be hidden   PHQ-2/9 Result is neg  Fall Risk: Fall Risk  01/07/2020 09/09/2019 07/25/2019 05/08/2019 01/16/2019  Falls in the past year? 0 0 0 0 0  Number falls in past yr: 0 0 0 0 -  Injury with Fall? 0 0 0 0 -  Risk for fall due to : - - - - -  Risk for fall due to: Comment - - - - -  Follow up - Falls evaluation completed Falls prevention discussed - -      Objective:   Vitals:   01/07/20 0752  BP: 122/64  Pulse: 69  Resp: 16  Temp: (!) 97.5 F (36.4 C)  TempSrc: Temporal  SpO2: 97%  Weight: 175 lb (79.4 kg)  Height: 5\' 8"  (1.727 m)    Body mass index is 26.61  kg/m.  Physical Exam   NAD, masked, pleasant HEENT - [place]/AT, sclera anicteric, + glasses,  PERRL, EOMI, conj - non-inj'ed, TM's and canals clear, pharynx clear Neck - supple, no adenopathy, no TM, carotids 2+ and = without bruits bilat Car - RRR without m/g/r Pulm- RR and effort normal at rest, CTA without wheeze or rales Abd - soft, NT, ND, BS+,  no masses, no obvious HSM Back - no CVA tenderness Skin- no rash noted on exposed areas Ext - no LE edema, no active joints Diabetic foot exam: No skin breakdown, ulcers Normal DP pulses Normal sensation to light touch  Monofilament testing within normal limits Neuro/psychiatric - affect was not flat, appropriate with conversation  Alert and oriented  Grossly non-focal - good strength on testing extremities, sensation intact to LT in distal extremities  Speech and gait are normal   Results for orders placed or performed in visit on 09/09/19  POCT HgB A1C  Result Value Ref Range   Hemoglobin A1C     HbA1c POC (<> result, manual entry)     HbA1c, POC (prediabetic range) 6.3 5.7 - 6.4 %   HbA1c, POC (controlled diabetic range)         Assessment & Plan:   1. Essential hypertension Blood pressures remain well controlled on his medication regimen. Continue to monitor, including with his blood pressure checks at home. - BASIC METABOLIC PANEL WITH GFR  2. Uncontrolled type 2 diabetes mellitus with hyperglycemia ([place]) He is concerned his A1c will be slightly higher, and discussed the importance of trying to help with diet modifications as well as increasing physical activity levels.  This will help with weight maintenance as he noted he has put on about 7 pounds in the fairly recent past Wt Readings from Last 3 Encounters:  01/07/20 175 lb (79.4 kg)  09/09/19 168 lb 6.4 oz (76.4 kg)  07/25/19 166 lb 4.8 oz (75.4 kg)  Agreed to check the labs this morning and await those results. Continue his diabetes medication regimen presently. Did  refill his request for test strips to continue to check blood sugars at home as he is doing presently. - Hemoglobin A1c - Microalbumin / creatinine urine ratio - BASIC METABOLIC PANEL WITH GFR - glucose blood (ONETOUCH ULTRA) test strip; Use as instructed  Dispense: 100 each; Refill: 12  3. Hyperlipidemia LDL goal <70 Continue the statin product,  will recheck lipids again on follow-up in about 4 months time.  4. Atherosclerosis of native coronary artery of native heart without angina pectoris On a statin and a low-dose aspirin product,  Continue with cardiology follow-up.  5. Primary osteoarthritis involving multiple joints Doing well to date, has a Voltaren gel product use as needed in the future if symptoms are increasing.  6. Vitamin D deficiency Continue vitamin D supplementation, and will check a vitamin D level again in the near future.  7. Seasonal allergic rhinitis, unspecified trigger Continue his current management   Agreed to follow-up in approximately 4 months time, sooner as needed. He noted in the past he would get printed out prescriptions, as he noted he is on his last refills of a lot of his medications, and then just take them to the pharmacy when he needed it. Agreed to handle this electronically, and when prescriptions are due, I will be notified and can refill to his pharmacy electronically to help.  Hopefully this will be more convenient, and work effectively.    Towanda Malkin, MD 01/07/20 8:51 AM

## 2020-01-07 ENCOUNTER — Encounter: Payer: Self-pay | Admitting: Internal Medicine

## 2020-01-07 ENCOUNTER — Ambulatory Visit: Payer: Medicare Other | Attending: Internal Medicine

## 2020-01-07 ENCOUNTER — Ambulatory Visit (INDEPENDENT_AMBULATORY_CARE_PROVIDER_SITE_OTHER): Payer: Medicare Other | Admitting: Internal Medicine

## 2020-01-07 ENCOUNTER — Other Ambulatory Visit: Payer: Self-pay

## 2020-01-07 VITALS — BP 122/64 | HR 69 | Temp 97.5°F | Resp 16 | Ht 68.0 in | Wt 175.0 lb

## 2020-01-07 DIAGNOSIS — I2510 Atherosclerotic heart disease of native coronary artery without angina pectoris: Secondary | ICD-10-CM | POA: Diagnosis not present

## 2020-01-07 DIAGNOSIS — E1165 Type 2 diabetes mellitus with hyperglycemia: Secondary | ICD-10-CM

## 2020-01-07 DIAGNOSIS — E559 Vitamin D deficiency, unspecified: Secondary | ICD-10-CM

## 2020-01-07 DIAGNOSIS — M159 Polyosteoarthritis, unspecified: Secondary | ICD-10-CM

## 2020-01-07 DIAGNOSIS — I10 Essential (primary) hypertension: Secondary | ICD-10-CM | POA: Diagnosis not present

## 2020-01-07 DIAGNOSIS — Z23 Encounter for immunization: Secondary | ICD-10-CM

## 2020-01-07 DIAGNOSIS — M8949 Other hypertrophic osteoarthropathy, multiple sites: Secondary | ICD-10-CM

## 2020-01-07 DIAGNOSIS — E785 Hyperlipidemia, unspecified: Secondary | ICD-10-CM

## 2020-01-07 DIAGNOSIS — J302 Other seasonal allergic rhinitis: Secondary | ICD-10-CM

## 2020-01-07 MED ORDER — ONETOUCH ULTRA VI STRP: ORAL_STRIP | 12 refills | Status: DC

## 2020-01-07 NOTE — Progress Notes (Signed)
   Covid-19 Vaccination Clinic  Name:  Maurice Fischer    MRN: [id number] DOB: 07-04-1951  01/07/2020  [name] was observed post Covid-19 immunization for 15 minutes without incident. He was provided with Vaccine Information Sheet and instruction to access the V-Safe system.   [name] was instructed to call 911 with any severe reactions post vaccine: [name] Difficulty breathing  . Swelling of face and throat  . A fast heartbeat  . A bad rash all over body  . Dizziness and weakness   Immunizations Administered    Name Date Dose VIS Date Route   Pfizer COVID-19 Vaccine 01/07/2020 -- -- --   [place] COVID-19 Vaccine 01/07/2020 10:02 AM 0.3 mL 10/04/2019 Intramuscular   Manufacturer: [place]   Lot: [id number]   [place]: [id number]

## 2020-01-08 ENCOUNTER — Telehealth: Payer: Self-pay

## 2020-01-08 LAB — BASIC METABOLIC PANEL WITH GFR
BUN: 18 mg/dL (ref 7–25)
CO2: 29 mmol/L (ref 20–32)
Calcium: 10.3 mg/dL (ref 8.6–10.3)
Chloride: 100 mmol/L (ref 98–110)
Creat: 0.8 mg/dL (ref 0.70–1.18)
GFR, Est African American: 105 mL/min/{1.73_m2} (ref >=60)
GFR, Est Non African American: 91 mL/min/{1.73_m2} (ref >=60)
Glucose, Bld: 141 mg/dL — ABNORMAL HIGH (ref 65–99)
Potassium: 5 mmol/L (ref 3.5–5.3)
Sodium: 139 mmol/L (ref 135–146)

## 2020-01-08 LAB — HEMOGLOBIN A1C
Hgb A1c MFr Bld: 7.4 % of total Hgb — ABNORMAL HIGH (ref <5.7)
Mean Plasma Glucose: 166 (calc)
eAG (mmol/L): 9.2 (calc)

## 2020-01-08 NOTE — Telephone Encounter (Signed)
   Copied from [place] [phone] [id number]. Topic: General - Other >> Jan 08, 2020 11:03 AM Keene Breath wrote: Reason for CRM: Patient is returning a call to [facility].   CB# [phone]

## 2020-01-08 NOTE — Telephone Encounter (Signed)
Patient given lab results by phone. Verbalizes understanding, no further questions.

## 2020-01-15 ENCOUNTER — Other Ambulatory Visit: Payer: Self-pay | Admitting: Emergency Medicine

## 2020-01-15 DIAGNOSIS — I10 Essential (primary) hypertension: Secondary | ICD-10-CM

## 2020-01-15 DIAGNOSIS — I2510 Atherosclerotic heart disease of native coronary artery without angina pectoris: Secondary | ICD-10-CM

## 2020-01-15 DIAGNOSIS — E11618 Type 2 diabetes mellitus with other diabetic arthropathy: Secondary | ICD-10-CM

## 2020-01-15 MED ORDER — QUINAPRIL HCL 10 MG PO TABS: 10.0000 mg | ORAL_TABLET | Freq: Every day | ORAL | 1 refills | Status: DC

## 2020-01-15 MED ORDER — SITAGLIPTIN PHOSPHATE 100 MG PO TABS: 100.0000 mg | ORAL_TABLET | Freq: Every day | ORAL | 1 refills | Status: DC

## 2020-01-15 MED ORDER — METFORMIN HCL 1000 MG PO TABS: 1000.0000 mg | ORAL_TABLET | Freq: Two times a day (BID) | ORAL | 1 refills | Status: DC

## 2020-01-15 MED ORDER — STEGLATRO 5 MG PO TABS: 1.0000 | ORAL_TABLET | Freq: Every day | ORAL | 1 refills | Status: DC

## 2020-01-17 ENCOUNTER — Other Ambulatory Visit: Payer: Self-pay

## 2020-01-17 ENCOUNTER — Other Ambulatory Visit: Payer: Self-pay | Admitting: Internal Medicine

## 2020-01-17 DIAGNOSIS — I10 Essential (primary) hypertension: Secondary | ICD-10-CM

## 2020-01-17 DIAGNOSIS — I2510 Atherosclerotic heart disease of native coronary artery without angina pectoris: Secondary | ICD-10-CM

## 2020-01-17 DIAGNOSIS — E785 Hyperlipidemia, unspecified: Secondary | ICD-10-CM

## 2020-01-17 MED ORDER — ATORVASTATIN CALCIUM 40 MG PO TABS: 40.0000 mg | ORAL_TABLET | Freq: Every day | ORAL | 1 refills | Status: DC

## 2020-01-17 MED ORDER — ATENOLOL 50 MG PO TABS: 50.0000 mg | ORAL_TABLET | Freq: Every day | ORAL | 1 refills | Status: DC

## 2020-01-17 MED ORDER — GLUCOSE BLOOD VI STRP: ORAL_STRIP | 3 refills | Status: DC

## 2020-01-17 NOTE — Telephone Encounter (Signed)
Patient's atenolol, atorvastatin and one touch ultra test strips were sent electronically to [facility].  E-Prescribing Status: Receipt confirmed by pharmacy (01/17/2020 10:12 AM EDT)

## 2020-02-26 ENCOUNTER — Telehealth: Payer: Self-pay | Admitting: Family Medicine

## 2020-02-26 NOTE — Chronic Care Management (AMB) (Signed)
  Chronic Care Management   Outreach Note  02/26/2020 Name: Maurice Fischer MRN: [id number] DOB: 10-10-1951  Maurice Fischer is a 69 y.o. year old male who is a primary care patient of Hubbard Hartshorn, FNP. I reached out to Drema Pry by phone today in response to a referral sent by Mr. [name]'s health plan.     An unsuccessful telephone outreach was attempted today. The patient was referred to the case management team for assistance with care management and care coordination.   Follow Up Plan: A HIPPA compliant phone message was left for the patient providing contact information and requesting a return call.  The care management team will reach out to the patient again over the next 7 days.  If patient returns call to provider office, please advise to call [facility] at [place], [place], [place], [place] [address] Direct Dial: [phone] [email or web address]: [email or web address]

## 2020-03-02 NOTE — Chronic Care Management (AMB) (Signed)
  Chronic Care Management   Note  03/02/2020 Name: KADAR CHANCE MRN: [id number] DOB: 12/14/1950  Dana Allan Pellum is a 69 y.o. year old male who is a primary care patient of Hubbard Hartshorn, FNP. I reached out to Drema Pry by phone today in response to a referral sent by Mr. Shamari Lofquist Requejo's health plan.     Mr. Peretti was given information about Chronic Care Management services today including:  1. CCM service includes personalized support from designated clinical staff supervised by his physician, including individualized plan of care and coordination with other care providers 2. 24/7 contact phone numbers for assistance for urgent and routine care needs. 3. Service will only be billed when office clinical staff spend 20 minutes or more in a month to coordinate care. 4. Only one practitioner may furnish and bill the service in a calendar month. 5. The patient may stop CCM services at any time (effective at the end of the month) by phone call to the office staff. 6. The patient will be responsible for cost sharing (co-pay) of up to 20% of the service fee (after annual deductible is met).  Patient agreed to services and verbal consent obtained.   Follow up plan: Telephone appointment with care management team member scheduled for:03/25/2020  Noreene Larsson, [place] [address], [place], [place] [address] Direct Dial: [phone] [email or web address]

## 2020-03-25 ENCOUNTER — Ambulatory Visit: Payer: Medicare Other | Admitting: Pharmacist

## 2020-03-25 ENCOUNTER — Other Ambulatory Visit: Payer: Self-pay

## 2020-03-25 DIAGNOSIS — E785 Hyperlipidemia, unspecified: Secondary | ICD-10-CM

## 2020-03-25 DIAGNOSIS — E1165 Type 2 diabetes mellitus with hyperglycemia: Secondary | ICD-10-CM

## 2020-03-25 NOTE — Chronic Care Management (AMB) (Signed)
Chronic Care Management Pharmacy  Name: NYGEL FARRIOR  MRN: [id number] DOB: May 07, 1951  Chief Complaint/ HPI  Drema Pry,  69 y.o. , male presents for their Initial CCM visit with the clinical pharmacist via telephone due to COVID-19 Pandemic.  PCP : Towanda Malkin, MD  Their chronic conditions include: DM, HTN, HLD  Office Visits: 3/16 HTN, [place], BP 122/64 P 69 Wt 175 BMI 26.6, Former smoke, VitD in MVI, LDL  Consult Visit:NA  Medications: Outpatient Encounter Medications as of 03/25/2020  Medication Sig  . aspirin 81 MG tablet Take 81 mg by mouth daily.  [name] atenolol (TENORMIN) 50 MG tablet Take 1 tablet (50 mg total) by mouth daily.  [name] atorvastatin (LIPITOR) 40 MG tablet Take 1 tablet (40 mg total) by mouth daily.  . Cholecalciferol (VITAMIN D3) 2000 units TABS Take 1 tablet by mouth daily.  [name] Ertugliflozin L-PyroglutamicAc (STEGLATRO) 5 MG TABS Take 1 tablet by mouth daily. For diabetes  . glucose blood (ONE TOUCH ULTRA TEST) test strip Use as instructed to check fingerstick blood sugars once a day (if desired); LON 99 months, Dx E11.65  . metFORMIN (GLUCOPHAGE) 1000 MG tablet Take 1 tablet (1,000 mg total) by mouth 2 (two) times daily with a meal.  . Multiple Vitamin (MULTIVITAMIN) tablet Take 1 tablet by mouth daily.  . quinapril (ACCUPRIL) 10 MG tablet Take 1 tablet (10 mg total) by mouth daily.  . sitaGLIPtin (JANUVIA) 100 MG tablet Take 1 tablet (100 mg total) by mouth daily.   No facility-administered encounter medications on file as of 03/25/2020.     Physical Activity: Unknown  . Days of Exercise per Week: 2 days  . Minutes of Exercise per Session: Not on file   Current Diagnosis/Assessment:  Goals Addressed            This Visit's Progress   . Chronic Care Management       CARE PLAN ENTRY  Current Barriers:  . Chronic Disease Management support, education, and care coordination needs related to Hypertension, Hyperlipidemia, and  Diabetes   Hypertension . Pharmacist Clinical Goal(s): o Over the next 90 days, patient will work with PharmD and providers to maintain BP goal <130/80 . Current regimen:  o Quinapril 10mg  . Interventions: o Counseled on moderate risk of infrequent hypotension . Patient self care activities - Over the next 90 days, patient will: o Check BP daily, document, and provide at future appointments o Ensure daily salt intake < 2300 mg/day o Stay hydrated  Hyperlipidemia . Pharmacist Clinical Goal(s): o Over the next 90 days, patient will work with PharmD and providers to maintain LDL goal < 70 . Current regimen:  o Atorvastatin 40mg  daily . Interventions: o None . Patient self care activities - Over the next 90 days, patient will: o Continue taking Lipitor o Maintain healthy lifestyle modifications  Diabetes . Pharmacist Clinical Goal(s): o Over the next 90 days, patient will work with PharmD and providers to achieve A1c goal <7% . Current regimen:  o Metformin 1000mg  twice daily o Steglatro 5mg  daily o Januvia 100mg  daily . Interventions: o Stop Steglatro o Start Jardiance 25mg  daily based on formulary . Patient self care activities - Over the next 90 days, patient will: o Check blood sugar once daily, document, and provide at future appointments o Contact provider with any episodes of hypoglycemia o Increase diet efforts and exercise to reach goal  Medication management . Pharmacist Clinical Goal(s): o Over the next 90 days, patient  will work with PharmD and providers to achieve optimal medication adherence . Current pharmacy: [facility] . Interventions o Comprehensive medication review performed. o Continue current medication management strategy . Patient self care activities - Over the next 90 days, patient will: o Focus on medication adherence by finding provider in [place] o Get new provider to change Steglatro to [place] for cost savings o Take medications as  prescribed o Report any questions or concerns to PharmD and/or provider(s)  Initial goal documentation       Diabetes   Recent Relevant Labs: Lab Results  Component Value Date/Time   HGBA1C 7.4 (H) 01/07/2020 08:45 AM   HGBA1C 6.3 09/09/2019 08:18 AM   HGBA1C 6.8 (H) 05/15/2019 08:32 AM   HGBA1C 8.4 05/14/2019 12:00 AM   MICROALBUR 0.5 10/11/2018 11:39 AM   MICROALBUR 1.0 06/27/2017 10:37 AM     Checking BG: Daily  Recent FBG Readings: 113 - 195 Patient is currently uncontrolled on the following medications: Steglatro, Januvia, metformin  Last diabetic Foot exam:  Lab Results  Component Value Date/Time   HMDIABEYEEXA No Retinopathy 08/21/2019 12:00 AM    Last diabetic Eye exam: No results found for: HMDIABFOOTEX   We discussed:  Not doing well with diet and exercise since Covid-19  SGLT2 tried Check insurance  Plan  D/c Stelgatro Start Jardiance 25mg  1 tab by mouth daily  Hypertension    Office blood pressures are  BP Readings from Last 3 Encounters:  01/07/20 122/64  09/09/19 120/60  07/25/19 (!) 98/52    Patient has failed these meds in the past: NA  Patient checks BP at home infrequently  Patient home BP readings are ranging: 112/60  We discussed   hypotension less frequently after quinapril decrease  Plan  Continue current medications   Hyperlipidemia   Lipid Panel     Component Value Date/Time   CHOL 124 05/15/2019 0832   CHOL 134 09/16/2015 0836   TRIG 91 05/15/2019 0832   HDL 48 05/15/2019 0832   HDL 47 09/16/2015 0836   LDLCALC 59 05/15/2019 0832     The ASCVD Risk score (Goff DC Jr., et al., 2013) failed to calculate for the following reasons:   The valid total cholesterol range is 130 to 320 mg/dL   Patient has failed these meds in past: [place]  Patient is currently controlled on the following medications:  . Lipitor 40mg  daily  We discussed:    At goal LDL < 70  Plan  Continue current medications   Medication  Management   Pt uses [facility] for all medications Uses pill box? Yes Pt endorses 100% compliance  We discussed:  Roxan Hockey not in network May need new provider.   Plan  Find in [facility] provider, maybe Laverna Peace, FNP Continue current medication management strategy  Follow up: 3 month phone visit  Milus Height, PharmD, [place], [facility] [phone]

## 2020-03-25 NOTE — Patient Instructions (Addendum)
Visit Information  Goals Addressed            This Visit's Progress   . Chronic Care Management       CARE PLAN ENTRY  Current Barriers:  . Chronic Disease Management support, education, and care coordination needs related to Hypertension, Hyperlipidemia, and Diabetes   Hypertension . Pharmacist Clinical Goal(s): o Over the next 90 days, patient will work with PharmD and providers to maintain BP goal <130/80 . Current regimen:  o Quinapril 10mg  . Interventions: o Counseled on moderate risk of infrequent hypotension . Patient self care activities - Over the next 90 days, patient will: o Check BP daily, document, and provide at future appointments o Ensure daily salt intake < 2300 mg/day o Stay hydrated  Hyperlipidemia . Pharmacist Clinical Goal(s): o Over the next 90 days, patient will work with PharmD and providers to maintain LDL goal < 70 . Current regimen:  o Atorvastatin 40mg  daily . Interventions: o None . Patient self care activities - Over the next 90 days, patient will: o Continue taking Lipitor o Maintain healthy lifestyle modifications  Diabetes . Pharmacist Clinical Goal(s): o Over the next 90 days, patient will work with PharmD and providers to achieve A1c goal <7% . Current regimen:  o Metformin 1000mg  twice daily o Steglatro 5mg  daily o Januvia 100mg  daily . Interventions: o Stop Steglatro o Start Jardiance 25mg  daily based on formulary . Patient self care activities - Over the next 90 days, patient will: o Check blood sugar once daily, document, and provide at future appointments o Contact provider with any episodes of hypoglycemia o Increase diet efforts and exercise to reach goal  Medication management . Pharmacist Clinical Goal(s): o Over the next 90 days, patient will work with PharmD and providers to achieve optimal medication adherence . Current pharmacy: [facility] . Interventions o Comprehensive medication review performed. o Continue current  medication management strategy . Patient self care activities - Over the next 90 days, patient will: o Focus on medication adherence by finding provider in [place] new provider to change Steglatro to [place] for cost savings o Take medications as prescribed o Report any questions or concerns to PharmD and/or provider(s)  Initial goal documentation        Mr. Marsico was given information about Chronic Care Management services today including:  1. CCM service includes personalized support from designated clinical staff supervised by his physician, including individualized plan of care and coordination with other care providers 2. 24/7 contact phone numbers for assistance for urgent and routine care needs. 3. Standard insurance, coinsurance, copays and deductibles apply for chronic care management only during months in which we provide at least 20 minutes of these services. Most insurances cover these services at 100%, however patients may be responsible for any copay, coinsurance and/or deductible if applicable. This service may help you avoid the need for more expensive face-to-face services. 4. Only one practitioner may furnish and bill the service in a calendar month. 5. The patient may stop CCM services at any time (effective at the end of the month) by phone call to the office staff.  Patient agreed to services and verbal consent obtained.   Print copy of patient instructions provided.  Telephone follow up appointment with pharmacy team member scheduled for: 3 months  Milus Height, PharmD, [place], [facility] [phone] Hyperglycemia Hyperglycemia occurs when the level of sugar (glucose) in the blood is too high. Glucose is a type of  sugar that provides the body's main source of energy. Certain hormones (insulin and glucagon) control the level of glucose in the blood. Insulin lowers blood glucose, and glucagon increases blood  glucose. Hyperglycemia can result from having too little insulin in the bloodstream, or from the body not responding normally to insulin. Hyperglycemia occurs most often in people who have diabetes (diabetes mellitus), but it can happen in people who do not have diabetes. It can develop quickly, and it can be life-threatening if it causes you to become severely dehydrated (diabetic ketoacidosis or hyperglycemic hyperosmolar state). Severe hyperglycemia is a medical emergency. What are the causes? If you have diabetes, hyperglycemia may be caused by:  Diabetes medicine.  Medicines that increase blood glucose or affect your diabetes control.  Not eating enough, or not eating often enough.  Changes in physical activity level.  Being sick or having an infection. If you have prediabetes or undiagnosed diabetes:  Hyperglycemia may be caused by those conditions. If you do not have diabetes, hyperglycemia may be caused by:  Certain medicines, including steroid medicines, beta-blockers, epinephrine, and thiazide diuretics.  Stress.  Serious illness.  Surgery.  Diseases of the pancreas.  Infection. What increases the risk? Hyperglycemia is more likely to develop in people who have risk factors for diabetes, such as:  Having a family member with diabetes.  Having a gene for type 1 diabetes that is passed from parent to child (inherited).  Living in an area with cold weather conditions.  Exposure to certain viruses.  Certain conditions in which the body's disease-fighting (immune) system attacks itself (autoimmune disorders).  Being overweight or obese.  Having an inactive (sedentary) lifestyle.  Having been diagnosed with insulin resistance.  Having a history of prediabetes, gestational diabetes, or polycystic ovarian syndrome (PCOS).  Being of American-Indian, African-American, Hispanic/Latino, or Asian/Pacific Islander descent. What are the signs or symptoms? Hyperglycemia  may not cause any symptoms. If you do have symptoms, they may include early warning signs, such as:  Increased thirst.  Hunger.  Feeling very tired.  Needing to urinate more often than usual.  Blurry vision. Other symptoms may develop if hyperglycemia gets worse, such as:  Dry mouth.  Loss of appetite.  Fruity-smelling breath.  Weakness.  Unexpected or rapid weight gain or weight loss.  Tingling or numbness in the hands or feet.  Headache.  Skin that does not quickly return to normal after being lightly pinched and released (poor skin turgor).  Abdominal pain.  Cuts or bruises that are slow to heal. How is this diagnosed? Hyperglycemia is diagnosed with a blood test to measure your blood glucose level. This blood test is usually done while you are having symptoms. Your health care provider may also do a physical exam and review your medical history. You may have more tests to determine the cause of your hyperglycemia, such as:  A fasting blood glucose (FBG) test. You will not be allowed to eat (you will fast) for at least 8 hours before a blood sample is taken.  An A1c (hemoglobin A1c) blood test. This provides information about blood glucose control over the previous 2-3 months.  An oral glucose tolerance test (OGTT). This measures your blood glucose at two times: ? After fasting. This is your baseline blood glucose level. ? Two hours after drinking a beverage that contains glucose. How is this treated? Treatment depends on the cause of your hyperglycemia. Treatment may include:  Taking medicine to regulate your blood glucose levels. If you take insulin  or other diabetes medicines, your medicine or dosage may be adjusted.  Lifestyle changes, such as exercising more, eating healthier foods, or losing weight.  Treating an illness or infection, if this caused your hyperglycemia.  Checking your blood glucose more often.  Stopping or reducing steroid medicines, if  these caused your hyperglycemia. If your hyperglycemia becomes severe and it results in hyperglycemic hyperosmolar state, you must be hospitalized and given IV fluids. Follow these instructions at home:  General instructions  Take over-the-counter and prescription medicines only as told by your health care provider.  Do not use any products that contain nicotine or tobacco, such as cigarettes and e-cigarettes. If you need help quitting, ask your health care provider.  Limit alcohol intake to no more than 1 drink per day for nonpregnant women and 2 drinks per day for men. One drink equals 12 oz of beer, 5 oz of wine, or 1 oz of hard liquor.  Learn to manage stress. If you need help with this, ask your health care provider.  Keep all follow-up visits as told by your health care provider. This is important. Eating and drinking   Maintain a healthy weight.  Exercise regularly, as directed by your health care provider.  Stay hydrated, especially when you exercise, get sick, or spend time in hot temperatures.  Eat healthy foods, such as: ? Lean proteins. ? Complex carbohydrates. ? Fresh fruits and vegetables. ? Low-fat dairy products. ? Healthy fats.  Drink enough fluid to keep your urine clear or pale yellow. If you have diabetes:  Make sure you know the symptoms of hyperglycemia.  Follow your diabetes management plan, as told by your health care provider. Make sure you: ? Take your insulin and medicines as directed. ? Follow your exercise plan. ? Follow your meal plan. Eat on time, and do not skip meals. ? Check your blood glucose as often as directed. Make sure to check your blood glucose before and after exercise. If you exercise longer or in a different way than usual, check your blood glucose more often. ? Follow your sick day plan whenever you cannot eat or drink normally. Make this plan in advance with your health care provider.  Share your diabetes management plan with  people in your workplace, school, and household.  Check your urine for ketones when you are ill and as told by your health care provider.  Carry a medical alert card or wear medical alert jewelry. Contact a health care provider if:  Your blood glucose is at or above 240 mg/dL (13.3 mmol/L) for 2 days in a row.  You have problems keeping your blood glucose in your target range.  You have frequent episodes of hyperglycemia. Get help right away if:  You have difficulty breathing.  You have a change in how you think, feel, or act (mental status).  You have nausea or vomiting that does not go away. These symptoms may represent a serious problem that is an emergency. Do not wait to see if the symptoms will go away. Get medical help right away. Call your local emergency services (911 in the [place] Do not drive yourself to the hospital. Summary  Hyperglycemia occurs when the level of sugar (glucose) in the blood is too high.  Hyperglycemia is diagnosed with a blood test to measure your blood glucose level. This blood test is usually done while you are having symptoms. Your health care provider may also do a physical exam and review your medical history.  If  you have diabetes, follow your diabetes management plan as told by your health care provider.  Contact your health care provider if you have problems keeping your blood glucose in your target range. This information is not intended to replace advice given to you by your health care provider. Make sure you discuss any questions you have with your health care provider. Document Revised: 06/27/2016 Document Reviewed: 06/27/2016 Elsevier Patient Education  [place].

## 2020-05-08 ENCOUNTER — Ambulatory Visit: Payer: Medicare Other | Admitting: Family Medicine

## 2020-05-08 ENCOUNTER — Ambulatory Visit: Payer: Medicare Other | Admitting: Internal Medicine

## 2020-05-14 NOTE — Progress Notes (Signed)
Patient ID: Maurice Fischer, male    DOB: 09/18/1951, 69 y.o.   MRN: [id number]  PCP: Towanda Malkin, MD  Chief Complaint  Patient presents with  . Follow-up    4 month f/u    Subjective:   Maurice Fischer is a 69 y.o. male, presents to clinic with CC of the following:  Chief Complaint  Patient presents with  . Follow-up    4 month f/u    HPI:  Patient is a 69 year old male His last visit with me was January 07, 2020 Follows up today.  Hypertension Medications - atenolol 50mg  daily, quinapril 10mg  daily, was reduced from 20 mg a while back due to lightheadedness episodes about 2 years ago.  Takes medications as prescribed,  checks BP's at home occasionally, normally 120's/ 70-80, one time a week has been lower, had to lean against the wall the other day as lightheaded some.  Denies chest pain,palpitations, shortness of breath,increased headaches, blurry vision, increased lower extremity swelling BP Readings from Last 3 Encounters:  05/15/20 124/72  01/07/20 122/64  09/09/19 120/60    Diabetes Mellitus Medications taking - metformin 1000mg , steglatro 5mg , januvia 100mg  daily (Steglatro -  t/c change to [place] after pharmacy consultation, he wanted to not do today, but further investigate the cost issue) Checks blood sugars most daysfasting in the mornings; 30 day ave - 131, 1 week ave was 120, 109 this am.  Tries to watch diet although admits not that prescription recently No reg exercise denies frequency, increased thirst, numbness and tingling in the ext's Eye exam is UTD, last one Nov He is on ACEI and Statin, Lab Results  Component Value Date   HGBA1C 7.4 (H) 01/07/2020   HGBA1C 6.3 09/09/2019   HGBA1C 6.8 (H) 05/15/2019   Lab Results  Component Value Date   MICROALBUR 0.5 10/11/2018   LDLCALC 59 05/15/2019   CREATININE 0.80 01/07/2020   Wt Readings from Last 3 Encounters:  05/15/20 170 lb 11.2 oz (77.4 kg)  01/07/20 175 lb (79.4  kg)  09/09/19 168 lb 6.4 oz (76.4 kg)   Has lost a little weight since March  Osteoarthritis No worsening symptoms recent past.  Noted pain and stiffness in knees, fingers joints in past,bought voltaren gel but hasn't used it yet.   Hyperlipidemia/Atherosclerosis Medication regimen-atorvastatin 40mg nightly.  He takes it daily as prescribed Denies myalgias, occas leg/foot cramps  Sees Dr. Clayborn Bigness from cardiology and had last appointment 07/23/2019, + h/o MI, doing well. Has f/u in Sept 2021 Lipids at goal in July 2020. Lab Results  Component Value Date   CHOL 124 05/15/2019   HDL 48 05/15/2019   LDLCALC 59 05/15/2019   TRIG 91 05/15/2019   CHOLHDL 2.6 05/15/2019    Vitamin D Deficiency He is taking2,000IU OTC supplementationand also noted he takes a multivitamin has some vitamin D in it.  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  Worse in the spring and fall  Tobacco-former smoker  He noted he used to see urology, and PSAs have always been excellent.  His last PSA was in 2019. His urologist stopped doing prostate exams routinely, as he noted were uncomfortable for him, and have been following PSAs.  Agreed to check every couple years. Lab Results  Component Value Date   PSA1 1.0 09/16/2015  PSA 0.7 07/09/2018   PSA 0.6 02/23/2017        1. Essential hypertension Blood pressures remain well controlled on his medication regimen. Continue to monitor, including with his blood pressure checks at home. - BASIC METABOLIC PANEL WITH GFR  2. Uncontrolled type 2 diabetes mellitus with hyperglycemia ([place]) He is concerned his A1c will be slightly higher, and discussed the importance of trying to help with diet modifications as well as increasing physical activity levels.  This will help with weight  maintenance as he noted he has put on about 7 pounds in the fairly recent past    Wt Readings from Last 3 Encounters:  01/07/20 175 lb (79.4 kg)  09/09/19 168 lb 6.4 oz (76.4 kg)  07/25/19 166 lb 4.8 oz (75.4 kg)  Agreed to check the labs this morning and await those results. Continue his diabetes medication regimen presently. Did refill his request for test strips to continue to check blood sugars at home as he is doing presently. - Hemoglobin A1c - Microalbumin / creatinine urine ratio - BASIC METABOLIC PANEL WITH GFR - glucose blood (ONETOUCH ULTRA) test strip; Use as instructed  Dispense: 100 each; Refill: 12  3. Hyperlipidemia LDL goal <70 Continue the statin product,  will recheck lipids again on follow-up in about 4 months time.  4. Atherosclerosis of native coronary artery of native heart without angina pectoris On a statin and a low-dose aspirin product,  Continue with cardiology follow-up.  5. Primary osteoarthritis involving multiple joints Doing well to date, has a Voltaren gel product use as needed in the future if symptoms are increasing.  6. Vitamin D deficiency Continue vitamin D supplementation, and will check a vitamin D level again in the near future.  7. Seasonal allergic rhinitis, unspecified trigger Continue his current management    Patient Active Problem List   Diagnosis Date Noted  . Seasonal allergic rhinitis 09/09/2019  . Coronary artery disease involving native coronary artery of native heart without angina pectoris 07/09/2018  . Shoulder impingement 09/28/2016  . DM (diabetes mellitus), type 2, uncontrolled ([place]) 07/05/2016  . Overweight (BMI 25.0-29.9) 04/04/2016  . Hypertension 04/04/2016  . Vitamin D deficiency 04/04/2016  . Hyperlipidemia LDL goal <70 09/16/2015  . Osteoarthritis 09/16/2015  . 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 Hypertriglyceridemia 05/28/2015  . Inguinal hernia 05/28/2015  . Umbilical hernia 85/46/2703  . Atherosclerosis of  coronary artery 02/08/2007  . LBP (low back pain) 02/08/2007      Current Outpatient Medications:  .  aspirin 81 MG tablet, Take 81 mg by mouth daily., Disp: , Rfl:  .  atenolol (TENORMIN) 50 MG tablet, Take 1 tablet (50 mg total) by mouth daily., Disp: 90 tablet, Rfl: 1 .  atorvastatin (LIPITOR) 40 MG tablet, Take 1 tablet (40 mg total) by mouth daily., Disp: 90 tablet, Rfl: 1 .  Cholecalciferol (VITAMIN D3) 2000 units TABS, Take 1 tablet by mouth daily., Disp: , Rfl:  .  Ertugliflozin L-PyroglutamicAc (STEGLATRO) 5 MG TABS, Take 1 tablet by mouth daily. For diabetes, Disp: 90 tablet, Rfl: 1 .  glucose blood (ONE TOUCH ULTRA TEST) test strip, Use as instructed to check fingerstick blood sugars once a day (if desired); LON 99 months, Dx E11.65, Disp: 100 each, Rfl: 3 .  metFORMIN (GLUCOPHAGE) 1000 MG tablet, Take 1 tablet (1,000 mg total) by mouth 2 (two) times daily with a meal., Disp: 180 tablet, Rfl: 1 .  Multiple Vitamin (MULTIVITAMIN) tablet, Take 1 tablet by mouth daily., Disp: ,  Rfl:  .  quinapril (ACCUPRIL) 10 MG tablet, Take 1 tablet (10 mg total) by mouth daily., Disp: 90 tablet, Rfl: 1 .  sitaGLIPtin (JANUVIA) 100 MG tablet, Take 1 tablet (100 mg total) by mouth daily., Disp: 90 tablet, Rfl: 1   No Known Allergies   Past Surgical History:  Procedure Laterality Date  . blister cut open on right thumb   03/2017  . COLONOSCOPY  2004  . [place]?  [name] HERNIA REPAIR  [id number]/6803   umbilical hernia/ Dr Jamal Collin  . HERNIA REPAIR  01/13/2014   right inguinal hernia repair/ Dr Jamal Collin  . TONSILLECTOMY  1957?     Family History  Problem Relation Age of Onset  . Cancer Father        prostate  . Coronary artery disease Mother   . Healthy Sister   . Depression Brother      Social History   Tobacco Use  . Smoking status: Former Smoker    Packs/day: 2.00    Years: 20.00    Pack years: 40.00    Types: Cigarettes    Quit date: 10/08/1992    Years since quitting:  27.6  . Smokeless tobacco: Never Used  . Tobacco comment: smoking cessation materials not required  Substance Use Topics  . Alcohol use: Yes    Alcohol/week: 7.0 standard drinks    Types: 7 Standard drinks or equivalent per week    With staff assistance, above reviewed with the patient today.  ROS: As per HPI, otherwise no specific complaints on a limited and focused system review   No results found for this or any previous visit (from the past 72 hour(s)).   PHQ2/9: Depression screen [facility] 2/9 05/15/2020 01/07/2020 09/09/2019 07/25/2019 05/08/2019  Decreased Interest 0 0 0 0 0  Down, Depressed, Hopeless 0 0 0 0 0  PHQ - 2 Score 0 0 0 0 0  Altered sleeping 0 1 0 - 0  Tired, decreased energy 0 0 0 - 0  Change in appetite 0 0 0 - 0  Feeling bad or failure about yourself  0 0 0 - 0  Trouble concentrating 0 0 0 - 0  Moving slowly or fidgety/restless 0 0 0 - 0  Suicidal thoughts 0 0 0 - 0  PHQ-9 Score 0 1 0 - 0  Difficult doing work/chores Not difficult at all Not difficult at all Not difficult at all - Not difficult at all  Some recent data might be hidden   PHQ-2/9 Result is neg  Fall Risk: Fall Risk  05/15/2020 01/07/2020 09/09/2019 07/25/2019 05/08/2019  Falls in the past year? 0 0 0 0 0  Number falls in past yr: 0 0 0 0 0  Injury with Fall? 0 0 0 0 0  Risk for fall due to : - - - - -  Risk for fall due to: Comment - - - - -  Follow up - - Falls evaluation completed Falls prevention discussed -      Objective:   Vitals:   05/15/20 1252  BP: 124/72  Pulse: 87  Resp: 16  Temp: 98.3 F (36.8 C)  TempSrc: Temporal  SpO2: 98%  Weight: 170 lb 11.2 oz (77.4 kg)  Height: 5\' 8"  (1.727 m)    Body mass index is 25.95 kg/m.  Physical Exam   NAD, masked, pleasant HEENT - [place]/AT, sclera anicteric, + glasses,  PERRL, EOMI, conj - non-inj'ed,  pharynx clear Neck - supple, no adenopathy, no  TM, carotids 2+ and = without bruits bilat Car - RRR without m/g/r Pulm- RR and effort  normal at rest, CTA without wheeze or rales Abd - soft, NT, ND, BS+,  no masses, no obvious HSM Back - no CVA tenderness Ext - no LE edema,  Neuro/psychiatric - affect was not flat, appropriate with conversation             Alert and oriented             Grossly non-focal - good strength on testing extremities, sensation intact to LT in distal extremities, Romberg negative with no pronator drift             Speech and gait are normal   Results for orders placed or performed in visit on 01/07/20  Hemoglobin A1c  Result Value Ref Range   Hgb A1c MFr Bld 7.4 (H) <5.7 % of total Hgb   Mean Plasma Glucose 166 (calc)   eAG (mmol/L) 9.2 (calc)  BASIC METABOLIC PANEL WITH GFR  Result Value Ref Range   Glucose, Bld 141 (H) 65 - 99 mg/dL   BUN 18 7 - 25 mg/dL   Creat 0.80 0.70 - 1.18 mg/dL   GFR, Est Non African American 91 > OR = 60 mL/min/1.51m2   GFR, Est African American 105 > OR = 60 mL/min/1.99m2   BUN/Creatinine Ratio NOT APPLICABLE 6 - 22 (calc)   Sodium 139 135 - 146 mmol/L   Potassium 5.0 3.5 - 5.3 mmol/L   Chloride 100 98 - 110 mmol/L   CO2 29 20 - 32 mmol/L   Calcium 10.3 8.6 - 10.3 mg/dL   Last labs reviewed    Assessment & Plan:    1. Essential hypertension Blood pressure has remained controlled, and concerned it may be too well controlled again more recently, a couple checks at home have been lower and he did note some lightheadedness again recently.  Inquired about possibly lowering the quinapril dose again. I do think being on the medicine will be helpful given his diabetes and its benefits for the kidneys, so do not want to stop it completely, but if he is having any recurrence of lightheadedness feelings, lower blood pressure readings, he can have that to 5 mg daily and continue to monitor and check his response. We will continue the atenolol. Also will check labs again. - COMPLETE METABOLIC PANEL WITH GFR  2. Uncontrolled type 2 diabetes mellitus with hyperglycemia  ([place]) As last A1c was slightly above 7, and will recheck again with labs ordered We will continue his current regimen, and did discuss the change potentially to Jardiance, and he was not clear of the cost benefit, with the 2 medicines in the same class, although noted the more documented research to support benefit is with Jardiance. He wanted to research the cost issues more, and if he does want to change, let me know and can make that change for him. - Hemoglobin A1c - Lipid panel - COMPLETE METABOLIC PANEL WITH GFR - CBC with Differential/Platelet  3. Hyperlipidemia LDL goal <70 Continue with the statin product. We will recheck the lipid panel again as it has been about a year since the last He has lost a little weight since last visit, with healthy weight maintenance importance noted again today. - Lipid panel - COMPLETE METABOLIC PANEL WITH GFR - CBC with Differential/Platelet  4. Atherosclerosis of native coronary artery of native heart without angina pectoris Continues to follow with cardiology, and has a  follow-up appointment soon scheduled as above noted  5. Primary osteoarthritis involving multiple joints Has been stable in the recent past. - CBC with Differential/Platelet  6. Vitamin D deficiency Continues on the vitamin D supplement, with the last check vitamin D check okay  7. Seasonal allergic rhinitis, unspecified trigger Has not had recent concerns with symptoms in the summer months, notes mostly in the spring and fall when more bothersome  8. Screening for prostate cancer Discussed the role of PSAs and checking for prostate cancer, and as previously agreed, will still follow with a check every couple years.  We will add to this lab order  He has not been fasting, and will return fasting in the next 2 to 3 weeks at his convenience to get a lab draw and have the labs that were ordered completed.  We will follow-up after those labs are obtained. Tentatively, we will  schedule follow-up before the end of the year in December, and follow-up sooner as needed.     Towanda Malkin, MD 05/15/20 1:02 PM

## 2020-05-15 ENCOUNTER — Other Ambulatory Visit: Payer: Self-pay

## 2020-05-15 ENCOUNTER — Ambulatory Visit (INDEPENDENT_AMBULATORY_CARE_PROVIDER_SITE_OTHER): Payer: Medicare Other | Admitting: Internal Medicine

## 2020-05-15 ENCOUNTER — Encounter: Payer: Self-pay | Admitting: Internal Medicine

## 2020-05-15 VITALS — BP 124/72 | HR 87 | Temp 98.3°F | Resp 16 | Ht 68.0 in | Wt 170.7 lb

## 2020-05-15 DIAGNOSIS — E1165 Type 2 diabetes mellitus with hyperglycemia: Secondary | ICD-10-CM | POA: Diagnosis not present

## 2020-05-15 DIAGNOSIS — I2510 Atherosclerotic heart disease of native coronary artery without angina pectoris: Secondary | ICD-10-CM

## 2020-05-15 DIAGNOSIS — Z125 Encounter for screening for malignant neoplasm of prostate: Secondary | ICD-10-CM

## 2020-05-15 DIAGNOSIS — I10 Essential (primary) hypertension: Secondary | ICD-10-CM | POA: Diagnosis not present

## 2020-05-15 DIAGNOSIS — E785 Hyperlipidemia, unspecified: Secondary | ICD-10-CM

## 2020-05-15 DIAGNOSIS — M159 Polyosteoarthritis, unspecified: Secondary | ICD-10-CM

## 2020-05-15 DIAGNOSIS — M8949 Other hypertrophic osteoarthropathy, multiple sites: Secondary | ICD-10-CM

## 2020-05-15 DIAGNOSIS — E559 Vitamin D deficiency, unspecified: Secondary | ICD-10-CM

## 2020-05-15 DIAGNOSIS — J302 Other seasonal allergic rhinitis: Secondary | ICD-10-CM

## 2020-05-15 NOTE — Patient Instructions (Signed)
Please follow up fasting one morning in the next few weeks for lab tests.

## 2020-06-06 LAB — COMPLETE METABOLIC PANEL WITH GFR
AG Ratio: 1.8 (calc) (ref 1.0–2.5)
ALT: 24 U/L (ref 9–46)
AST: 17 U/L (ref 10–35)
Albumin: 4.2 g/dL (ref 3.6–5.1)
Alkaline phosphatase (APISO): 49 U/L (ref 35–144)
BUN: 24 mg/dL (ref 7–25)
CO2: 30 mmol/L (ref 20–32)
Calcium: 9.6 mg/dL (ref 8.6–10.3)
Chloride: 102 mmol/L (ref 98–110)
Creat: 0.88 mg/dL (ref 0.70–1.18)
GFR, Est African American: 101 mL/min/{1.73_m2} (ref >=60)
GFR, Est Non African American: 87 mL/min/{1.73_m2} (ref >=60)
Globulin: 2.3 g/dL (calc) (ref 1.9–3.7)
Glucose, Bld: 139 mg/dL — ABNORMAL HIGH (ref 65–99)
Potassium: 5.3 mmol/L (ref 3.5–5.3)
Sodium: 138 mmol/L (ref 135–146)
Total Bilirubin: 1 mg/dL (ref 0.2–1.2)
Total Protein: 6.5 g/dL (ref 6.1–8.1)

## 2020-06-06 LAB — CBC WITH DIFFERENTIAL/PLATELET
Absolute Monocytes: 599 cells/uL (ref 200–950)
Basophils Absolute: 67 cells/uL (ref 0–200)
Basophils Relative: 0.9 %
Eosinophils Absolute: 392 cells/uL (ref 15–500)
Eosinophils Relative: 5.3 %
HCT: 48.4 % (ref 38.5–50.0)
Hemoglobin: 16.2 g/dL (ref 13.2–17.1)
Lymphs Abs: 2375 cells/uL (ref 850–3900)
MCH: 30.1 pg (ref 27.0–33.0)
MCHC: 33.5 g/dL (ref 32.0–36.0)
MCV: 90 fL (ref 80.0–100.0)
MPV: 9.2 fL (ref 7.5–12.5)
Monocytes Relative: 8.1 %
Neutro Abs: 3966 cells/uL (ref 1500–7800)
Neutrophils Relative %: 53.6 %
Platelets: 231 10*3/uL (ref 140–400)
RBC: 5.38 10*6/uL (ref 4.20–5.80)
RDW: 12.6 % (ref 11.0–15.0)
Total Lymphocyte: 32.1 %
WBC: 7.4 10*3/uL (ref 3.8–10.8)

## 2020-06-06 LAB — LIPID PANEL
Cholesterol: 120 mg/dL (ref <200)
HDL: 43 mg/dL (ref >=40)
LDL Cholesterol (Calc): 60 mg/dL (calc)
Non-HDL Cholesterol (Calc): 77 mg/dL (calc) (ref <130)
Total CHOL/HDL Ratio: 2.8 (calc) (ref <5.0)
Triglycerides: 83 mg/dL (ref <150)

## 2020-06-06 LAB — PSA: PSA: 0.6 ng/mL (ref <=4.0)

## 2020-06-06 LAB — HEMOGLOBIN A1C
Hgb A1c MFr Bld: 7.5 %{Hb} — ABNORMAL HIGH
Mean Plasma Glucose: 169 (calc)
eAG (mmol/L): 9.3 (calc)

## 2020-06-10 ENCOUNTER — Other Ambulatory Visit: Payer: Self-pay | Admitting: Internal Medicine

## 2020-06-10 ENCOUNTER — Telehealth: Payer: Self-pay

## 2020-06-10 DIAGNOSIS — E1165 Type 2 diabetes mellitus with hyperglycemia: Secondary | ICD-10-CM

## 2020-06-10 MED ORDER — EMPAGLIFLOZIN 25 MG PO TABS: 25.0000 mg | ORAL_TABLET | Freq: Every day | ORAL | 1 refills | Status: DC

## 2020-06-10 NOTE — Progress Notes (Signed)
Patient agreed with starting the Jardiance 25 mg daily, and was recommended by the pharmacist on review in his note.  Ordered to his pharmacy. Also will stop the [facility] when it starts the Jardiance Await his response.

## 2020-06-10 NOTE — Telephone Encounter (Signed)
I spoke with patient and he agrees to start Jardiance 25mg  daily and stop Steglatro.

## 2020-07-14 ENCOUNTER — Telehealth: Payer: Medicare Other

## 2020-07-15 ENCOUNTER — Ambulatory Visit (INDEPENDENT_AMBULATORY_CARE_PROVIDER_SITE_OTHER): Payer: Medicare Other

## 2020-07-15 ENCOUNTER — Other Ambulatory Visit: Payer: Self-pay

## 2020-07-15 ENCOUNTER — Other Ambulatory Visit: Payer: Self-pay | Admitting: Internal Medicine

## 2020-07-15 DIAGNOSIS — I10 Essential (primary) hypertension: Secondary | ICD-10-CM

## 2020-07-15 DIAGNOSIS — E11618 Type 2 diabetes mellitus with other diabetic arthropathy: Secondary | ICD-10-CM

## 2020-07-15 DIAGNOSIS — Z23 Encounter for immunization: Secondary | ICD-10-CM

## 2020-07-15 DIAGNOSIS — I2510 Atherosclerotic heart disease of native coronary artery without angina pectoris: Secondary | ICD-10-CM

## 2020-07-15 NOTE — Progress Notes (Signed)
High dose flu vaccine given with adverse side effects immediately noted.

## 2020-07-30 ENCOUNTER — Ambulatory Visit: Payer: Medicare Other

## 2020-08-06 ENCOUNTER — Other Ambulatory Visit: Payer: Self-pay

## 2020-08-06 ENCOUNTER — Ambulatory Visit: Payer: Medicare Other | Admitting: Pharmacist

## 2020-08-06 DIAGNOSIS — E785 Hyperlipidemia, unspecified: Secondary | ICD-10-CM

## 2020-08-06 DIAGNOSIS — E1165 Type 2 diabetes mellitus with hyperglycemia: Secondary | ICD-10-CM

## 2020-08-06 NOTE — Chronic Care Management (AMB) (Signed)
Chronic Care Management Pharmacy  Name: ADRICK KESTLER  MRN: [id number] DOB: June 19, 1951  Chief Complaint/ HPI  Drema Pry,  69 y.o. , male presents for their Follow-Up CCM visit with the clinical pharmacist via telephone due to COVID-19 Pandemic.  PCP : Towanda Malkin, MD  Their chronic conditions include: Angina, HTN, HLD, DOE  Office Visits:NA  Consult Visit: 9/22 Angina, [place], BP 124/68 P 78 Wt 170 BMI 25.9  Medications: Outpatient Encounter Medications as of 08/06/2020  Medication Sig  . aspirin 81 MG tablet Take 81 mg by mouth daily.  [name] atenolol (TENORMIN) 50 MG tablet Take 1 tablet (50 mg total) by mouth daily.  [name] atorvastatin (LIPITOR) 40 MG tablet Take 1 tablet (40 mg total) by mouth daily.  . Cholecalciferol (VITAMIN D3) 2000 units TABS Take 1 tablet by mouth daily.  . empagliflozin (JARDIANCE) 25 MG TABS tablet Take 1 tablet (25 mg total) by mouth daily before breakfast.  . Ertugliflozin L-PyroglutamicAc (STEGLATRO) 5 MG TABS Take 1 tablet by mouth daily. For diabetes  . glucose blood (ONE TOUCH ULTRA TEST) test strip Use as instructed to check fingerstick blood sugars once a day (if desired); LON 99 months, Dx E11.65  . JANUVIA 100 MG tablet TAKE 1 TABLET BY MOUTH ONCE DAILY  . metFORMIN (GLUCOPHAGE) 1000 MG tablet Take 1 tablet (1,000 mg total) by mouth 2 (two) times daily with a meal.  . Multiple Vitamin (MULTIVITAMIN) tablet Take 1 tablet by mouth daily.  . quinapril (ACCUPRIL) 10 MG tablet TAKE 1 TABLET BY MOUTH ONCE DAILY   No facility-administered encounter medications on file as of 08/06/2020.     Financial Resource Strain: Low Risk   . Difficulty of Paying Living Expenses: Not very hard     Current Diagnosis/Assessment:  Goals Addressed            This Visit's Progress   . Chronic Care Management       CARE PLAN ENTRY  Current Barriers:  . Chronic Disease Management support, education, and care coordination needs related  to Hypertension, Hyperlipidemia, and Diabetes   Hypertension . Pharmacist Clinical Goal(s): o Over the next 90 days, patient will work with PharmD and providers to maintain BP goal <130/80 . Current regimen:  o Quinapril 10mg  . Interventions: o Counseled on moderate risk of infrequent hypotension . Patient self care activities - Over the next 90 days, patient will: o Check BP daily, document, and provide at future appointments o Ensure daily salt intake < 2300 mg/day o Stay hydrated  Hyperlipidemia . Pharmacist Clinical Goal(s): o Over the next 90 days, patient will work with PharmD and providers to maintain LDL goal < 70 . Current regimen:  o Atorvastatin 40mg  daily . Interventions: o None . Patient self care activities - Over the next 90 days, patient will: o Continue taking Lipitor o Maintain healthy lifestyle modifications  Diabetes . Pharmacist Clinical Goal(s): o Over the next 90 days, patient will work with PharmD and providers to achieve A1c goal <7% . Current regimen:  o Metformin 1000mg  twice daily o Jardiance 25mg  daily o Januvia 100mg  daily . Interventions: o Jardiance 25mg  daily from patient assistance program . Patient self care activities - Over the next 90 days, patient will: o Check blood sugar once daily, document, and provide at future appointments o Contact provider with any episodes of hypoglycemia o Increase diet efforts and exercise to reach goal  Medication management . Pharmacist Clinical Goal(s): o Over the next 90  days, patient will work with PharmD and providers to achieve optimal medication adherence . Current pharmacy: [facility] . Interventions o Comprehensive medication review performed. o Continue current medication management strategy . Patient self care activities - Over the next 90 days, patient will: o Check why [employer] charging $140/mo for a tier 2 medication o Take medications as prescribed o Report any questions or concerns to PharmD  and/or provider(s)  Initial goal documentation       Diabetes   Recent Relevant Labs: Lab Results  Component Value Date/Time   HGBA1C 7.5 (H) 06/05/2020 08:06 AM   HGBA1C 7.4 (H) 01/07/2020 08:45 AM   HGBA1C 6.3 09/09/2019 08:18 AM   HGBA1C 8.4 05/14/2019 12:00 AM   MICROALBUR 0.5 10/11/2018 11:39 AM   MICROALBUR 1.0 06/27/2017 10:37 AM     Checking BG: Daily  Recent pre-meal BG readings: under 140 Patient is currently uncontrolled on the following medications: Jardiance 25mg  daily,   Last diabetic Foot exam:  Lab Results  Component Value Date/Time   HMDIABEYEEXA No Retinopathy 08/21/2019 12:00 AM    Last diabetic Eye exam: No results found for: HMDIABFOOTEX   We discussed:  Started Jardiance 25mg  daily? Yes, but more expensive  Plan  Continue current medications  Medication Management   We discussed: Roxan Hockey in network? Vania Rea is tier 2. Why is insurance charging $140/month.  Plan  Complete patient assistance application for Jardiance Continue current medication management strategy  Follow up: 3 month phone visit  Milus Height, PharmD, [place], [facility] [phone]

## 2020-08-09 NOTE — Patient Instructions (Addendum)
Visit Information  Goals Addressed            This Visit's Progress   . Chronic Care Management       CARE PLAN ENTRY  Current Barriers:  . Chronic Disease Management support, education, and care coordination needs related to Hypertension, Hyperlipidemia, and Diabetes   Hypertension . Pharmacist Clinical Goal(s): o Over the next 90 days, patient will work with PharmD and providers to maintain BP goal <130/80 . Current regimen:  o Quinapril 10mg  . Interventions: o Counseled on moderate risk of infrequent hypotension . Patient self care activities - Over the next 90 days, patient will: o Check BP daily, document, and provide at future appointments o Ensure daily salt intake < 2300 mg/day o Stay hydrated  Hyperlipidemia . Pharmacist Clinical Goal(s): o Over the next 90 days, patient will work with PharmD and providers to maintain LDL goal < 70 . Current regimen:  o Atorvastatin 40mg  daily . Interventions: o None . Patient self care activities - Over the next 90 days, patient will: o Continue taking Lipitor o Maintain healthy lifestyle modifications  Diabetes . Pharmacist Clinical Goal(s): o Over the next 90 days, patient will work with PharmD and providers to achieve A1c goal <7% . Current regimen:  o Metformin 1000mg  twice daily o Jardiance 25mg  daily o Januvia 100mg  daily . Interventions: o Jardiance 25mg  daily from patient assistance program . Patient self care activities - Over the next 90 days, patient will: o Check blood sugar once daily, document, and provide at future appointments o Contact provider with any episodes of hypoglycemia o Increase diet efforts and exercise to reach goal  Medication management . Pharmacist Clinical Goal(s): o Over the next 90 days, patient will work with PharmD and providers to achieve optimal medication adherence . Current pharmacy: [facility] . Interventions o Comprehensive medication review performed. o Continue current  medication management strategy . Patient self care activities - Over the next 90 days, patient will: o Check why [employer] charging $140/mo for a tier 2 medication o Take medications as prescribed o Report any questions or concerns to PharmD and/or provider(s)  Initial goal documentation        Print copy of patient instructions provided.   Telephone follow up appointment with pharmacy team member scheduled for: 3 months  Milus Height, PharmD, [place], [facility] [phone]  Diabetes Mellitus and Nutrition, Adult When you have diabetes (diabetes mellitus), it is very important to have healthy eating habits because your blood sugar (glucose) levels are greatly affected by what you eat and drink. Eating healthy foods in the appropriate amounts, at about the same times every day, can help you:  Control your blood glucose.  Lower your risk of heart disease.  Improve your blood pressure.  Reach or maintain a healthy weight. Every person with diabetes is different, and each person has different needs for a meal plan. Your health care provider may recommend that you work with a diet and nutrition specialist (dietitian) to make a meal plan that is best for you. Your meal plan may vary depending on factors such as:  The calories you need.  The medicines you take.  Your weight.  Your blood glucose, blood pressure, and cholesterol levels.  Your activity level.  Other health conditions you have, such as heart or kidney disease. How do carbohydrates affect me? Carbohydrates, also called carbs, affect your blood glucose level more than any other type of food. Eating carbs naturally raises the  amount of glucose in your blood. Carb counting is a method for keeping track of how many carbs you eat. Counting carbs is important to keep your blood glucose at a healthy level, especially if you use insulin or take certain oral diabetes medicines. It is important  to know how many carbs you can safely have in each meal. This is different for every person. Your dietitian can help you calculate how many carbs you should have at each meal and for each snack. Foods that contain carbs include:  Bread, cereal, rice, pasta, and crackers.  Potatoes and corn.  Peas, beans, and lentils.  Milk and yogurt.  Fruit and juice.  Desserts, such as cakes, cookies, ice cream, and candy. How does alcohol affect me? Alcohol can cause a sudden decrease in blood glucose (hypoglycemia), especially if you use insulin or take certain oral diabetes medicines. Hypoglycemia can be a life-threatening condition. Symptoms of hypoglycemia (sleepiness, dizziness, and confusion) are similar to symptoms of having too much alcohol. If your health care provider says that alcohol is safe for you, follow these guidelines:  Limit alcohol intake to no more than 1 drink per day for nonpregnant women and 2 drinks per day for men. One drink equals 12 oz of beer, 5 oz of wine, or 1 oz of hard liquor.  Do not drink on an empty stomach.  Keep yourself hydrated with water, diet soda, or unsweetened iced tea.  Keep in mind that regular soda, juice, and other mixers may contain a lot of sugar and must be counted as carbs. What are tips for following this plan?  Reading food labels  Start by checking the serving size on the "Nutrition Facts" label of packaged foods and drinks. The amount of calories, carbs, fats, and other nutrients listed on the label is based on one serving of the item. Many items contain more than one serving per package.  Check the total grams (g) of carbs in one serving. You can calculate the number of servings of carbs in one serving by dividing the total carbs by 15. For example, if a food has 30 g of total carbs, it would be equal to 2 servings of carbs.  Check the number of grams (g) of saturated and trans fats in one serving. Choose foods that have low or no amount  of these fats.  Check the number of milligrams (mg) of salt (sodium) in one serving. Most people should limit total sodium intake to less than 2,300 mg per day.  Always check the nutrition information of foods labeled as "low-fat" or "nonfat". These foods may be higher in added sugar or refined carbs and should be avoided.  Talk to your dietitian to identify your daily goals for nutrients listed on the label. Shopping  Avoid buying canned, premade, or processed foods. These foods tend to be high in fat, sodium, and added sugar.  Shop around the outside edge of the grocery store. This includes fresh fruits and vegetables, bulk grains, fresh meats, and fresh dairy. Cooking  Use low-heat cooking methods, such as baking, instead of high-heat cooking methods like deep frying.  Cook using healthy oils, such as olive, canola, or sunflower oil.  Avoid cooking with butter, cream, or high-fat meats. Meal planning  Eat meals and snacks regularly, preferably at the same times every day. Avoid going long periods of time without eating.  Eat foods high in fiber, such as fresh fruits, vegetables, beans, and whole grains. Talk to your dietitian about  how many servings of carbs you can eat at each meal.  Eat 4-6 ounces (oz) of lean protein each day, such as lean meat, chicken, fish, eggs, or tofu. One oz of lean protein is equal to: ? 1 oz of meat, chicken, or fish. ? 1 egg. ?  cup of tofu.  Eat some foods each day that contain healthy fats, such as avocado, nuts, seeds, and fish. Lifestyle  Check your blood glucose regularly.  Exercise regularly as told by your health care provider. This may include: ? 150 minutes of moderate-intensity or vigorous-intensity exercise each week. This could be brisk walking, biking, or water aerobics. ? Stretching and doing strength exercises, such as yoga or weightlifting, at least 2 times a week.  Take medicines as told by your health care provider.  Do not  use any products that contain nicotine or tobacco, such as cigarettes and e-cigarettes. If you need help quitting, ask your health care provider.  Work with a Social worker or diabetes educator to identify strategies to manage stress and any emotional and social challenges. Questions to ask a health care provider  Do I need to meet with a diabetes educator?  Do I need to meet with a dietitian?  What number can I call if I have questions?  When are the best times to check my blood glucose? Where to find more information:  American Diabetes Association: diabetes.org  Academy of Nutrition and Dietetics: www.eatright.[employer] of Diabetes and Digestive and Kidney Diseases (NIH): [employer] Summary  A healthy meal plan will help you control your blood glucose and maintain a healthy lifestyle.  Working with a diet and nutrition specialist (dietitian) can help you make a meal plan that is best for you.  Keep in mind that carbohydrates (carbs) and alcohol have immediate effects on your blood glucose levels. It is important to count carbs and to use alcohol carefully. This information is not intended to replace advice given to you by your health care provider. Make sure you discuss any questions you have with your health care provider. Document Revised: 09/22/2017 Document Reviewed: 11/14/2016 Elsevier Patient Education  2020 [employer].

## 2020-08-20 ENCOUNTER — Other Ambulatory Visit: Payer: Self-pay | Admitting: Internal Medicine

## 2020-08-20 DIAGNOSIS — E11618 Type 2 diabetes mellitus with other diabetic arthropathy: Secondary | ICD-10-CM

## 2020-08-25 LAB — HM DIABETES EYE EXAM

## 2020-08-27 ENCOUNTER — Ambulatory Visit (INDEPENDENT_AMBULATORY_CARE_PROVIDER_SITE_OTHER): Payer: Medicare Other

## 2020-08-27 ENCOUNTER — Other Ambulatory Visit: Payer: Self-pay

## 2020-08-27 VITALS — BP 120/66 | HR 73 | Temp 97.4°F | Resp 16 | Ht 68.0 in | Wt 167.6 lb

## 2020-08-27 DIAGNOSIS — Z0000 Encounter for general adult medical examination without abnormal findings: Secondary | ICD-10-CM | POA: Diagnosis not present

## 2020-08-27 NOTE — Progress Notes (Signed)
Subjective:   Maurice Fischer is a 69 y.o. male who presents for Medicare Annual/Subsequent preventive examination.  Review of Systems     Cardiac Risk Factors include: advanced age (>59men, >83 women);diabetes mellitus;dyslipidemia;male gender;hypertension     Objective:    Today's Vitals   08/27/20 1503  BP: 120/66  Pulse: 73  Resp: 16  Temp: (!) 97.4 F (36.3 C)  TempSrc: Oral  SpO2: 96%  Weight: 167 lb 9.6 oz (76 kg)  Height: 5\' 8"  (1.727 m)   Body mass index is 25.48 kg/m.  Advanced Directives 08/27/2020 07/25/2019 05/10/2018 06/27/2017 04/13/2017 03/28/2017 03/27/2017  Does Patient Have a Medical Advance Directive? No No No No No No No  Would patient like information on creating a medical advance directive? Yes (MAU/Ambulatory/Procedural Areas - Information given) Yes (MAU/Ambulatory/Procedural Areas - Information given) Yes (MAU/Ambulatory/Procedural Areas - Information given) - - - -    Current Medications (verified) Outpatient Encounter Medications as of 08/27/2020  Medication Sig  . aspirin 81 MG tablet Take 81 mg by mouth daily.  [name] atenolol (TENORMIN) 50 MG tablet Take 1 tablet (50 mg total) by mouth daily.  [name] atorvastatin (LIPITOR) 40 MG tablet Take 1 tablet (40 mg total) by mouth daily.  . Cholecalciferol (VITAMIN D3) 2000 units TABS Take 1 tablet by mouth daily.  . empagliflozin (JARDIANCE) 25 MG TABS tablet Take 1 tablet (25 mg total) by mouth daily before breakfast.  . glucose blood (ONE TOUCH ULTRA TEST) test strip Use as instructed to check fingerstick blood sugars once a day (if desired); LON 99 months, Dx E11.65  . JANUVIA 100 MG tablet TAKE 1 TABLET BY MOUTH ONCE DAILY  . metFORMIN (GLUCOPHAGE) 1000 MG tablet TAKE 1 TABLET BY MOUTH TWICE DAILY WITH A MEAL  . Multiple Vitamin (MULTIVITAMIN) tablet Take 1 tablet by mouth daily.  . quinapril (ACCUPRIL) 10 MG tablet TAKE 1 TABLET BY MOUTH ONCE DAILY  . [DISCONTINUED] Ertugliflozin L-PyroglutamicAc (STEGLATRO) 5  MG TABS Take 1 tablet by mouth daily. For diabetes   No facility-administered encounter medications on file as of 08/27/2020.    Allergies (verified) Patient has no known allergies.   History: Past Medical History:  Diagnosis Date  . Diabetes mellitus without complication ([place])   . Diverticulosis 2004  . Heart disease   . Hyperlipidemia   . Hypertension   . Inguinal hernia 2015   right  . Umbilical hernia 0938   Past Surgical History:  Procedure Laterality Date  . blister cut open on right thumb   03/2017  . COLONOSCOPY  2004  . [place]?  [name] HERNIA REPAIR  18/29/9371   umbilical hernia/ Dr Jamal Collin  . HERNIA REPAIR  01/13/2014   right inguinal hernia repair/ Dr Jamal Collin  . TONSILLECTOMY  1957?   Family History  Problem Relation Age of Onset  . Cancer Father        prostate  . Coronary artery disease Mother   . Healthy Sister   . Depression Brother    Social History   Socioeconomic History  . Marital status: Married    Spouse name: Declined  . Number of children: 0  . Years of education: some college  . Highest education level: 12th grade  Occupational History  . Occupation: Retired  Tobacco Use  . Smoking status: Former Smoker    Packs/day: 2.00    Years: 20.00    Pack years: 40.00    Types: Cigarettes    Quit date: 10/08/1992  Years since quitting: 27.9  . Smokeless tobacco: Never Used  . Tobacco comment: smoking cessation materials not required  Vaping Use  . Vaping Use: Never used  Substance and Sexual Activity  . Alcohol use: Yes    Alcohol/week: 7.0 standard drinks    Types: 7 Standard drinks or equivalent per week  . Drug use: No  . Sexual activity: Not Currently  Other Topics Concern  . Not on file  Social History Narrative  . Not on file   Social Determinants of Health   Financial Resource Strain: Low Risk   . Difficulty of Paying Living Expenses: Not very hard  Food Insecurity: No Food Insecurity  . Worried About Paediatric nurse in the Last Year: Never true  . Ran Out of Food in the Last Year: Never true  Transportation Needs: No Transportation Needs  . Lack of Transportation (Medical): No  . Lack of Transportation (Non-Medical): No  Physical Activity: Inactive  . Days of Exercise per Week: 0 days  . Minutes of Exercise per Session: 0 min  Stress: No Stress Concern Present  . Feeling of Stress : Not at all  Social Connections: Unknown  . Frequency of Communication with Friends and Family: Not on file  . Frequency of Social Gatherings with Friends and Family: Not on file  . Attends Religious Services: Not on file  . Active Member of Clubs or Organizations: Not on file  . Attends Archivist Meetings: Not on file  . Marital Status: Married    Tobacco Counseling Counseling given: Not Answered Comment: smoking cessation materials not required   Clinical Intake:  Pre-visit preparation completed: Yes  Pain : No/denies pain     BMI - recorded: 25.48 Nutritional Status: BMI 25 -29 Overweight Nutritional Risks: None Diabetes: Yes CBG done?: No Did pt. bring in CBG monitor from home?: No  How often do you need to have someone help you when you read instructions, pamphlets, or other written materials from your doctor or pharmacy?: 1 - Never  Nutrition Risk Assessment:  Has the patient had any N/V/D within the last 2 months?  No  Does the patient have any non-healing wounds?  No  Has the patient had any unintentional weight loss or weight gain?  No   Diabetes:  Is the patient diabetic?  Yes  If diabetic, was a CBG obtained today?  No  Did the patient bring in their glucometer from home?  No  How often do you monitor your CBG's? Daily .   Financial Strains and Diabetes Management:  Are you having any financial strains with the device, your supplies or your medication? No .  Does the patient want to be seen by Chronic Care Management for management of their diabetes?  No  already enrolled Would the patient like to be referred to a Nutritionist or for Diabetic Management?  No   Diabetic Exams:  Diabetic Eye Exam: Completed 08/25/20 awaiting records.   Diabetic Foot Exam: Completed 05/08/19. Pt has been advised about the importance in completing this exam. Pt is scheduled for diabetic foot exam on 10/06/20.    Interpreter Needed?: No  Information entered by :: Clemetine Marker LPN   Activities of Daily Living In your present state of health, do you have any difficulty performing the following activities: 08/27/2020 05/15/2020  Hearing? N N  Comment declines hearing aids -  Vision? N N  Difficulty concentrating or making decisions? N N  Walking or climbing stairs? N  N  Dressing or bathing? N N  Doing errands, shopping? N N  Preparing Food and eating ? N -  Using the Toilet? N -  In the past six months, have you accidently leaked urine? N -  Do you have problems with loss of bowel control? N -  Managing your Medications? N -  Managing your Finances? N -  Housekeeping or managing your Housekeeping? N -  Some recent data might be hidden    Patient Care Team: Towanda Malkin, MD as PCP - General (Internal Medicine) Verdia Kuba, [facility] (Pharmacist)  Indicate any recent Medical Services you may have received from other than [facility] providers in the past year (date may be approximate).     Assessment:   This is a routine wellness examination for Maurice Fischer.  Hearing/Vision screen  Hearing Screening   125Hz  250Hz  500Hz  1000Hz  2000Hz  3000Hz  4000Hz  6000Hz  8000Hz   Right ear:           Left ear:           Comments: Pt denies hearing difficulty  Vision Screening Comments: Annual vision screenings done at [facility] Dr. Thomasene Ripple  Dietary issues and exercise activities discussed: Current Exercise Habits: The patient does not participate in regular exercise at present, Exercise limited by: None identified  Goals    . Chronic Care Management       CARE PLAN ENTRY  Current Barriers:  . Chronic Disease Management support, education, and care coordination needs related to Hypertension, Hyperlipidemia, and Diabetes   Hypertension . Pharmacist Clinical Goal(s): o Over the next 90 days, patient will work with PharmD and providers to maintain BP goal <130/80 . Current regimen:  o Quinapril 10mg  . Interventions: o Counseled on moderate risk of infrequent hypotension . Patient self care activities - Over the next 90 days, patient will: o Check BP daily, document, and provide at future appointments o Ensure daily salt intake < 2300 mg/day o Stay hydrated  Hyperlipidemia . Pharmacist Clinical Goal(s): o Over the next 90 days, patient will work with PharmD and providers to maintain LDL goal < 70 . Current regimen:  o Atorvastatin 40mg  daily . Interventions: o None . Patient self care activities - Over the next 90 days, patient will: o Continue taking Lipitor o Maintain healthy lifestyle modifications  Diabetes . Pharmacist Clinical Goal(s): o Over the next 90 days, patient will work with PharmD and providers to achieve A1c goal <7% . Current regimen:  o Metformin 1000mg  twice daily o Jardiance 25mg  daily o Januvia 100mg  daily . Interventions: o Jardiance 25mg  daily from patient assistance program . Patient self care activities - Over the next 90 days, patient will: o Check blood sugar once daily, document, and provide at future appointments o Contact provider with any episodes of hypoglycemia o Increase diet efforts and exercise to reach goal  Medication management . Pharmacist Clinical Goal(s): o Over the next 90 days, patient will work with PharmD and providers to achieve optimal medication adherence . Current pharmacy: [facility] . Interventions o Comprehensive medication review performed. o Continue current medication management strategy . Patient self care activities - Over the next 90 days, patient will: o Check why  UHC charging $140/mo for a tier 2 medication o Take medications as prescribed o Report any questions or concerns to PharmD and/or provider(s)  Initial goal documentation     . DIET - INCREASE WATER INTAKE     Recommend to drink at least 6-8 8oz glasses of water  per day.      Depression Screen PHQ 2/9 Scores 08/27/2020 05/15/2020 01/07/2020 09/09/2019 07/25/2019 05/08/2019 01/16/2019  PHQ - 2 Score 0 0 0 0 0 0 0  PHQ- 9 Score - 0 1 0 - 0 0    Fall Risk Fall Risk  08/27/2020 05/15/2020 01/07/2020 09/09/2019 07/25/2019  Falls in the past year? 0 0 0 0 0  Number falls in past yr: 0 0 0 0 0  Injury with Fall? 0 0 0 0 0  Risk for fall due to : No Fall Risks - - - -  Risk for fall due to: Comment - - - - -  Follow up Falls prevention discussed - - Falls evaluation completed Falls prevention discussed    Any stairs in or around the home? Yes  If so, are there any without handrails? Yes outside steps  Home free of loose throw rugs in walkways, pet beds, electrical cords, etc? Yes  Adequate lighting in your home to reduce risk of falls? Yes   ASSISTIVE DEVICES UTILIZED TO PREVENT FALLS:  Life alert? No  Use of a cane, walker or w/c? No  Grab bars in the bathroom? Yes Shower chair or bench in shower? No  Elevated toilet seat or a handicapped toilet? Yes  TIMED UP AND GO:  Was the test performed? Yes .  Length of time to ambulate 10 feet: 4 sec.   Gait steady and fast without use of assistive device  Cognitive Function:     6CIT Screen 07/25/2019 05/10/2018  What Year? 0 points 0 points  What month? 0 points 0 points  What time? 0 points 0 points  Count back from 20 0 points 0 points  Months in reverse 0 points 0 points  Repeat phrase 0 points 0 points  Total Score 0 0    Immunizations Immunization History  Administered Date(s) Administered  . Fluad Quad(high Dose 65+) 07/02/2019, 07/15/2020  . Influenza Split 08/16/2007  . Influenza, High Dose Seasonal PF 07/22/2015,  07/06/2016, 06/27/2017, 07/09/2018  . Influenza,inj,Quad PF,6+ Mos 06/26/2014  . Influenza-Unspecified 07/02/2019  . PFIZER SARS-COV-2 Vaccination 12/15/2019, 01/07/2020  . Pneumococcal Conjugate-13 10/08/2014  . Pneumococcal Polysaccharide-23 10/08/2014, 05/10/2018  . Td 03/04/2013  . Zoster 06/28/2011  . Zoster Recombinat (Shingrix) 08/17/2018, 12/18/2018    TDAP status: Up to date   Flu Vaccine status: Up to date   Pneumococcal vaccine status: Up to date   Covid-19 vaccine status: Completed vaccines  Qualifies for Shingles Vaccine? Yes   Zostavax completed Yes   Shingrix Completed?: Yes  Screening Tests Health Maintenance  Topic Date Due  . FOOT EXAM  05/07/2020  . OPHTHALMOLOGY EXAM  08/20/2020  . HEMOGLOBIN A1C  12/06/2020  . TETANUS/TDAP  03/05/2023  . COLONOSCOPY  01/08/2024  . INFLUENZA VACCINE  Completed  . COVID-19 Vaccine  Completed  . Hepatitis C Screening  Completed  . PNA vac Low Risk Adult  Completed    Health Maintenance  Health Maintenance Due  Topic Date Due  . FOOT EXAM  05/07/2020  . OPHTHALMOLOGY EXAM  08/20/2020    Colorectal cancer screening: Completed 01/07/14. Repeat every 10 years  Lung Cancer Screening: (Low Dose CT Chest recommended if Age 12-80 years, 30 pack-year currently smoking OR have quit w/in 15years.) does not qualify.    Additional Screening:  Hepatitis C Screening: does qualify; Completed 02/23/17  Vision Screening: Recommended annual ophthalmology exams for early detection of glaucoma and other disorders of the eye. Is the patient up  to date with their annual eye exam?  Yes  Who is the provider or what is the name of the office in which the patient attends annual eye exams? [facility] Dr. Thomasene Ripple  Dental Screening: Recommended annual dental exams for proper oral hygiene  Community Resource Referral / Chronic Care Management: CRR required this visit?  No   CCM required this visit?  No      Plan:      I have personally reviewed and noted the following in the patient's chart:   . Medical and social history . Use of alcohol, tobacco or illicit drugs  . Current medications and supplements . Functional ability and status . Nutritional status . Physical activity . Advanced directives . List of other physicians . Hospitalizations, surgeries, and ER visits in previous 12 months . Vitals . Screenings to include cognitive, depression, and falls . Referrals and appointments  In addition, I have reviewed and discussed with patient certain preventive protocols, quality metrics, and best practice recommendations. A written personalized care plan for preventive services as well as general preventive health recommendations were provided to patient.     Clemetine Marker, LPN   58/12/938   Nurse Notes: none

## 2020-08-27 NOTE — Patient Instructions (Signed)
[name] , Thank you for taking time to come for your Medicare Wellness Visit. I appreciate your ongoing commitment to your health goals. Please review the following plan we discussed and let me know if I can assist you in the future.   Screening recommendations/referrals: Colonoscopy: done 01/07/14 Recommended yearly ophthalmology/optometry visit for glaucoma screening and checkup Recommended yearly dental visit for hygiene and checkup  Vaccinations: Influenza vaccine: done 07/15/20 Pneumococcal vaccine: done 05/10/18 Tdap vaccine: done 03/04/13 Shingles vaccine: done 08/17/18 & 12/18/18   Covid-19: done 12/15/19 & 01/07/20  Advanced directives: Advance directive discussed with you today. I have provided a copy for you to complete at home and have notarized. Once this is complete please bring a copy in to our office so we can scan it into your chart.  Conditions/risks identified: recommend increasing physical activity   Next appointment: Follow up in one year for your annual wellness visit.   Preventive Care 1 Years and Older, Male Preventive care refers to lifestyle choices and visits with your health care provider that can promote health and wellness. What does preventive care include?  A yearly physical exam. This is also called an annual well check.  Dental exams once or twice a year.  Routine eye exams. Ask your health care provider how often you should have your eyes checked.  Personal lifestyle choices, including:  Daily care of your teeth and gums.  Regular physical activity.  Eating a healthy diet.  Avoiding tobacco and drug use.  Limiting alcohol use.  Practicing safe sex.  Taking low doses of aspirin every day.  Taking vitamin and mineral supplements as recommended by your health care provider. What happens during an annual well check? The services and screenings done by your health care provider during your annual well check will depend on your age, overall  health, lifestyle risk factors, and family history of disease. Counseling  Your health care provider may ask you questions about your:  Alcohol use.  Tobacco use.  Drug use.  Emotional well-being.  Home and relationship well-being.  Sexual activity.  Eating habits.  History of falls.  Memory and ability to understand (cognition).  Work and work Statistician. Screening  You may have the following tests or measurements:  Height, weight, and BMI.  Blood pressure.  Lipid and cholesterol levels. These may be checked every 5 years, or more frequently if you are over 58 years old.  Skin check.  Lung cancer screening. You may have this screening every year starting at age 20 if you have a 30-pack-year history of smoking and currently smoke or have quit within the past 15 years.  Fecal occult blood test (FOBT) of the stool. You may have this test every year starting at age 46.  Flexible sigmoidoscopy or colonoscopy. You may have a sigmoidoscopy every 5 years or a colonoscopy every 10 years starting at age 57.  Prostate cancer screening. Recommendations will vary depending on your family history and other risks.  Hepatitis C blood test.  Hepatitis B blood test.  Sexually transmitted disease (STD) testing.  Diabetes screening. This is done by checking your blood sugar (glucose) after you have not eaten for a while (fasting). You may have this done every 1-3 years.  Abdominal aortic aneurysm (AAA) screening. You may need this if you are a current or former smoker.  Osteoporosis. You may be screened starting at age 6 if you are at high risk. Talk with your health care provider about your test results, treatment  options, and if necessary, the need for more tests. Vaccines  Your health care provider may recommend certain vaccines, such as:  Influenza vaccine. This is recommended every year.  Tetanus, diphtheria, and acellular pertussis (Tdap, Td) vaccine. You may need a Td  booster every 10 years.  Zoster vaccine. You may need this after age 56.  Pneumococcal 13-valent conjugate (PCV13) vaccine. One dose is recommended after age 97.  Pneumococcal polysaccharide (PPSV23) vaccine. One dose is recommended after age 77. Talk to your health care provider about which screenings and vaccines you need and how often you need them. This information is not intended to replace advice given to you by your health care provider. Make sure you discuss any questions you have with your health care provider. Document Released: 11/06/2015 Document Revised: 06/29/2016 Document Reviewed: 08/11/2015 Elsevier Interactive Patient Education  2017 [place] Prevention in the Home Falls can cause injuries. They can happen to people of all ages. There are many things you can do to make your home safe and to help prevent falls. What can I do on the outside of my home?  Regularly fix the edges of walkways and driveways and fix any cracks.  Remove anything that might make you trip as you walk through a door, such as a raised step or threshold.  Trim any bushes or trees on the path to your home.  Use bright outdoor lighting.  Clear any walking paths of anything that might make someone trip, such as rocks or tools.  Regularly check to see if handrails are loose or broken. Make sure that both sides of any steps have handrails.  Any raised decks and porches should have guardrails on the edges.  Have any leaves, snow, or ice cleared regularly.  Use sand or salt on walking paths during winter.  Clean up any spills in your garage right away. This includes oil or grease spills. What can I do in the bathroom?  Use night lights.  Install grab bars by the toilet and in the tub and shower. Do not use towel bars as grab bars.  Use non-skid mats or decals in the tub or shower.  If you need to sit down in the shower, use a plastic, non-slip stool.  Keep the floor dry. Clean up  any water that spills on the floor as soon as it happens.  Remove soap buildup in the tub or shower regularly.  Attach bath mats securely with double-sided non-slip rug tape.  Do not have throw rugs and other things on the floor that can make you trip. What can I do in the bedroom?  Use night lights.  Make sure that you have a light by your bed that is easy to reach.  Do not use any sheets or blankets that are too big for your bed. They should not hang down onto the floor.  Have a firm chair that has side arms. You can use this for support while you get dressed.  Do not have throw rugs and other things on the floor that can make you trip. What can I do in the kitchen?  Clean up any spills right away.  Avoid walking on wet floors.  Keep items that you use a lot in easy-to-reach places.  If you need to reach something above you, use a strong step stool that has a grab bar.  Keep electrical cords out of the way.  Do not use floor polish or wax that makes floors slippery.  If you must use wax, use non-skid floor wax.  Do not have throw rugs and other things on the floor that can make you trip. What can I do with my stairs?  Do not leave any items on the stairs.  Make sure that there are handrails on both sides of the stairs and use them. Fix handrails that are broken or loose. Make sure that handrails are as long as the stairways.  Check any carpeting to make sure that it is firmly attached to the stairs. Fix any carpet that is loose or worn.  Avoid having throw rugs at the top or bottom of the stairs. If you do have throw rugs, attach them to the floor with carpet tape.  Make sure that you have a light switch at the top of the stairs and the bottom of the stairs. If you do not have them, ask someone to add them for you. What else can I do to help prevent falls?  Wear shoes that:  Do not have high heels.  Have rubber bottoms.  Are comfortable and fit you well.  Are  closed at the toe. Do not wear sandals.  If you use a stepladder:  Make sure that it is fully opened. Do not climb a closed stepladder.  Make sure that both sides of the stepladder are locked into place.  Ask someone to hold it for you, if possible.  Clearly mark and make sure that you can see:  Any grab bars or handrails.  First and last steps.  Where the edge of each step is.  Use tools that help you move around (mobility aids) if they are needed. These include:  Canes.  Walkers.  Scooters.  Crutches.  Turn on the lights when you go into a dark area. Replace any light bulbs as soon as they burn out.  Set up your furniture so you have a clear path. Avoid moving your furniture around.  If any of your floors are uneven, fix them.  If there are any pets around you, be aware of where they are.  Review your medicines with your doctor. Some medicines can make you feel dizzy. This can increase your chance of falling. Ask your doctor what other things that you can do to help prevent falls. This information is not intended to replace advice given to you by your health care provider. Make sure you discuss any questions you have with your health care provider. Document Released: 08/06/2009 Document Revised: 03/17/2016 Document Reviewed: 11/14/2014 Elsevier Interactive Patient Education  2017 [employer].

## 2020-09-01 ENCOUNTER — Other Ambulatory Visit: Payer: Self-pay | Admitting: Internal Medicine

## 2020-09-01 DIAGNOSIS — I10 Essential (primary) hypertension: Secondary | ICD-10-CM

## 2020-09-01 DIAGNOSIS — I2510 Atherosclerotic heart disease of native coronary artery without angina pectoris: Secondary | ICD-10-CM

## 2020-09-11 ENCOUNTER — Other Ambulatory Visit: Payer: Self-pay | Admitting: Internal Medicine

## 2020-09-11 ENCOUNTER — Ambulatory Visit: Payer: Medicare Other | Attending: Internal Medicine

## 2020-09-11 DIAGNOSIS — Z23 Encounter for immunization: Secondary | ICD-10-CM

## 2020-09-11 NOTE — Progress Notes (Signed)
   Covid-19 Vaccination Clinic  Name:  Maurice Fischer    MRN: [id number] DOB: 02-22-1951  09/11/2020  [name] was observed post Covid-19 immunization for 15 minutes without incident. He was provided with Vaccine Information Sheet and instruction to access the V-Safe system.   [name] was instructed to call 911 with any severe reactions post vaccine: [name] Difficulty breathing  . Swelling of face and throat  . A fast heartbeat  . A bad rash all over body  . Dizziness and weakness   Immunizations Administered    No immunizations on file.

## 2020-09-30 NOTE — Progress Notes (Signed)
Patient ID: Maurice Fischer, male    DOB: 03-07-1951, 69 y.o.   MRN: [id number]  PCP: Towanda Malkin, MD  No chief complaint on file.   Subjective:   Maurice Fischer is a 69 y.o. male, presents to clinic with CC of the following:  No chief complaint on file.   Patient is a 69 year old male His last visit with me was 05/15/2020  He had labs drawn after that visit in August, and communication to him after those labs were reviewed was as follows:   The A1C was slightly higher at 7.5 The CMP showed that the glucose was high at 139,and was otherwise normal. The CBC was normal The PSA was good at 0.6 The lipid panel remains good, LDL was 60, with goal of <70.  As the pharmacist noted in June, changing to Jardiance at a higher dose (25 mg) and stopping the steglatro would be a good change in your regimen presently, helping get better control of your blood sugars. You noted last visit when we discussed this wanting to check out the cost  Patient agreed with starting the Jardiance 25 mg daily after that last visit, and was recommended by the pharmacist on review in his note.  Ordered to his pharmacy. Also will stop the [facility] when it starts the [employer] up today. All in all, doing well  Hypertension Medications -atenolol 50mg  daily, quinapril 10mg  daily,was reduced from 20 mg a while back due to lightheadedness episodes about 2 years ago.   Did note last visit to potentially reduce it further to 5 mg daily pending blood pressure checks at home and any concerns with lightheadedness arising.  checks BP's at home occasionally, normally 110/ 60's, can be lower at times, has remained on the 10 mg presently Denies chest pain,palpitations, shortness of breath,increased headaches, blurry vision, increased lower extremity swelling BP Readings from Last 3 Encounters:  10/06/20 110/66  08/27/20 120/66  05/15/20 124/72    Diabetes Mellitus Medications taking  -metformin 1000mg , Jardiance-25 mg daily, januvia 100mg  daily  Checks blood sugarsmost daysfasting in the mornings; 30 day ave - 134  137 this am. Tries to watch diet although admits not that strict recently No reg exercise, tries to stay active, goes to gym one day a week recent past deniesfrequency, increased thirst, numbness and tingling in the ext's Eye exam is UTD, last one Nov He is on ACEI and Statin, Lab Results  Component Value Date   HGBA1C 7.5 (H) 06/05/2020   HGBA1C 7.4 (H) 01/07/2020   HGBA1C 6.3 09/09/2019   Lab Results  Component Value Date   MICROALBUR 0.5 10/11/2018   LDLCALC 60 06/05/2020   CREATININE 0.88 06/05/2020    Wt Readings from Last 3 Encounters:  10/06/20 171 lb 1.6 oz (77.6 kg)  08/27/20 167 lb 9.6 oz (76 kg)  05/15/20 170 lb 11.2 oz (77.4 kg)   Weight had been decreasing some since March, remains stable  Osteoarthritis No worsening symptoms recent past.  Noted pain and stiffness in knees, fingers joints in past,bought voltaren gel but hasn't used it yet.  Hyperlipidemia/Atherosclerosis Medication regimen-atorvastatin 40mg nightly.  He takes it daily as prescribed Sees Dr. Iona Coach cardiologyand had last appointment9/22/2021,  + h/o MI, doing well.  Assessment/plan from that last cardiology visit was as follows:  69 y.o. male with  1. Angina pectoris (CMS-HCC)  2. Coronary artery disease of native artery of native heart with stable angina pectoris (CMS-HCC)  3. Essential  hypertension  4. Hypercholesterolemia  5. Former smoker  6. History of myocardial infarction  7. DOE (dyspnea on exertion)   Plan  1 angina stable known coronary disease previous myocardial infarction recommend conservative medical therapy patient currently on aspirin atenolol Lipitor quinapril 2 diabetes type 2 reasonably controlled maintained on Metformin Januvia andewrtugliflozin 3 Hyperlipidemia stable on Lipitor therapy for lipid management 4  dyspnea on exertion moderate activity otherwise reasonably asymptomatic recommend increase aerobic exercise like walking 5 hypertension reasonably controlled patient is on quinapril and atenolol 6 have the patient follow-up in 1 year  Lipids at goal  Lab Results  Component Value Date   CHOL 120 06/05/2020   HDL 43 06/05/2020   LDLCALC 60 06/05/2020   TRIG 83 06/05/2020   CHOLHDL 2.8 06/05/2020     Vitamin D Deficiency He is taking2,000IU OTC supplementationandalso noted he takes a multivitamin has some vitamin D in it.  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  Worse in the spring and fall  Tobacco-former smoker  He noted he used to see urology, and PSAs have always been excellent. His last PSA was in 2019. His urologist stopped doing prostate exams routinely, as he noted were uncomfortable for him, and have been following PSAs.Agreed to check every couple years. Lab Results  Component Value Date   PSA1 1.0 09/16/2015   PSA 0.6 06/05/2020   PSA 0.7 07/09/2018   PSA 0.6 02/23/2017           Patient Active Problem List   Diagnosis Date Noted  . Seasonal allergic rhinitis 09/09/2019  . Coronary artery disease involving native coronary artery of native heart without angina pectoris 07/09/2018  . Shoulder impingement 09/28/2016  . DM (diabetes mellitus), type 2, uncontrolled ([place]) 07/05/2016  . Overweight (BMI 25.0-29.9) 04/04/2016  . Hypertension 04/04/2016  . Vitamin D deficiency 04/04/2016  . Hyperlipidemia LDL goal <70 09/16/2015  . Osteoarthritis 09/16/2015  . 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 Hypertriglyceridemia 05/28/2015  . Inguinal hernia 05/28/2015  . Umbilical hernia 00/93/8182  . Atherosclerosis of coronary artery 02/08/2007  . LBP (low back pain) 02/08/2007      Current  Outpatient Medications:  .  aspirin 81 MG tablet, Take 81 mg by mouth daily., Disp: , Rfl:  .  atenolol (TENORMIN) 50 MG tablet, TAKE 1 TABLET (50 MG TOTAL) BY MOUTH DAILY., Disp: 90 tablet, Rfl: 1 .  atorvastatin (LIPITOR) 40 MG tablet, Take 1 tablet (40 mg total) by mouth daily., Disp: 90 tablet, Rfl: 1 .  Cholecalciferol (VITAMIN D3) 2000 units TABS, Take 1 tablet by mouth daily., Disp: , Rfl:  .  empagliflozin (JARDIANCE) 25 MG TABS tablet, Take 1 tablet (25 mg total) by mouth daily before breakfast., Disp: 90 tablet, Rfl: 1 .  glucose blood (ONE TOUCH ULTRA TEST) test strip, Use as instructed to check fingerstick blood sugars once a day (if desired); LON 99 months, Dx E11.65, Disp: 100 each, Rfl: 3 .  JANUVIA 100 MG tablet, TAKE 1 TABLET BY MOUTH ONCE DAILY, Disp: 90 tablet, Rfl: 1 .  metFORMIN (GLUCOPHAGE) 1000 MG tablet, TAKE 1 TABLET BY MOUTH TWICE DAILY WITH A MEAL, Disp: 180 tablet, Rfl: 1 .  Multiple Vitamin (MULTIVITAMIN) tablet, Take 1 tablet by mouth daily., Disp: , Rfl:  .  quinapril (ACCUPRIL) 10 MG tablet, TAKE 1 TABLET  BY MOUTH ONCE DAILY, Disp: 90 tablet, Rfl: 1   No Known Allergies   Past Surgical History:  Procedure Laterality Date  . blister cut open on right thumb   03/2017  . COLONOSCOPY  2004  . [place]?  [name] HERNIA REPAIR  79/11/4095   umbilical hernia/ Dr Jamal Collin  . HERNIA REPAIR  01/13/2014   right inguinal hernia repair/ Dr Jamal Collin  . TONSILLECTOMY  1957?     Family History  Problem Relation Age of Onset  . Cancer Father        prostate  . Coronary artery disease Mother   . Healthy Sister   . Depression Brother      Social History   Tobacco Use  . Smoking status: Former Smoker    Packs/day: 2.00    Years: 20.00    Pack years: 40.00    Types: Cigarettes    Quit date: 10/08/1992    Years since quitting: 28.0  . Smokeless tobacco: Never Used  . Tobacco comment: smoking cessation materials not required  Substance Use Topics  .  Alcohol use: Yes    Alcohol/week: 7.0 standard drinks    Types: 7 Standard drinks or equivalent per week    With staff assistance, above reviewed with the patient today.  ROS: As per HPI, otherwise no specific complaints on a limited and focused system review   No results found for this or any previous visit (from the past 72 hour(s)).   PHQ2/9: Depression screen [facility] 2/9 10/06/2020 08/27/2020 05/15/2020 01/07/2020 09/09/2019  Decreased Interest 0 0 0 0 0  Down, Depressed, Hopeless 0 0 0 0 0  PHQ - 2 Score 0 0 0 0 0  Altered sleeping - - 0 1 0  Tired, decreased energy - - 0 0 0  Change in appetite - - 0 0 0  Feeling bad or failure about yourself  - - 0 0 0  Trouble concentrating - - 0 0 0  Moving slowly or fidgety/restless - - 0 0 0  Suicidal thoughts - - 0 0 0  PHQ-9 Score - - 0 1 0  Difficult doing work/chores - - Not difficult at all Not difficult at all Not difficult at all  Some recent data might be hidden   PHQ-2/9 Result is neg  Fall Risk: Fall Risk  10/06/2020 08/27/2020 05/15/2020 01/07/2020 09/09/2019  Falls in the past year? 0 0 0 0 0  Number falls in past yr: 0 0 0 0 0  Injury with Fall? 0 0 0 0 0  Risk for fall due to : - No Fall Risks - - -  Risk for fall due to: Comment - - - - -  Follow up - Falls prevention discussed - - Falls evaluation completed      Objective:   Vitals:   10/06/20 0740  BP: 110/66  Pulse: 71  Resp: 16  Temp: (!) 97.5 F (36.4 C)  TempSrc: Oral  SpO2: 98%  Weight: 171 lb 1.6 oz (77.6 kg)  Height: 5\' 8"  (1.727 m)    Body mass index is 26.02 kg/m.  Physical Exam   NAD, masked, pleasant HEENT - [place]/AT, sclera anicteric,+ glasses,PERRL, EOMI, conj - non-inj'ed,  pharynx clear Neck - supple, no adenopathy, no TM, carotids 2+ and = without bruits bilat Car - RRR without m/g/r Pulm- RR and effort normal at rest, CTA without wheeze or rales Abd - soft, NT diffusely, ND, Back - no CVA tenderness Ext -  no LE edema,   Neuro/psychiatric - affect was not flat, appropriate with conversation Alert and oriented Grossly non-focal  Speech  normal   Results for orders placed or performed in visit on 08/28/20  [facility] DIABETES EYE EXAM  Result Value Ref Range   [facility] Diabetic Eye Exam No Retinopathy No Retinopathy   Aic today -ordered to have a fingerstick in the office, was told they could not do it as they do not have strips to do the test.    Assessment & Plan:    1. Essential hypertension Blood pressure has remained controlled, and concerned it may be too well controlled at times again discussed, with an occasional check at home running lower, felt a little lightheaded, although denied any other more concerning symptoms.  I do think being on the medicine (ACE inhibitor) will be helpful given his diabetes and its benefits for the kidneys, so do not want to stop it completely, but again today noted if he is having any recurrence of lightheadedness feelings, lower blood pressure readings, he can half that to 5 mg daily and continue to monitor and check his response. Also should continue the atenolol.  2. Uncontrolled type 2 diabetes mellitus with hyperglycemia (HCC) Added Jardiance to his regimen after last A1c reviewed.  Tolerating medicines presently A1c was to be checked today in the office, although could not be done as noted above. Discussed getting a lab draw to check, and he notes his sugars have remained controlled at home, and mutually agreed to check his A1c again on follow-up. Also will check a urine for microalbumin on that follow-up visit.  3. Hyperlipidemia LDL goal <70 Continue with the statin product. Recent lipid panel checked noted was at goal. His weight has been stable, with a healthy weight maintenance importance noted again today.   4. Atherosclerosis of native coronary artery of native heart without angina pectoris/h/o MI Continues to follow with  cardiology,  Continue his current medication regimen, aspirin, a statin, a beta-blocker, and an ACE inhibitor.  5. Primary osteoarthritis involving multiple joints Has been stable in the recent past.  6. Vitamin D deficiency Continues on the vitamin D supplement,   7. Seasonal allergic rhinitis, unspecified trigger Has not had recent concerns, notes mostly in the spring and fall when more bothersome  8. Screening for prostate cancer Discussed the role of PSAs previously and checking for prostate cancer, and as previously agreed, will still follow Last PSA in August 2021 remained good  We will schedule follow-up again in 3 months time, sooner as needed. He is aware the follow-up will be with a different provider as I will be leaving this practice prior to his planned follow-up visit    Towanda Malkin, MD 10/06/20 8:05 AM

## 2020-10-06 ENCOUNTER — Encounter: Payer: Self-pay | Admitting: Internal Medicine

## 2020-10-06 ENCOUNTER — Other Ambulatory Visit: Payer: Self-pay

## 2020-10-06 ENCOUNTER — Ambulatory Visit (INDEPENDENT_AMBULATORY_CARE_PROVIDER_SITE_OTHER): Payer: Medicare Other | Admitting: Internal Medicine

## 2020-10-06 VITALS — BP 110/66 | HR 71 | Temp 97.5°F | Resp 16 | Ht 68.0 in | Wt 171.1 lb

## 2020-10-06 DIAGNOSIS — I10 Essential (primary) hypertension: Secondary | ICD-10-CM

## 2020-10-06 DIAGNOSIS — E785 Hyperlipidemia, unspecified: Secondary | ICD-10-CM

## 2020-10-06 DIAGNOSIS — I2510 Atherosclerotic heart disease of native coronary artery without angina pectoris: Secondary | ICD-10-CM | POA: Diagnosis not present

## 2020-10-06 DIAGNOSIS — Z125 Encounter for screening for malignant neoplasm of prostate: Secondary | ICD-10-CM

## 2020-10-06 DIAGNOSIS — M159 Polyosteoarthritis, unspecified: Secondary | ICD-10-CM

## 2020-10-06 DIAGNOSIS — J302 Other seasonal allergic rhinitis: Secondary | ICD-10-CM

## 2020-10-06 DIAGNOSIS — E1165 Type 2 diabetes mellitus with hyperglycemia: Secondary | ICD-10-CM | POA: Diagnosis not present

## 2020-10-06 DIAGNOSIS — M8949 Other hypertrophic osteoarthropathy, multiple sites: Secondary | ICD-10-CM

## 2020-10-06 DIAGNOSIS — E559 Vitamin D deficiency, unspecified: Secondary | ICD-10-CM

## 2020-10-13 ENCOUNTER — Other Ambulatory Visit: Payer: Self-pay | Admitting: Internal Medicine

## 2020-10-13 DIAGNOSIS — I2510 Atherosclerotic heart disease of native coronary artery without angina pectoris: Secondary | ICD-10-CM

## 2020-10-13 DIAGNOSIS — E785 Hyperlipidemia, unspecified: Secondary | ICD-10-CM

## 2020-12-18 ENCOUNTER — Other Ambulatory Visit: Payer: Self-pay | Admitting: Family Medicine

## 2020-12-18 ENCOUNTER — Other Ambulatory Visit: Payer: Self-pay

## 2020-12-18 ENCOUNTER — Other Ambulatory Visit: Payer: Self-pay | Admitting: Internal Medicine

## 2020-12-18 DIAGNOSIS — E1165 Type 2 diabetes mellitus with hyperglycemia: Secondary | ICD-10-CM

## 2020-12-18 MED ORDER — EMPAGLIFLOZIN 25 MG PO TABS: 25.0000 mg | ORAL_TABLET | Freq: Every day | ORAL | 1 refills | Status: DC

## 2020-12-18 MED ORDER — EMPAGLIFLOZIN 25 MG PO TABS: 25.0000 mg | ORAL_TABLET | Freq: Every day | ORAL | 0 refills | Status: DC

## 2020-12-18 NOTE — Telephone Encounter (Signed)
Copied from [place] [id number]. Topic: Quick Communication - Rx Refill/Question >> Dec 18, 2020  2:44 PM Mcneil, Ja-Kwan wrote: Medication: empagliflozin (JARDIANCE) 25 MG TABS tablet  Has the patient contacted their pharmacy? yes  Preferred Pharmacy (with phone number or street name): [place], [place] - [address]  Phone: [phone]  Fax: [phone]  Agent: Please be advised that RX refills may take up to 3 business days. We ask that you follow-up with your pharmacy.

## 2020-12-30 ENCOUNTER — Ambulatory Visit: Payer: Medicare Other | Admitting: Family Medicine

## 2021-01-14 ENCOUNTER — Other Ambulatory Visit: Payer: Self-pay | Admitting: Family Medicine

## 2021-01-14 ENCOUNTER — Other Ambulatory Visit: Payer: Self-pay

## 2021-01-14 DIAGNOSIS — E11618 Type 2 diabetes mellitus with other diabetic arthropathy: Secondary | ICD-10-CM

## 2021-01-14 DIAGNOSIS — I10 Essential (primary) hypertension: Secondary | ICD-10-CM

## 2021-01-14 DIAGNOSIS — I2510 Atherosclerotic heart disease of native coronary artery without angina pectoris: Secondary | ICD-10-CM

## 2021-01-14 MED ORDER — SITAGLIPTIN PHOSPHATE 100 MG PO TABS: 100.0000 mg | ORAL_TABLET | Freq: Every day | ORAL | 0 refills | Status: DC

## 2021-01-14 MED ORDER — QUINAPRIL HCL 10 MG PO TABS: 10.0000 mg | ORAL_TABLET | Freq: Every day | ORAL | 0 refills | Status: DC

## 2021-02-05 ENCOUNTER — Other Ambulatory Visit: Payer: Self-pay

## 2021-02-05 ENCOUNTER — Ambulatory Visit: Payer: Medicare Other | Attending: Internal Medicine

## 2021-02-05 DIAGNOSIS — Z23 Encounter for immunization: Secondary | ICD-10-CM

## 2021-02-05 MED ORDER — MODERNA COVID-19 VACCINE 100 MCG/0.5ML IM SUSP
INTRAMUSCULAR | 0 refills | Status: DC
  Filled 2021-02-05: qty 0.25, 1d supply, fill #0

## 2021-02-05 MED ORDER — MODERNA COVID-19 VACCINE 100 MCG/0.5ML IM SUSP
INTRAMUSCULAR | 0 refills | Status: DC
  Filled 2021-02-05: qty 0.3, 1d supply, fill #0

## 2021-02-05 NOTE — Progress Notes (Signed)
   Covid-19 Vaccination Clinic  Name:  Maurice Fischer    MRN: [id number] DOB: [birth year]  02/05/2021  [name] was observed post Covid-19 immunization for 15 minutes without incident. He was provided with Vaccine Information Sheet and instruction to access the V-Safe system.   [name] was instructed to call 911 with any severe reactions post vaccine: [name] Difficulty breathing  . Swelling of face and throat  . A fast heartbeat  . A bad rash all over body  . Dizziness and weakness   Immunizations Administered    Name Date Dose VIS Date Route   Moderna Covid-19 Booster Vaccine 02/05/2021 10:28 AM 0.25 mL 08/12/2020 Intramuscular   Manufacturer: Moderna   Lot: [id number]   [place]: [id number]

## 2021-02-19 ENCOUNTER — Ambulatory Visit (INDEPENDENT_AMBULATORY_CARE_PROVIDER_SITE_OTHER): Payer: Medicare Other | Admitting: Family Medicine

## 2021-02-19 ENCOUNTER — Other Ambulatory Visit: Payer: Self-pay

## 2021-02-19 ENCOUNTER — Encounter: Payer: Self-pay | Admitting: Family Medicine

## 2021-02-19 VITALS — BP 126/72 | HR 73 | Temp 98.0°F | Resp 16 | Ht 68.0 in | Wt 172.9 lb

## 2021-02-19 DIAGNOSIS — E785 Hyperlipidemia, unspecified: Secondary | ICD-10-CM

## 2021-02-19 DIAGNOSIS — I10 Essential (primary) hypertension: Secondary | ICD-10-CM

## 2021-02-19 DIAGNOSIS — I447 Left bundle-branch block, unspecified: Secondary | ICD-10-CM

## 2021-02-19 DIAGNOSIS — E11618 Type 2 diabetes mellitus with other diabetic arthropathy: Secondary | ICD-10-CM

## 2021-02-19 DIAGNOSIS — I209 Angina pectoris, unspecified: Secondary | ICD-10-CM

## 2021-02-19 DIAGNOSIS — I2510 Atherosclerotic heart disease of native coronary artery without angina pectoris: Secondary | ICD-10-CM | POA: Diagnosis not present

## 2021-02-19 DIAGNOSIS — I252 Old myocardial infarction: Secondary | ICD-10-CM

## 2021-02-19 DIAGNOSIS — R079 Chest pain, unspecified: Secondary | ICD-10-CM

## 2021-02-19 MED ORDER — METFORMIN HCL 1000 MG PO TABS
ORAL_TABLET | Freq: Two times a day (BID) | ORAL | 3 refills | Status: DC
  Filled 2021-02-19 – 2021-02-22 (×2): qty 180, 90d supply, fill #0
  Filled 2021-05-25: qty 180, 90d supply, fill #1
  Filled 2021-08-30: qty 180, 90d supply, fill #2
  Filled 2021-11-24: qty 180, 90d supply, fill #3

## 2021-02-19 MED ORDER — SITAGLIPTIN PHOSPHATE 100 MG PO TABS
ORAL_TABLET | Freq: Every day | ORAL | 3 refills | Status: DC
  Filled 2021-02-19: qty 90, 90d supply, fill #0
  Filled 2021-04-14: qty 30, 30d supply, fill #0
  Filled 2021-05-18: qty 30, 30d supply, fill #1
  Filled 2021-06-17: qty 30, 30d supply, fill #2
  Filled 2021-07-15: qty 30, 30d supply, fill #3
  Filled 2021-08-17: qty 30, 30d supply, fill #4
  Filled 2021-09-14: qty 30, 30d supply, fill #5
  Filled 2021-10-14: qty 30, 30d supply, fill #6
  Filled 2021-11-15: qty 30, 30d supply, fill #7
  Filled 2021-12-15: qty 30, 30d supply, fill #8
  Filled 2022-01-13: qty 30, 30d supply, fill #9
  Filled 2022-02-13: qty 30, 30d supply, fill #10

## 2021-02-19 MED ORDER — GLUCOSE BLOOD VI STRP
ORAL_STRIP | 3 refills | Status: DC
  Filled 2021-02-19: qty 100, 90d supply, fill #0

## 2021-02-19 MED ORDER — ATORVASTATIN CALCIUM 40 MG PO TABS
ORAL_TABLET | Freq: Every day | ORAL | 3 refills | Status: DC
  Filled 2021-02-19 – 2021-04-14 (×2): qty 90, 90d supply, fill #0
  Filled 2021-07-21: qty 90, 90d supply, fill #1
  Filled 2021-10-20: qty 90, 90d supply, fill #2
  Filled 2022-01-13: qty 90, 90d supply, fill #3

## 2021-02-19 MED ORDER — QUINAPRIL HCL 10 MG PO TABS
ORAL_TABLET | Freq: Every day | ORAL | 3 refills | Status: DC
  Filled 2021-02-19 – 2021-04-14 (×2): qty 90, 90d supply, fill #0

## 2021-02-19 MED ORDER — ATENOLOL 50 MG PO TABS
ORAL_TABLET | Freq: Every day | ORAL | 3 refills | Status: DC
  Filled 2021-02-19 – 2021-03-04 (×2): qty 90, 90d supply, fill #0

## 2021-02-19 MED FILL — Empagliflozin Tab 25 MG: ORAL | 30 days supply | Qty: 30 | Fill #0 | Status: AC

## 2021-02-19 NOTE — Progress Notes (Signed)
Name: Maurice Fischer   MRN: [id number]    DOB: Mar 14, 1951   Date:02/19/2021       Progress Note  Chief Complaint  Patient presents with  . Follow-up  . Hypertension  . Hyperlipidemia  . Diabetes     Subjective:   Maurice Fischer is a 70 y.o. male, presents to clinic for   DM:   Pt managing DM with metformin 1000 mg BID, januvia 100 and jardiance  25 Reports good med compliance Pt has no SE from meds. Blood sugars checking in the am, usually low 100's, high of 200 if he ate sweets the night  before Denies: Polyuria, polydipsia, vision changes, neuropathy, hypoglycemia Recent pertinent labs: Lab Results  Component Value Date   HGBA1C 7.5 (H) 06/05/2020   HGBA1C 7.4 (H) 01/07/2020   HGBA1C 6.3 09/09/2019   Standard of care and health maintenance: Foot exam:  due DM eye exam:  UTD  ACEI/ARB:  yes Statin:  yes  Hypertension:  Currently managed on quinapril Some higher readings 190's feels some HR increase more trouble breathing - meds were reduced a while ago due to hypotension (20 mg to 10)  Pt reports good med compliance and denies any SE.   Blood pressure today is well controlled. BP Readings from Last 3 Encounters:  02/19/21 126/72  10/06/20 110/66  08/27/20 120/66   Pt denies CP, SOB, exertional sx, LE edema, palpitation, Ha's, visual disturbances, lightheadedness, hypotension, syncope.   High BP episodes - usually stressed and transient and BP normalizes after resting In the past he was on higher dose of meds but lowered due to hypotension - reviewed med dosing and BP trends, likely 2 years ago by Benjamine Mola NP due to hypotension    BP Readings from Last 10 Encounters:  02/19/21 126/72  10/06/20 110/66  08/27/20 120/66  05/15/20 124/72  01/07/20 122/64  09/09/19 120/60  07/25/19 (!) 98/52  05/08/19 122/60  01/16/19 134/72  10/11/18 118/64   Sees [facility] - episodes of exertional sx for many years, more frequent in the past year  Has done stress  tests and angiogram MI in 1993- doesn't carry SLNG any more Appt coming up in Sept Sometimes if he does to exercise or push the mower, at the beginning of exertion he feels chest discomfort and dypnea and he will immediately stop activity Other times lately it has occurred with stress/getting angry, BP >190/100, improved in short amount of time if he rested Has become more frequent Denies episodes of CP at rest or lasting more than 20 min Denies associated HA, visual disturbances, palpitations, LE edema, near syncope/syncope, diaphoresis, N/V/D  He denies any past PCI/stent    Current Outpatient Medications:  .  aspirin 81 MG tablet, Take 81 mg by mouth daily., Disp: , Rfl:  .  atenolol (TENORMIN) 50 MG tablet, TAKE 1 TABLET (50 MG TOTAL) BY MOUTH DAILY., Disp: 90 tablet, Rfl: 1 .  atorvastatin (LIPITOR) 40 MG tablet, TAKE 1 TABLET (40 MG TOTAL) BY MOUTH DAILY., Disp: 90 tablet, Rfl: 1 .  Cholecalciferol (VITAMIN D3) 2000 units TABS, Take 1 tablet by mouth daily., Disp: , Rfl:  .  COVID-19 mRNA vaccine, Moderna, (MODERNA COVID-19 VACCINE) 100 MCG/0.5ML injection, Inject into the muscle., Disp: 0.25 mL, Rfl: 0 .  COVID-19 mRNA vaccine, Moderna, 100 MCG/0.5ML injection, USE AS DIRECTED, Disp: .25 mL, Rfl: 0 .  empagliflozin (JARDIANCE) 25 MG TABS tablet, TAKE 1 TABLET BY MOUTH DAILY BEFORE BREAKFAST., Disp: 90 tablet, Rfl: 1 .  metFORMIN (GLUCOPHAGE) 1000 MG tablet, TAKE 1 TABLET BY MOUTH TWICE DAILY WITH A MEAL, Disp: 180 tablet, Rfl: 1 .  Multiple Vitamin (MULTIVITAMIN) tablet, Take 1 tablet by mouth daily., Disp: , Rfl:  .  quinapril (ACCUPRIL) 10 MG tablet, TAKE 1 TABLET BY MOUTH DAILY., Disp: 90 tablet, Rfl: 0 .  sitaGLIPtin (JANUVIA) 100 MG tablet, TAKE 1 TABLET BY MOUTH DAILY., Disp: 90 tablet, Rfl: 0  Patient Active Problem List   Diagnosis Date Noted  . Seasonal allergic rhinitis 09/09/2019  . Coronary artery disease involving native coronary artery of native heart without angina  pectoris 07/09/2018  . Shoulder impingement 09/28/2016  . DM (diabetes mellitus), type 2, uncontrolled ([place]) 07/05/2016  . Overweight (BMI 25.0-29.9) 04/04/2016  . Hypertension 04/04/2016  . Vitamin D deficiency 04/04/2016  . Hyperlipidemia LDL goal <70 09/16/2015  . Osteoarthritis 09/16/2015  . 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 Hypertriglyceridemia 05/28/2015  . Inguinal hernia 05/28/2015  . Umbilical hernia 38/75/6433  . Atherosclerosis of coronary artery 02/08/2007  . LBP (low back pain) 02/08/2007    Past Surgical History:  Procedure Laterality Date  . blister cut open on right thumb   03/2017  . COLONOSCOPY  2004  . [place]?  [name] HERNIA REPAIR  29/51/8841   umbilical hernia/ Dr Jamal Collin  . HERNIA REPAIR  01/13/2014   right inguinal hernia repair/ Dr Jamal Collin  . TONSILLECTOMY  1957?    Family History  Problem Relation Age of Onset  . Cancer Father        prostate  . Coronary artery disease Mother   . Healthy Sister   . Depression Brother     Social History   Tobacco Use  . Smoking status: Former Smoker    Packs/day: 2.00    Years: 20.00    Pack years: 40.00    Types: Cigarettes    Quit date: 10/08/1992    Years since quitting: 28.3  . Smokeless tobacco: Never Used  . Tobacco comment: smoking cessation materials not required  Vaping Use  . Vaping Use: Never used  Substance Use Topics  . Alcohol use: Yes    Alcohol/week: 7.0 standard drinks    Types: 7 Standard drinks or equivalent per week  . Drug use: No     No Known Allergies  Health Maintenance  Topic Date Due  . FOOT EXAM  05/07/2020  . HEMOGLOBIN A1C  12/06/2020  . INFLUENZA VACCINE  05/24/2021  . OPHTHALMOLOGY EXAM  08/25/2021  . TETANUS/TDAP  03/05/2023  . COLONOSCOPY (Pts 45-69yrs Insurance coverage will need to be confirmed)  01/08/2024  . COVID-19 Vaccine  Completed  . Hepatitis C Screening  Completed  . PNA vac Low Risk Adult  Completed  . HPV VACCINES  Aged Out    Chart Review  Today: I personally reviewed active problem list, medication list, allergies, family history, social history, health maintenance, notes from last encounter, lab results, imaging with the patient/caregiver today.   Review of Systems  Constitutional: Negative.   HENT: Negative.   Eyes: Negative.   Respiratory: Negative.   Cardiovascular: Negative.   Gastrointestinal: Negative.   Endocrine: Negative.   Genitourinary: Negative.   Musculoskeletal: Negative.   Skin: Negative.   Allergic/Immunologic: Negative.   Neurological: Negative.   Hematological: Negative.   Psychiatric/Behavioral: Negative.   all other systems reviewed and are negative for acute change except as noted in the HPI.   Objective:   Vitals:   02/19/21 1003  BP: 126/72  Pulse:  73  Resp: 16  Temp: 98 F (36.7 C)  SpO2: 99%  Weight: 172 lb 14.4 oz (78.4 kg)  Height: 5\' 8"  (1.727 m)    Body mass index is 26.29 kg/m.  Physical Exam Vitals and nursing note reviewed.  Constitutional:      General: He is not in acute distress.    Appearance: Normal appearance. He is well-developed and normal weight. He is not ill-appearing, toxic-appearing or diaphoretic.     Interventions: Face mask in place.  HENT:     Head: Normocephalic and atraumatic.     Jaw: No trismus.     Right Ear: External ear normal.     Left Ear: External ear normal.  Eyes:     General: Lids are normal. No scleral icterus.       Right eye: No discharge.        Left eye: No discharge.     Conjunctiva/sclera: Conjunctivae normal.  Neck:     Trachea: Trachea and phonation normal. No tracheal deviation.  Cardiovascular:     Rate and Rhythm: Normal rate and regular rhythm.     Pulses: Normal pulses.          Radial pulses are 2+ on the right side and 2+ on the left side.       Posterior tibial pulses are 2+ on the right side and 2+ on the left side.     Heart sounds: Normal heart sounds. No murmur heard. No friction rub. No gallop.    Pulmonary:     Effort: Pulmonary effort is normal. No respiratory distress.     Breath sounds: Normal breath sounds. No stridor. No wheezing, rhonchi or rales.  Abdominal:     General: Bowel sounds are normal. There is no distension.     Palpations: Abdomen is soft.  Musculoskeletal:     Right lower leg: No edema.     Left lower leg: No edema.  Skin:    General: Skin is warm and dry.     Coloration: Skin is not jaundiced.     Findings: No rash.     Nails: There is no clubbing.  Neurological:     Mental Status: He is alert. Mental status is at baseline.     Cranial Nerves: No dysarthria or facial asymmetry.     Motor: No tremor or abnormal muscle tone.     Gait: Gait normal.  Psychiatric:        Mood and Affect: Mood normal.        Speech: Speech normal.        Behavior: Behavior normal. Behavior is cooperative.     Diabetic Foot Exam - Simple   Simple Foot Form Diabetic Foot exam was performed with the following findings: Yes 02/19/2021 10:52 AM  Visual Inspection No deformities, no ulcerations, no other skin breakdown bilaterally: Yes Sensation Testing Intact to touch and monofilament testing bilaterally: Yes Pulse Check Posterior Tibialis and Dorsalis pulse intact bilaterally: Yes Comments      ECG interpretation   Date: 02/19/21  Rate: 67  Rhythm: sinus rhythm  QRS Axis: normal  Intervals: PR and QTc normal  ST/T Wave abnormalities: TWI - inferior leads where on prior ECG in 2019, lateral I II V5 and V6 new - J point elevation V1-V3 on past ECG  Conduction Disutrbances:  LBBB new   Narrative Interpretation: sinus rhythm LBBB abnormal ECG  Old EKG Reviewed: yes -  significant changes noted- as above - new LBBB  Assessment & Plan:   1. Essential hypertension Has been well controlled, BP at goal today, episodes at home high, brief/transient, monitor BP average, if > 140/90 then may need to increase quinapril dose back to 20 mg, however previously  decreased due to hypotension - atenolol (TENORMIN) 50 MG tablet; TAKE 1 TABLET (50 MG TOTAL) BY MOUTH DAILY.  Dispense: 90 tablet; Refill: 3 - quinapril (ACCUPRIL) 10 MG tablet; TAKE 1 TABLET BY MOUTH DAILY.  Dispense: 90 tablet; Refill: 3 - COMPLETE METABOLIC PANEL WITH GFR  2. 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  Dispense: 90 tablet; Refill: 3 - metFORMIN (GLUCOPHAGE) 1000 MG tablet; TAKE 1 TABLET BY MOUTH TWICE DAILY WITH A MEAL  Dispense: 180 tablet; Refill: 3 - Hemoglobin A1C - COMPLETE METABOLIC PANEL WITH GFR  3. Hyperlipidemia LDL goal <70 Compliant with meds, no SE, no myalgias, fatigue or jaundice - atorvastatin (LIPITOR) 40 MG tablet; TAKE 1 TABLET (40 MG TOTAL) BY MOUTH DAILY.  Dispense: 90 tablet; Refill: 3  4. Atherosclerosis of native coronary artery of native heart, unspecified whether angina present Medically managed, per Dr. Clayborn Bigness - atenolol (TENORMIN) 50 MG tablet; TAKE 1 TABLET (50 MG TOTAL) BY MOUTH DAILY.  Dispense: 90 tablet; Refill: 3 - atorvastatin (LIPITOR) 40 MG tablet; TAKE 1 TABLET (40 MG TOTAL) BY MOUTH DAILY.  Dispense: 90 tablet; Refill: 3 - quinapril (ACCUPRIL) 10 MG tablet; TAKE 1 TABLET BY MOUTH DAILY.  Dispense: 90 tablet; Refill: 3 - COMPLETE METABOLIC PANEL WITH GFR  5. Chest pain, unspecified type More frequent over the past year with activity or getting upset, associated elevated BP and dyspnea with episodes, currently no CP EKG changes - advised pt to f/up with cardiology sooner than next routine f/up appt No past ECHO, stress test or cath available in chart - reviewed past 2 OV with cardiology and no EKG noted and no cardiac testing or results available in [facility] EMR or through care everywhere records Consider stress test - EKG  12-Lead  6. Left bundle-branch block New compared to last ECG, no noted EKG in [facility] records  7. History of MI (myocardial infarction) 1993 - medically managed - aspirin atenolol Lipitor quinapril 8. Angina pectoris [facility])   F/up cardiology -  Medically managed - aspirin atenolol Lipitor quinapril  Message sent to pts' cardiologist with info from visit and my desire that he get f/up for CP and new LBBB   Return in about 6 months (around 08/21/2021) for Routine follow-up.   Delsa Grana, PA-C 02/19/21 10:24 AM

## 2021-02-20 LAB — COMPLETE METABOLIC PANEL WITH GFR
AG Ratio: 1.8 (calc) (ref 1.0–2.5)
ALT: 37 U/L (ref 9–46)
AST: 25 U/L (ref 10–35)
Albumin: 4.6 g/dL (ref 3.6–5.1)
Alkaline phosphatase (APISO): 54 U/L (ref 35–144)
BUN: 19 mg/dL (ref 7–25)
CO2: 30 mmol/L (ref 20–32)
Calcium: 10.3 mg/dL (ref 8.6–10.3)
Chloride: 99 mmol/L (ref 98–110)
Creat: 0.84 mg/dL (ref 0.70–1.18)
GFR, Est African American: 102 mL/min/{1.73_m2} (ref >=60)
GFR, Est Non African American: 88 mL/min/{1.73_m2} (ref >=60)
Globulin: 2.5 g/dL (calc) (ref 1.9–3.7)
Glucose, Bld: 107 mg/dL — ABNORMAL HIGH (ref 65–99)
Potassium: 5.1 mmol/L (ref 3.5–5.3)
Sodium: 138 mmol/L (ref 135–146)
Total Bilirubin: 1 mg/dL (ref 0.2–1.2)
Total Protein: 7.1 g/dL (ref 6.1–8.1)

## 2021-02-20 LAB — HEMOGLOBIN A1C
Hgb A1c MFr Bld: 7.5 % of total Hgb — ABNORMAL HIGH (ref <5.7)
Mean Plasma Glucose: 169 mg/dL
eAG (mmol/L): 9.3 mmol/L

## 2021-02-22 ENCOUNTER — Other Ambulatory Visit: Payer: Self-pay

## 2021-03-04 ENCOUNTER — Other Ambulatory Visit: Payer: Self-pay

## 2021-03-18 ENCOUNTER — Other Ambulatory Visit: Payer: Self-pay

## 2021-03-18 MED FILL — Empagliflozin Tab 25 MG: ORAL | 30 days supply | Qty: 30 | Fill #1 | Status: AC

## 2021-04-14 ENCOUNTER — Other Ambulatory Visit: Payer: Self-pay

## 2021-04-14 MED FILL — Empagliflozin Tab 25 MG: ORAL | 30 days supply | Qty: 30 | Fill #2 | Status: AC

## 2021-05-18 ENCOUNTER — Other Ambulatory Visit: Payer: Self-pay

## 2021-05-18 MED FILL — Empagliflozin Tab 25 MG: ORAL | 30 days supply | Qty: 30 | Fill #3 | Status: AC

## 2021-05-25 ENCOUNTER — Other Ambulatory Visit: Payer: Self-pay

## 2021-05-25 MED ORDER — CARVEDILOL 6.25 MG PO TABS
ORAL_TABLET | ORAL | 0 refills | Status: DC
  Filled 2021-05-25: qty 60, 30d supply, fill #0

## 2021-05-25 MED ORDER — QUINAPRIL HCL 10 MG PO TABS
ORAL_TABLET | ORAL | 0 refills | Status: DC
  Filled 2021-05-25: qty 60, 30d supply, fill #0

## 2021-06-14 ENCOUNTER — Other Ambulatory Visit
Admission: RE | Admit: 2021-06-14 | Discharge: 2021-06-14 | Disposition: A | Discharge status: Home / Self Care | Payer: Medicare Other | Source: Ambulatory Visit | Attending: Internal Medicine | Admitting: Internal Medicine

## 2021-06-14 DIAGNOSIS — I209 Angina pectoris, unspecified: Secondary | ICD-10-CM | POA: Insufficient documentation

## 2021-06-14 DIAGNOSIS — Z79899 Other long term (current) drug therapy: Secondary | ICD-10-CM | POA: Diagnosis present

## 2021-06-14 DIAGNOSIS — R0600 Dyspnea, unspecified: Secondary | ICD-10-CM | POA: Insufficient documentation

## 2021-06-14 DIAGNOSIS — I25118 Atherosclerotic heart disease of native coronary artery with other forms of angina pectoris: Secondary | ICD-10-CM | POA: Insufficient documentation

## 2021-06-14 DIAGNOSIS — I10 Essential (primary) hypertension: Secondary | ICD-10-CM | POA: Diagnosis present

## 2021-06-14 LAB — BRAIN NATRIURETIC PEPTIDE: B Natriuretic Peptide: 51 pg/mL (ref 0.0–100.0)

## 2021-06-17 ENCOUNTER — Telehealth: Payer: Self-pay | Admitting: Family Medicine

## 2021-06-17 ENCOUNTER — Ambulatory Visit
Admission: RE | Admit: 2021-06-17 | Discharge: 2021-06-17 | Disposition: A | Discharge status: Home / Self Care | Payer: Medicare Other | Attending: Internal Medicine | Admitting: Internal Medicine

## 2021-06-17 ENCOUNTER — Other Ambulatory Visit: Payer: Self-pay

## 2021-06-17 ENCOUNTER — Encounter: Payer: Self-pay | Admitting: Internal Medicine

## 2021-06-17 ENCOUNTER — Encounter
Admission: RE | Disposition: A | Discharge status: Home / Self Care | Payer: Self-pay | Source: Home / Self Care | Attending: Internal Medicine

## 2021-06-17 ENCOUNTER — Other Ambulatory Visit: Payer: Self-pay | Admitting: Emergency Medicine

## 2021-06-17 DIAGNOSIS — I25110 Atherosclerotic heart disease of native coronary artery with unstable angina pectoris: Secondary | ICD-10-CM | POA: Diagnosis present

## 2021-06-17 DIAGNOSIS — I5020 Unspecified systolic (congestive) heart failure: Secondary | ICD-10-CM | POA: Diagnosis not present

## 2021-06-17 DIAGNOSIS — E1165 Type 2 diabetes mellitus with hyperglycemia: Secondary | ICD-10-CM

## 2021-06-17 DIAGNOSIS — I2582 Chronic total occlusion of coronary artery: Secondary | ICD-10-CM | POA: Insufficient documentation

## 2021-06-17 DIAGNOSIS — I200 Unstable angina: Secondary | ICD-10-CM

## 2021-06-17 DIAGNOSIS — R9430 Abnormal result of cardiovascular function study, unspecified: Secondary | ICD-10-CM

## 2021-06-17 HISTORY — PX: LEFT HEART CATH AND CORONARY ANGIOGRAPHY: CATH118249

## 2021-06-17 LAB — GLUCOSE, CAPILLARY: Glucose-Capillary: 122 mg/dL — ABNORMAL HIGH (ref 70–99)

## 2021-06-17 SURGERY — LEFT HEART CATH AND CORONARY ANGIOGRAPHY
Anesthesia: Moderate Sedation

## 2021-06-17 MED ORDER — SODIUM CHLORIDE 0.9 % WEIGHT BASED INFUSION
3.0000 mL/kg/h | INTRAVENOUS | Status: AC
  Administered 2021-06-17: 3 mL/kg/h via INTRAVENOUS

## 2021-06-17 MED ORDER — MIDAZOLAM HCL 2 MG/2ML IJ SOLN
INTRAMUSCULAR | Status: DC | PRN
  Administered 2021-06-17: 1 mg via INTRAVENOUS

## 2021-06-17 MED ORDER — LIDOCAINE HCL 1 % IJ SOLN
INTRAMUSCULAR | Status: AC
  Filled 2021-06-17: qty 20

## 2021-06-17 MED ORDER — SODIUM CHLORIDE 0.9% FLUSH: 3.0000 mL | Freq: Two times a day (BID) | INTRAVENOUS | Status: DC

## 2021-06-17 MED ORDER — HEPARIN (PORCINE) IN NACL 1000-0.9 UT/500ML-% IV SOLN
INTRAVENOUS | Status: AC
  Filled 2021-06-17: qty 1000

## 2021-06-17 MED ORDER — SODIUM CHLORIDE 0.9% FLUSH: 3.0000 mL | INTRAVENOUS | Status: DC | PRN

## 2021-06-17 MED ORDER — LIDOCAINE HCL (PF) 1 % IJ SOLN
INTRAMUSCULAR | Status: DC | PRN
  Administered 2021-06-17: 2 mL

## 2021-06-17 MED ORDER — FENTANYL CITRATE (PF) 100 MCG/2ML IJ SOLN
INTRAMUSCULAR | Status: DC | PRN
  Administered 2021-06-17: 25 ug via INTRAVENOUS

## 2021-06-17 MED ORDER — HEPARIN (PORCINE) IN NACL 2000-0.9 UNIT/L-% IV SOLN
INTRAVENOUS | Status: DC | PRN
  Administered 2021-06-17: 1000 mL

## 2021-06-17 MED ORDER — ASPIRIN 81 MG PO CHEW: 81.0000 mg | CHEWABLE_TABLET | ORAL | Status: DC

## 2021-06-17 MED ORDER — SODIUM CHLORIDE 0.9 % IV SOLN: 250.0000 mL | INTRAVENOUS | Status: DC | PRN

## 2021-06-17 MED ORDER — EMPAGLIFLOZIN 25 MG PO TABS
ORAL_TABLET | Freq: Every day | ORAL | 0 refills | Status: DC
  Filled 2021-06-17: qty 30, 30d supply, fill #0
  Filled 2021-07-15: qty 30, 30d supply, fill #1
  Filled 2021-08-17: qty 30, 30d supply, fill #2

## 2021-06-17 MED ORDER — VERAPAMIL HCL 2.5 MG/ML IV SOLN
INTRAVENOUS | Status: AC
  Filled 2021-06-17: qty 2

## 2021-06-17 MED ORDER — IOHEXOL 350 MG/ML SOLN
INTRAVENOUS | Status: DC | PRN
  Administered 2021-06-17: 50 mL

## 2021-06-17 MED ORDER — FENTANYL CITRATE PF 50 MCG/ML IJ SOSY
PREFILLED_SYRINGE | INTRAMUSCULAR | Status: AC
  Filled 2021-06-17: qty 1

## 2021-06-17 MED ORDER — VERAPAMIL HCL 2.5 MG/ML IV SOLN
INTRAVENOUS | Status: DC | PRN
  Administered 2021-06-17: 2.5 mg via INTRAVENOUS

## 2021-06-17 MED ORDER — SODIUM CHLORIDE 0.9 % WEIGHT BASED INFUSION: 1.0000 mL/kg/h | INTRAVENOUS | Status: DC

## 2021-06-17 MED ORDER — HEPARIN SODIUM (PORCINE) 1000 UNIT/ML IJ SOLN
INTRAMUSCULAR | Status: AC
  Filled 2021-06-17: qty 1

## 2021-06-17 MED ORDER — MIDAZOLAM HCL 2 MG/2ML IJ SOLN
INTRAMUSCULAR | Status: AC
  Filled 2021-06-17: qty 2

## 2021-06-17 SURGICAL SUPPLY — 12 items
CATH INFINITI 5 FR JL3.5 (CATHETERS) ×2 IMPLANT
CATH INFINITI 5FR ANG PIGTAIL (CATHETERS) ×2 IMPLANT
CATH INFINITI JR4 5F (CATHETERS) ×2 IMPLANT
DEVICE RAD TR BAND REGULAR (VASCULAR PRODUCTS) ×2 IMPLANT
DRAPE BRACHIAL (DRAPES) ×2 IMPLANT
GLIDESHEATH SLEND SS 6F .021 (SHEATH) ×2 IMPLANT
GUIDEWIRE INQWIRE 1.5J.035X260 (WIRE) ×1 IMPLANT
INQWIRE 1.5J .035X260CM (WIRE) ×2
PACK CARDIAC CATH (CUSTOM PROCEDURE TRAY) ×2 IMPLANT
PROTECTION STATION PRESSURIZED (MISCELLANEOUS) ×2
SET ATX SIMPLICITY (MISCELLANEOUS) ×2 IMPLANT
STATION PROTECTION PRESSURIZED (MISCELLANEOUS) ×1 IMPLANT

## 2021-06-17 NOTE — Telephone Encounter (Signed)
Last seen 4.29.2022 upcoming 10.7.2022

## 2021-06-17 NOTE — Telephone Encounter (Signed)
Requested medications are due for refill today.  yes  Requested medications are on the active medications list.  yes  Last refill. 12/18/2020  Future visit scheduled.   yes  Notes to clinic.  PCP listed is Dr. Roxan Hockey.

## 2021-06-18 ENCOUNTER — Encounter: Payer: Self-pay | Admitting: Internal Medicine

## 2021-06-21 ENCOUNTER — Other Ambulatory Visit: Payer: Self-pay

## 2021-06-21 MED ORDER — CARVEDILOL 6.25 MG PO TABS
ORAL_TABLET | ORAL | 11 refills | Status: DC
  Filled 2021-06-21: qty 60, 30d supply, fill #0

## 2021-06-25 ENCOUNTER — Ambulatory Visit: Payer: Medicare Other | Admitting: Family Medicine

## 2021-07-15 ENCOUNTER — Other Ambulatory Visit: Payer: Self-pay

## 2021-07-21 ENCOUNTER — Other Ambulatory Visit: Payer: Self-pay

## 2021-07-21 MED ORDER — ISOSORBIDE MONONITRATE ER 30 MG PO TB24
ORAL_TABLET | ORAL | 11 refills | Status: DC
  Filled 2021-07-21: qty 30, 30d supply, fill #0
  Filled 2021-08-17: qty 30, 30d supply, fill #1
  Filled 2021-09-14: qty 30, 30d supply, fill #2
  Filled 2021-10-14: qty 30, 30d supply, fill #3
  Filled 2021-11-15: qty 30, 30d supply, fill #4
  Filled 2021-12-15: qty 30, 30d supply, fill #5

## 2021-07-21 MED ORDER — QUINAPRIL HCL 10 MG PO TABS
10.0000 mg | ORAL_TABLET | Freq: Two times a day (BID) | ORAL | 11 refills | Status: DC
  Filled 2021-07-21: qty 60, 30d supply, fill #0
  Filled 2021-08-17: qty 60, 30d supply, fill #1
  Filled 2021-09-22: qty 60, 30d supply, fill #2
  Filled 2021-10-20: qty 30, 15d supply, fill #3
  Filled 2021-11-07: qty 30, 15d supply, fill #4

## 2021-07-21 MED ORDER — SPIRONOLACTONE 25 MG PO TABS
ORAL_TABLET | ORAL | 2 refills | Status: DC
  Filled 2021-07-21: qty 45, 90d supply, fill #0
  Filled 2021-10-14: qty 45, 90d supply, fill #1
  Filled 2022-01-13: qty 45, 90d supply, fill #2
  Filled 2022-04-13: qty 45, 90d supply, fill #3

## 2021-07-21 MED ORDER — CARVEDILOL 12.5 MG PO TABS
ORAL_TABLET | ORAL | 7 refills | Status: DC
  Filled 2021-07-21: qty 90, 45d supply, fill #0
  Filled 2021-09-06: qty 90, 45d supply, fill #1
  Filled 2021-10-20: qty 90, 45d supply, fill #2
  Filled 2021-12-05: qty 90, 45d supply, fill #3
  Filled 2022-01-19: qty 90, 45d supply, fill #4
  Filled 2022-03-09: qty 90, 45d supply, fill #5
  Filled 2022-04-27: qty 90, 45d supply, fill #6
  Filled 2022-06-13: qty 90, 45d supply, fill #7

## 2021-07-22 ENCOUNTER — Other Ambulatory Visit: Payer: Self-pay

## 2021-07-30 ENCOUNTER — Ambulatory Visit: Payer: Medicare Other | Admitting: Family Medicine

## 2021-07-30 ENCOUNTER — Ambulatory Visit (INDEPENDENT_AMBULATORY_CARE_PROVIDER_SITE_OTHER): Payer: Medicare Other | Admitting: Nurse Practitioner

## 2021-07-30 ENCOUNTER — Encounter: Payer: Self-pay | Admitting: Nurse Practitioner

## 2021-07-30 ENCOUNTER — Other Ambulatory Visit: Payer: Self-pay

## 2021-07-30 VITALS — BP 116/60 | HR 72 | Temp 97.5°F | Resp 16 | Ht 68.0 in | Wt 167.8 lb

## 2021-07-30 DIAGNOSIS — E559 Vitamin D deficiency, unspecified: Secondary | ICD-10-CM

## 2021-07-30 DIAGNOSIS — E11618 Type 2 diabetes mellitus with other diabetic arthropathy: Secondary | ICD-10-CM | POA: Diagnosis not present

## 2021-07-30 DIAGNOSIS — E785 Hyperlipidemia, unspecified: Secondary | ICD-10-CM | POA: Diagnosis not present

## 2021-07-30 DIAGNOSIS — I10 Essential (primary) hypertension: Secondary | ICD-10-CM | POA: Diagnosis not present

## 2021-07-30 DIAGNOSIS — Z23 Encounter for immunization: Secondary | ICD-10-CM | POA: Diagnosis not present

## 2021-07-30 DIAGNOSIS — I252 Old myocardial infarction: Secondary | ICD-10-CM | POA: Diagnosis not present

## 2021-07-30 DIAGNOSIS — I209 Angina pectoris, unspecified: Secondary | ICD-10-CM

## 2021-07-30 NOTE — Progress Notes (Addendum)
BP 116/60   Pulse 72   Temp (!) 97.5 F (36.4 C) (Oral)   Resp 16   Ht 5\' 8"  (1.727 m)   Wt 167 lb 12.8 oz (76.1 kg)   SpO2 98%   BMI 25.51 kg/m    Subjective:    Patient ID: Maurice Fischer, male    DOB: 30-Jul-1951, 70 y.o.   MRN: [id number]  HPI: Maurice Fischer is a 70 y.o. male, here alone  Chief Complaint  Patient presents with   Hypertension   Hyperlipidemia   Diabetes   HTN: He checks his blood pressure at home and it has been around 116/65.  He takes carvedilol 12.5 mg daily.  He denies any chest pain or shortness of breath  Hyperlipidemia:  Last LDL was 60 in 05/2020, will get labs today.  He is currently taking atorvastatin 40 mg daily.  He says that he occasionally has muscle cramps in his legs.  He says it is much less than when he was on other statins.    DM: He says when he checks his blood glucose it ranges  from 140- 160, he says he does not check it that often.  He is currently taking metformin 1000 mg BID, Jardiance 25 mg daily, Januvia 100 mg daily. He denies any polydipsia, polyphagia or polyuria.  Foot exam is up to date.   He will be getting his eye exam soon. Will get labs today  History of MI/ angina:  He says that he has not had chest pain for awhile.  He has an extensive cardiac history.  Last cardiac cath was done 06/17/21.    Prox RCA to Mid RCA lesion is 100% stenosed.   1st Diag lesion is 30% stenosed.   There is severe left ventricular systolic dysfunction.   The left ventricular ejection fraction is 30-35% by visual estimate.   There is no aortic valve stenosis.  Vitamin D deficiency:  Will get vitamin D level.  He currently takes Vitamin D.  Relevant past medical, surgical, family and social history reviewed and updated as indicated. Interim medical history since our last visit reviewed. Allergies and medications reviewed and updated.  Review of Systems  Constitutional: Negative for fever or weight change.  Respiratory: Negative for  cough and shortness of breath.   Cardiovascular: Negative for chest pain or palpitations.  Gastrointestinal: Negative for abdominal pain, no bowel changes.  Musculoskeletal: Negative for gait problem or joint swelling.  Skin: Negative for rash.  Neurological: Negative for dizziness or headache.  No other specific complaints in a complete review of systems (except as listed in HPI above).      Objective:    BP 116/60   Pulse 72   Temp (!) 97.5 F (36.4 C) (Oral)   Resp 16   Ht 5\' 8"  (1.727 m)   Wt 167 lb 12.8 oz (76.1 kg)   SpO2 98%   BMI 25.51 kg/m   Wt Readings from Last 3 Encounters:  07/30/21 167 lb 12.8 oz (76.1 kg)  06/17/21 168 lb (76.2 kg)  02/19/21 172 lb 14.4 oz (78.4 kg)    Physical Exam  Constitutional: Patient appears well-developed and well-nourished. No distress.  HEENT: head atraumatic, normocephalic, pupils equal and reactive to light,neck supple Cardiovascular: Normal rate, regular rhythm and normal heart sounds.  No murmur heard. No BLE edema. Pulmonary/Chest: Effort normal and breath sounds normal. No respiratory distress. Abdominal: Soft.  There is no tenderness. Psychiatric: Patient has a normal mood  and affect. behavior is normal. Judgment and thought content normal.   Results for orders placed or performed during the hospital encounter of 06/17/21  Glucose, capillary  Result Value Ref Range   Glucose-Capillary 122 (H) 70 - 99 mg/dL      Assessment & Plan:   1. Essential hypertension  - CBC with Differential/Platelet - COMPLETE METABOLIC PANEL WITH GFR  2. Hyperlipidemia LDL goal <70  - Lipid panel  3. 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 History of MI (myocardial infarction)   5. Angina pectoris ([place])   6. Vitamin D deficiency  - VITAMIN D 25 Hydroxy (Vit-D Deficiency, Fractures)  7. Need for influenza vaccination  - Flu  Vaccine QUAD High Dose(Fluad)  Follow up plan: Return in about 4 months (around 11/30/2021) for follow up.

## 2021-07-31 LAB — COMPLETE METABOLIC PANEL WITH GFR
AG Ratio: 2 (calc) (ref 1.0–2.5)
ALT: 27 U/L (ref 9–46)
AST: 20 U/L (ref 10–35)
Albumin: 4.6 g/dL (ref 3.6–5.1)
Alkaline phosphatase (APISO): 48 U/L (ref 35–144)
BUN: 22 mg/dL (ref 7–25)
CO2: 31 mmol/L (ref 20–32)
Calcium: 10.2 mg/dL (ref 8.6–10.3)
Chloride: 98 mmol/L (ref 98–110)
Creat: 0.85 mg/dL (ref 0.70–1.28)
Globulin: 2.3 g/dL (calc) (ref 1.9–3.7)
Glucose, Bld: 170 mg/dL — ABNORMAL HIGH (ref 65–99)
Potassium: 4.4 mmol/L (ref 3.5–5.3)
Sodium: 136 mmol/L (ref 135–146)
Total Bilirubin: 1.1 mg/dL (ref 0.2–1.2)
Total Protein: 6.9 g/dL (ref 6.1–8.1)
eGFR: 93 mL/min/{1.73_m2} (ref >=60)

## 2021-07-31 LAB — CBC WITH DIFFERENTIAL/PLATELET
Absolute Monocytes: 686 cells/uL (ref 200–950)
Basophils Absolute: 62 cells/uL (ref 0–200)
Basophils Relative: 0.8 %
Eosinophils Absolute: 421 cells/uL (ref 15–500)
Eosinophils Relative: 5.4 %
HCT: 46.3 % (ref 38.5–50.0)
Hemoglobin: 15.5 g/dL (ref 13.2–17.1)
Lymphs Abs: 2293 cells/uL (ref 850–3900)
MCH: 30.1 pg (ref 27.0–33.0)
MCHC: 33.5 g/dL (ref 32.0–36.0)
MCV: 89.9 fL (ref 80.0–100.0)
MPV: 9 fL (ref 7.5–12.5)
Monocytes Relative: 8.8 %
Neutro Abs: 4337 cells/uL (ref 1500–7800)
Neutrophils Relative %: 55.6 %
Platelets: 233 10*3/uL (ref 140–400)
RBC: 5.15 10*6/uL (ref 4.20–5.80)
RDW: 12.1 % (ref 11.0–15.0)
Total Lymphocyte: 29.4 %
WBC: 7.8 10*3/uL (ref 3.8–10.8)

## 2021-07-31 LAB — HEMOGLOBIN A1C
Hgb A1c MFr Bld: 7.3 % of total Hgb — ABNORMAL HIGH (ref <5.7)
Mean Plasma Glucose: 163 mg/dL
eAG (mmol/L): 9 mmol/L

## 2021-07-31 LAB — VITAMIN D 25 HYDROXY (VIT D DEFICIENCY, FRACTURES): Vit D, 25-Hydroxy: 57 ng/mL (ref 30–100)

## 2021-07-31 LAB — LIPID PANEL
Cholesterol: 115 mg/dL (ref <200)
HDL: 49 mg/dL (ref >=40)
LDL Cholesterol (Calc): 46 mg/dL (calc)
Non-HDL Cholesterol (Calc): 66 mg/dL (calc) (ref <130)
Total CHOL/HDL Ratio: 2.3 (calc) (ref <5.0)
Triglycerides: 118 mg/dL (ref <150)

## 2021-08-02 ENCOUNTER — Telehealth: Payer: Self-pay

## 2021-08-02 NOTE — Telephone Encounter (Signed)
Patient notified of labs.   

## 2021-08-02 NOTE — Telephone Encounter (Signed)
Copied from [place] [id number]. Topic: General - Other >> Aug 02, 2021 11:54 AM Celene Kras wrote: Reason for CRM: Pt received a call from [facility] This morning regarding lab results. Attempted to get pt transferred and was advised by Orla Estrin that Bonnita Nasuti was at lunch. Advised pt that Bonnita Nasuti would reach back out. Please advise.

## 2021-08-17 ENCOUNTER — Ambulatory Visit: Payer: Medicare Other | Attending: Internal Medicine

## 2021-08-17 ENCOUNTER — Other Ambulatory Visit: Payer: Self-pay

## 2021-08-17 ENCOUNTER — Telehealth: Payer: Self-pay | Admitting: *Deleted

## 2021-08-17 DIAGNOSIS — Z23 Encounter for immunization: Secondary | ICD-10-CM

## 2021-08-17 MED ORDER — MODERNA COVID-19 BIVAL BOOSTER 50 MCG/0.5ML IM SUSP
INTRAMUSCULAR | 0 refills | Status: DC
  Filled 2021-08-17: qty 0.5, 1d supply, fill #0

## 2021-08-17 NOTE — Chronic Care Management (AMB) (Signed)
  Care Management   Note  08/17/2021 Name: Maurice Fischer MRN: [id number] DOB: 11/12/1950  Maurice Fischer is a 70 y.o. year old male who is a primary care patient of Bo Merino, FNP and is actively engaged with the care management team. I reached out to Drema Pry by phone today to assist with scheduling an initial visit with the Pharmacist  Follow up plan: Patient declines further follow up and engagement by the care management team. Appropriate care team members and provider have been notified via electronic communication.   Julian Hy, [facility]  Direct Dial: [phone]

## 2021-08-17 NOTE — Progress Notes (Signed)
   Covid-19 Vaccination Clinic  Name:  Maurice Fischer    MRN: [id number] DOB: May 22, 1951  08/17/2021  [name] was observed post Covid-19 immunization for 15 minutes without incident. He was provided with Vaccine Information Sheet and instruction to access the V-Safe system.   [name] was instructed to call 911 with any severe reactions post vaccine: Difficulty breathing  Swelling of face and throat  A fast heartbeat  A bad rash all over body  Dizziness and weakness   Immunizations Administered     Name Date Dose VIS Date Route   Moderna Covid-19 vaccine Bivalent Booster 08/17/2021  1:49 PM 0.5 mL 06/05/2021 Intramuscular   Manufacturer: Levan Hurst   Lot: [id number]   NDC: [place], PharmD, MBA Clinical Acute Care Pharmacist

## 2021-08-30 ENCOUNTER — Other Ambulatory Visit: Payer: Self-pay

## 2021-08-30 LAB — HM DIABETES EYE EXAM

## 2021-08-31 ENCOUNTER — Other Ambulatory Visit: Payer: Self-pay | Admitting: Nurse Practitioner

## 2021-08-31 ENCOUNTER — Other Ambulatory Visit: Payer: Self-pay

## 2021-08-31 ENCOUNTER — Telehealth: Payer: Self-pay

## 2021-08-31 ENCOUNTER — Ambulatory Visit (INDEPENDENT_AMBULATORY_CARE_PROVIDER_SITE_OTHER): Payer: Medicare Other

## 2021-08-31 VITALS — BP 134/68 | HR 78 | Temp 98.0°F | Resp 20 | Ht 68.0 in | Wt 169.3 lb

## 2021-08-31 DIAGNOSIS — Z0000 Encounter for general adult medical examination without abnormal findings: Secondary | ICD-10-CM

## 2021-08-31 DIAGNOSIS — E11618 Type 2 diabetes mellitus with other diabetic arthropathy: Secondary | ICD-10-CM

## 2021-08-31 MED ORDER — ONETOUCH ULTRA VI STRP
ORAL_STRIP | 12 refills | Status: DC
  Filled 2021-08-31: qty 100, 90d supply, fill #0
  Filled 2022-01-23: qty 100, 90d supply, fill #1
  Filled 2022-03-16 (×2): qty 100, 90d supply, fill #2

## 2021-08-31 NOTE — Telephone Encounter (Signed)
Pt seen today for AWV. Last rx for blood glucose test strips was sent to [facility] as a generic prescription and the pharmacy filled accu chek test strips and he ended up purchasing an accu chek meter. Pt requests new rx for one touch ultra test strips to be sent to the [facility] pharmacy as that is his preferred type of test strips and his meter is still in working condition. Thank you!

## 2021-08-31 NOTE — Progress Notes (Signed)
Subjective:   Maurice Fischer is a 70 y.o. male who presents for Medicare Annual/Subsequent preventive examination.    Review of Systems     Cardiac Risk Factors include: advanced age (>30men, >34 women);diabetes mellitus;dyslipidemia;male gender;hypertension     Objective:    Today's Vitals   08/31/21 1449  BP: 134/68  Pulse: 78  Resp: 20  Temp: 98 F (36.7 C)  TempSrc: Oral  SpO2: 96%  Weight: 169 lb 4.8 oz (76.8 kg)  Height: 5\' 8"  (1.727 m)   Body mass index is 25.74 kg/m.  Advanced Directives 06/17/2021 08/27/2020 07/25/2019 05/10/2018 06/27/2017 04/13/2017 03/28/2017  Does Patient Have a Medical Advance Directive? No No No No No No No  Would patient like information on creating a medical advance directive? No - Patient declined Yes (MAU/Ambulatory/Procedural Areas - Information given) Yes (MAU/Ambulatory/Procedural Areas - Information given) Yes (MAU/Ambulatory/Procedural Areas - Information given) - - -    Current Medications (verified) Outpatient Encounter Medications as of 08/31/2021  Medication Sig   aspirin 81 MG tablet Take 81 mg by mouth daily.   atorvastatin (LIPITOR) 40 MG tablet TAKE 1 TABLET (40 MG TOTAL) BY MOUTH DAILY.   carvedilol (COREG) 12.5 MG tablet Take 1 tablet (12.5 mg total) by mouth 2 (two) times daily with meals   Cholecalciferol (VITAMIN D3) 2000 units TABS Take 2,000 Units by mouth daily.   empagliflozin (JARDIANCE) 25 MG TABS tablet TAKE 1 TABLET BY MOUTH DAILY BEFORE BREAKFAST.   glucose blood test strip Use as directed to monitor FSBS once daily (DX E11.65)   isosorbide mononitrate (IMDUR) 30 MG 24 hr tablet Take 1 tablet (30 mg total) by mouth once daily   metFORMIN (GLUCOPHAGE) 1000 MG tablet TAKE 1 TABLET BY MOUTH TWICE DAILY WITH A MEAL   Multiple Vitamin (MULTIVITAMIN) tablet Take 1 tablet by mouth daily.   quinapril (ACCUPRIL) 10 MG tablet take one tablet by mouth 2 times a day   sitaGLIPtin (JANUVIA) 100 MG tablet TAKE 1 TABLET BY  MOUTH DAILY.   spironolactone (ALDACTONE) 25 MG tablet Take 0.5 tablets (12.5 mg total) by mouth once daily   tetrahydrozoline-zinc (VISINE-AC) 0.05-0.25 % ophthalmic solution Place 2 drops into both eyes daily as needed (eye irritation).   [DISCONTINUED] COVID-19 mRNA bivalent vaccine, Moderna, (MODERNA COVID-19 BIVAL BOOSTER) 50 MCG/0.5ML injection Inject into the muscle.   [DISCONTINUED] COVID-19 mRNA vaccine, Moderna, (MODERNA COVID-19 VACCINE) 100 MCG/0.5ML injection Inject into the muscle.   [DISCONTINUED] COVID-19 mRNA vaccine, Moderna, 100 MCG/0.5ML injection USE AS DIRECTED   No facility-administered encounter medications on file as of 08/31/2021.    Allergies (verified) Patient has no known allergies.   History: Past Medical History:  Diagnosis Date   Diabetes mellitus without complication ([place])    Diverticulosis 2004   Heart disease    Hyperlipidemia    Hypertension    Inguinal hernia 1610   right   Umbilical hernia 9604   Past Surgical History:  Procedure Laterality Date   blister cut open on right thumb   03/2017   COLONOSCOPY  2004   [place]?   HERNIA REPAIR  54/06/8118   umbilical hernia/ Dr Jamal Collin   HERNIA REPAIR  01/13/2014   right inguinal hernia repair/ Dr Jamal Collin   LEFT HEART CATH AND CORONARY ANGIOGRAPHY N/A 06/17/2021   Procedure: LEFT HEART CATH AND CORONARY ANGIOGRAPHY with Possible Intervention;  Surgeon: Yolonda Kida, MD;  Location: [facility] [facility];  Service: Cardiovascular;  Laterality: N/A;   TONSILLECTOMY  1957?   Family History  Problem Relation Age of Onset   Cancer Father        prostate   Coronary artery disease Mother    Healthy Sister    Depression Brother    Social History   Socioeconomic History   Marital status: Married    Spouse name: Declined   Number of children: 0   Years of education: some college   Highest education level: 12th grade  Occupational History   Occupation: Retired  Tobacco Use    Smoking status: Former    Packs/day: 2.00    Years: 20.00    Pack years: 40.00    Types: Cigarettes    Quit date: 10/08/1992    Years since quitting: 28.9   Smokeless tobacco: Never   Tobacco comments:    smoking cessation materials not required  Vaping Use   Vaping Use: Never used  Substance and Sexual Activity   Alcohol use: Yes    Alcohol/week: 7.0 standard drinks    Types: 7 Standard drinks or equivalent per week   Drug use: No   Sexual activity: Not Currently  Other Topics Concern   Not on file  Social History Narrative   Not on file   Social Determinants of Health   Financial Resource Strain: Low Risk    Difficulty of Paying Living Expenses: Not hard at all  Food Insecurity: No Food Insecurity   Worried About Charity fundraiser in the Last Year: Never true   [place] in the Last Year: Never true  Transportation Needs: No Transportation Needs   Lack of Transportation (Medical): No   Lack of Transportation (Non-Medical): No  Physical Activity: Insufficiently Active   Days of Exercise per Week: 2 days   Minutes of Exercise per Session: 60 min  Stress: No Stress Concern Present   Feeling of Stress : Not at all  Social Connections: Moderately Integrated   Frequency of Communication with Friends and Family: Twice a week   Frequency of Social Gatherings with Friends and Family: Twice a week   Attends Religious Services: Never   Marine scientist or Organizations: Yes   Attends Music therapist: 1 to 4 times per year   Marital Status: Married    Tobacco Counseling Counseling given: No Tobacco comments: smoking cessation materials not required   Clinical Intake:  Pre-visit preparation completed: Yes  Pain : No/denies pain     Nutritional Status: BMI 25 -29 Overweight Nutritional Risks: None Diabetes: Yes CBG done?: No Did pt. bring in CBG monitor from home?: No  How often do you need to have someone help you when you read  instructions, pamphlets, or other written materials from your doctor or pharmacy?: 1 - Never  Nutrition Risk Assessment:  Has the patient had any N/V/D within the last 2 months?  No  Does the patient have any non-healing wounds?  No  Has the patient had any unintentional weight loss or weight gain?  No   Diabetes:  Is the patient diabetic?  Yes  If diabetic, was a CBG obtained today?  No  Did the patient bring in their glucometer from home?  No  How often do you monitor your CBG's? Every other day.   Financial Strains and Diabetes Management:  Are you having any financial strains with the device, your supplies or your medication? No .  Does the patient want to be seen by Chronic Care Management for management of their  diabetes?  No  Would the patient like to be referred to a Nutritionist or for Diabetic Management?  No   Diabetic Exams:  Diabetic Eye Exam: Completed 08/30/2021 negative retinopathy  Diabetic Foot Exam: Completed 02/19/2021.  Interpreter Needed?: No  Information entered by :: Kirke Shaggy, LPN   Activities of Daily Living In your present state of health, do you have any difficulty performing the following activities: 08/31/2021 07/30/2021  Hearing? N N  Vision? N N  Difficulty concentrating or making decisions? N N  Walking or climbing stairs? N N  Dressing or bathing? N N  Doing errands, shopping? N N  Preparing Food and eating ? N -  Using the Toilet? N -  In the past six months, have you accidently leaked urine? N -  Do you have problems with loss of bowel control? N -  Managing your Medications? N -  Managing your Finances? N -  Housekeeping or managing your Housekeeping? N -  Some recent data might be hidden    Patient Care Team: Bo Merino, FNP as PCP - General (Nurse Practitioner) Yolonda Kida, MD as Consulting Physician (Cardiology)  Indicate any recent Medical Services you may have received from other than [facility] providers in the past  year (date may be approximate).     Assessment:   This is a routine wellness examination for Maurice Fischer.  Hearing/Vision screen Hearing Screening - Comments:: States hears well Vision Screening - Comments:: Eye exam [facility]  Dietary issues and exercise activities discussed: Current Exercise Habits: Home exercise routine, Type of exercise: strength training/weights;walking;Other - see comments (golf), Time (Minutes): 60, Frequency (Times/Week): 2, Weekly Exercise (Minutes/Week): 120, Intensity: Moderate, Exercise limited by: None identified   Goals Addressed   None    Depression Screen PHQ 2/9 Scores 08/31/2021 07/30/2021 02/19/2021 10/06/2020 08/27/2020 05/15/2020 01/07/2020  PHQ - 2 Score 0 0 0 0 0 0 0  PHQ- 9 Score 0 0 0 - - 0 1    Fall Risk Fall Risk  08/31/2021 07/30/2021 02/19/2021 10/06/2020 08/27/2020  Falls in the past year? 0 0 0 0 0  Number falls in past yr: 0 0 0 0 0  Injury with Fall? 0 0 0 0 0  Risk for fall due to : No Fall Risks - - - No Fall Risks  Risk for fall due to: Comment - - - - -  Follow up Falls prevention discussed - - - Falls prevention discussed    FALL RISK PREVENTION PERTAINING TO THE HOME:  Any stairs in or around the home? Yes  If so, are there any without handrails? Yes  Home free of loose throw rugs in walkways, pet beds, electrical cords, etc? No  Adequate lighting in your home to reduce risk of falls? Yes   ASSISTIVE DEVICES UTILIZED TO PREVENT FALLS:  Life alert? No  Use of a cane, walker or w/c? No  Grab bars in the bathroom? Yes  Shower chair or bench in shower? No  Elevated toilet seat or a handicapped toilet? Yes   TIMED UP AND GO:  Was the test performed? Yes .  Length of time to ambulate 10 feet: 5 sec.   Gait steady and fast without use of assistive device  Cognitive Function:  Normal cognitive status assessed by direct observation by this Nurse Health Advisor. No abnormalities found.     6CIT Screen  07/25/2019 05/10/2018  What Year? 0 points 0 points  What month? 0 points  0 points  What time? 0 points 0 points  Count back from 20 0 points 0 points  Months in reverse 0 points 0 points  Repeat phrase 0 points 0 points  Total Score 0 0    Immunizations Immunization History  Administered Date(s) Administered   Fluad Quad(high Dose 65+) 07/02/2019, 07/15/2020, 07/30/2021   Influenza Split 08/16/2007   Influenza, High Dose Seasonal PF 07/22/2015, 07/06/2016, 06/27/2017, 07/09/2018   Influenza,inj,Quad PF,6+ Mos 06/26/2014   Influenza-Unspecified 07/02/2019   Moderna Covid-19 Vaccine Bivalent Booster 60yrs & up 08/17/2021   Moderna SARS-COV2 Booster Vaccination 09/11/2020, 02/05/2021   PFIZER(Purple Top)SARS-COV-2 Vaccination 12/15/2019, 01/07/2020   Pneumococcal Conjugate-13 10/08/2014   Pneumococcal Polysaccharide-23 10/08/2014, 05/10/2018   Td 03/04/2013   Zoster Recombinat (Shingrix) 08/17/2018, 12/18/2018   Zoster, Live 06/28/2011    TDAP status: Up to date  Flu Vaccine status: Up to date  Pneumococcal vaccine status: Up to date  Covid-19 vaccine status: Completed vaccines  Qualifies for Shingles Vaccine? Yes   Zostavax completed Yes   Shingrix Completed?: Yes  Screening Tests Health Maintenance  Topic Date Due   Pneumonia Vaccine 48+ Years old (3 - PCV) 05/11/2019   COVID-19 Vaccine (3 - Pfizer risk series) 08/17/2021   OPHTHALMOLOGY EXAM  08/25/2021   HEMOGLOBIN A1C  01/28/2022   FOOT EXAM  02/19/2022   TETANUS/TDAP  03/05/2023   COLONOSCOPY (Pts 45-17yrs Insurance coverage will need to be confirmed)  01/08/2024   INFLUENZA VACCINE  Completed   Hepatitis C Screening  Completed   Zoster Vaccines- Shingrix  Completed   HPV VACCINES  Aged Out    Health Maintenance  Health Maintenance Due  Topic Date Due   Pneumonia Vaccine 42+ Years old (3 - PCV) 05/11/2019   COVID-19 Vaccine (3 - Pfizer risk series) 08/17/2021   OPHTHALMOLOGY EXAM  08/25/2021     Colorectal cancer screening: Type of screening: Colonoscopy. Completed 01/07/2014. Repeat every 10 years  Lung Cancer Screening: (Low Dose CT Chest recommended if Age 44-80 years, 30 pack-year currently smoking OR have quit w/in 15years.) does not qualify.      Additional Screening:  Hepatitis C Screening: does qualify; Completed 02/23/2017  Vision Screening: Recommended annual ophthalmology exams for early detection of glaucoma and other disorders of the eye. Is the patient up to date with their annual eye exam?  Yes  Who is the provider or what is the name of the office in which the patient attends annual eye exams? [place] Screening: Recommended annual dental exams for proper oral hygiene  Community Resource Referral / Chronic Care Management: CRR required this visit?  No   CCM required this visit?  No      Plan:     I have personally reviewed and noted the following in the patient's chart:   Medical and social history Use of alcohol, tobacco or illicit drugs  Current medications and supplements including opioid prescriptions. Patient is not currently taking opioid prescriptions. Functional ability and status Nutritional status Physical activity Advanced directives List of other physicians Hospitalizations, surgeries, and ER visits in previous 12 months Vitals Screenings to include cognitive, depression, and falls Referrals and appointments  In addition, I have reviewed and discussed with patient certain preventive protocols, quality metrics, and best practice recommendations. A written personalized care plan for preventive services as well as general preventive health recommendations were provided to patient.     Dionisio David   08/31/2021   Nurse Notes: none

## 2021-08-31 NOTE — Patient Instructions (Signed)
Maurice Fischer , Thank you for taking time to come for your Medicare Wellness Visit. I appreciate your ongoing commitment to your health goals. Please review the following plan we discussed and let me know if I can assist you in the future.   Screening recommendations/referrals: Colonoscopy: up to date Recommended yearly ophthalmology/optometry visit for glaucoma screening and checkup Recommended yearly dental visit for hygiene and checkup  Vaccinations: Influenza vaccine: 07/30/2021 Pneumococcal vaccine: 05/10/2018 Tdap vaccine: 03/04/2013 Shingles vaccine: up to date   Covid-19: 12/14/29, 01/07/2020, 09/11/2020, 02/05/21, 08/17/21  Advanced directives: no, paperwork given  Conditions/risks identified:   Next appointment: Follow up in one year for your annual wellness visit.   Preventive Care 25 Years and Older, Male Preventive care refers to lifestyle choices and visits with your health care provider that can promote health and wellness. What does preventive care include? A yearly physical exam. This is also called an annual well check. Dental exams once or twice a year. Routine eye exams. Ask your health care provider how often you should have your eyes checked. Personal lifestyle choices, including: Daily care of your teeth and gums. Regular physical activity. Eating a healthy diet. Avoiding tobacco and drug use. Limiting alcohol use. Practicing safe sex. Taking low doses of aspirin every day. Taking vitamin and mineral supplements as recommended by your health care provider. What happens during an annual well check? The services and screenings done by your health care provider during your annual well check will depend on your age, overall health, lifestyle risk factors, and family history of disease. Counseling  Your health care provider may ask you questions about your: Alcohol use. Tobacco use. Drug use. Emotional well-being. Home and relationship well-being. Sexual  activity. Eating habits. History of falls. Memory and ability to understand (cognition). Work and work Statistician. Screening  You may have the following tests or measurements: Height, weight, and BMI. Blood pressure. Lipid and cholesterol levels. These may be checked every 5 years, or more frequently if you are over 35 years old. Skin check. Lung cancer screening. You may have this screening every year starting at age 52 if you have a 30-pack-year history of smoking and currently smoke or have quit within the past 15 years. Fecal occult blood test (FOBT) of the stool. You may have this test every year starting at age 26. Flexible sigmoidoscopy or colonoscopy. You may have a sigmoidoscopy every 5 years or a colonoscopy every 10 years starting at age 36. Prostate cancer screening. Recommendations will vary depending on your family history and other risks. Hepatitis C blood test. Hepatitis B blood test. Sexually transmitted disease (STD) testing. Diabetes screening. This is done by checking your blood sugar (glucose) after you have not eaten for a while (fasting). You may have this done every 1-3 years. Abdominal aortic aneurysm (AAA) screening. You may need this if you are a current or former smoker. Osteoporosis. You may be screened starting at age 40 if you are at high risk. Talk with your health care provider about your test results, treatment options, and if necessary, the need for more tests. Vaccines  Your health care provider may recommend certain vaccines, such as: Influenza vaccine. This is recommended every year. Tetanus, diphtheria, and acellular pertussis (Tdap, Td) vaccine. You may need a Td booster every 10 years. Zoster vaccine. You may need this after age 73. Pneumococcal 13-valent conjugate (PCV13) vaccine. One dose is recommended after age 76. Pneumococcal polysaccharide (PPSV23) vaccine. One dose is recommended after age 78. Talk to  your health care provider about which  screenings and vaccines you need and how often you need them. This information is not intended to replace advice given to you by your health care provider. Make sure you discuss any questions you have with your health care provider. Document Released: 11/06/2015 Document Revised: 06/29/2016 Document Reviewed: 08/11/2015 Elsevier Interactive Patient Education  2017 [place] Prevention in the Home Falls can cause injuries. They can happen to people of all ages. There are many things you can do to make your home safe and to help prevent falls. What can I do on the outside of my home? Regularly fix the edges of walkways and driveways and fix any cracks. Remove anything that might make you trip as you walk through a door, such as a raised step or threshold. Trim any bushes or trees on the path to your home. Use bright outdoor lighting. Clear any walking paths of anything that might make someone trip, such as rocks or tools. Regularly check to see if handrails are loose or broken. Make sure that both sides of any steps have handrails. Any raised decks and porches should have guardrails on the edges. Have any leaves, snow, or ice cleared regularly. Use sand or salt on walking paths during winter. Clean up any spills in your garage right away. This includes oil or grease spills. What can I do in the bathroom? Use night lights. Install grab bars by the toilet and in the tub and shower. Do not use towel bars as grab bars. Use non-skid mats or decals in the tub or shower. If you need to sit down in the shower, use a plastic, non-slip stool. Keep the floor dry. Clean up any water that spills on the floor as soon as it happens. Remove soap buildup in the tub or shower regularly. Attach bath mats securely with double-sided non-slip rug tape. Do not have throw rugs and other things on the floor that can make you trip. What can I do in the bedroom? Use night lights. Make sure that you have a  light by your bed that is easy to reach. Do not use any sheets or blankets that are too big for your bed. They should not hang down onto the floor. Have a firm chair that has side arms. You can use this for support while you get dressed. Do not have throw rugs and other things on the floor that can make you trip. What can I do in the kitchen? Clean up any spills right away. Avoid walking on wet floors. Keep items that you use a lot in easy-to-reach places. If you need to reach something above you, use a strong step stool that has a grab bar. Keep electrical cords out of the way. Do not use floor polish or wax that makes floors slippery. If you must use wax, use non-skid floor wax. Do not have throw rugs and other things on the floor that can make you trip. What can I do with my stairs? Do not leave any items on the stairs. Make sure that there are handrails on both sides of the stairs and use them. Fix handrails that are broken or loose. Make sure that handrails are as long as the stairways. Check any carpeting to make sure that it is firmly attached to the stairs. Fix any carpet that is loose or worn. Avoid having throw rugs at the top or bottom of the stairs. If you do have throw rugs, attach  them to the floor with carpet tape. Make sure that you have a light switch at the top of the stairs and the bottom of the stairs. If you do not have them, ask someone to add them for you. What else can I do to help prevent falls? Wear shoes that: Do not have high heels. Have rubber bottoms. Are comfortable and fit you well. Are closed at the toe. Do not wear sandals. If you use a stepladder: Make sure that it is fully opened. Do not climb a closed stepladder. Make sure that both sides of the stepladder are locked into place. Ask someone to hold it for you, if possible. Clearly mark and make sure that you can see: Any grab bars or handrails. First and last steps. Where the edge of each step  is. Use tools that help you move around (mobility aids) if they are needed. These include: Canes. Walkers. Scooters. Crutches. Turn on the lights when you go into a dark area. Replace any light bulbs as soon as they burn out. Set up your furniture so you have a clear path. Avoid moving your furniture around. If any of your floors are uneven, fix them. If there are any pets around you, be aware of where they are. Review your medicines with your doctor. Some medicines can make you feel dizzy. This can increase your chance of falling. Ask your doctor what other things that you can do to help prevent falls. This information is not intended to replace advice given to you by your health care provider. Make sure you discuss any questions you have with your health care provider. Document Released: 08/06/2009 Document Revised: 03/17/2016 Document Reviewed: 11/14/2014 Elsevier Interactive Patient Education  2017 [employer].

## 2021-09-02 ENCOUNTER — Other Ambulatory Visit: Payer: Self-pay

## 2021-09-06 ENCOUNTER — Other Ambulatory Visit: Payer: Self-pay

## 2021-09-14 ENCOUNTER — Other Ambulatory Visit: Payer: Self-pay | Admitting: Family Medicine

## 2021-09-14 ENCOUNTER — Other Ambulatory Visit: Payer: Self-pay

## 2021-09-14 DIAGNOSIS — E1165 Type 2 diabetes mellitus with hyperglycemia: Secondary | ICD-10-CM

## 2021-09-14 MED ORDER — EMPAGLIFLOZIN 25 MG PO TABS
ORAL_TABLET | Freq: Every day | ORAL | 0 refills | Status: DC
Start: 2021-09-14 — End: 2021-12-01
  Filled 2021-09-14: qty 90, 90d supply, fill #0

## 2021-09-14 NOTE — Telephone Encounter (Signed)
Requested Prescriptions  Pending Prescriptions Disp Refills  . empagliflozin (JARDIANCE) 25 MG TABS tablet 90 tablet 0    Sig: TAKE 1 TABLET BY MOUTH DAILY BEFORE BREAKFAST.     Endocrinology:  Diabetes - SGLT2 Inhibitors Passed - 09/14/2021  6:59 AM      Passed - Cr in normal range and within 360 days    Creat  Date Value Ref Range Status  07/30/2021 0.85 0.70 - 1.28 mg/dL Final   Creatinine, Urine  Date Value Ref Range Status  10/11/2018 94 20 - 320 mg/dL Final         Passed - LDL in normal range and within 360 days    LDL Cholesterol (Calc)  Date Value Ref Range Status  07/30/2021 46 mg/dL (calc) Final    Comment:    Reference range: <100 . Desirable range <100 mg/dL for primary prevention;   <70 mg/dL for patients with CHD or diabetic patients  with > or = 2 CHD risk factors. [name] LDL-C is now calculated using the Martin-Hopkins  calculation, which is a validated novel method providing  better accuracy than the Friedewald equation in the  estimation of LDL-C.  Cresenciano Genre et al. Annamaria Helling. 5927;639(43): 2061-2068  (http://education.[facility].com/faq/FAQ164)          Passed - HBA1C is between 0 and 7.9 and within 180 days    Hemoglobin A1C  Date Value Ref Range Status  05/14/2019 8.4  Final   HbA1c, POC (prediabetic range)  Date Value Ref Range Status  09/09/2019 6.3 5.7 - 6.4 % Final   Hgb A1c MFr Bld  Date Value Ref Range Status  07/30/2021 7.3 (H) <5.7 % of total Hgb Final    Comment:    For someone without known diabetes, a hemoglobin A1c value of 6.5% or greater indicates that they may have  diabetes and this should be confirmed with a follow-up  test. . For someone with known diabetes, a value <7% indicates  that their diabetes is well controlled and a value  greater than or equal to 7% indicates suboptimal  control. A1c targets should be individualized based on  duration of diabetes, age, comorbid conditions, and  other  considerations. . Currently, no consensus exists regarding use of hemoglobin A1c for diagnosis of diabetes for children. .          Passed - eGFR in normal range and within 360 days    GFR, Est African American  Date Value Ref Range Status  02/19/2021 102 > OR = 60 mL/min/1.67m Final   GFR, Est Non African American  Date Value Ref Range Status  02/19/2021 88 > OR = 60 mL/min/1.761mFinal   eGFR  Date Value Ref Range Status  07/30/2021 93 > OR = 60 mL/min/1.7363minal    Comment:    The eGFR is based on the CKD-EPI 2021 equation. To calculate  the new eGFR from a previous Creatinine or Cystatin C result, go to https://www.kidney.org/professionals/ kdoqi/gfr%5Fcalculator          Passed - Valid encounter within last 6 months    Recent Outpatient Visits          1 month ago Essential hypertension   [facility]   6 months ago [facility]   [facility]   [facility]   1 year ago Essential hypertension   [facility]  [place], [place]   1 year ago Essential hypertension   [facility] Malkin, [place]      Future Appointments            In 2 months Reece Packer, Myna Hidalgo, [facility], [place]   In 11 months  [facility], [facility]

## 2021-09-20 ENCOUNTER — Encounter: Payer: Self-pay | Admitting: Nurse Practitioner

## 2021-09-22 ENCOUNTER — Other Ambulatory Visit: Payer: Self-pay

## 2021-10-14 ENCOUNTER — Other Ambulatory Visit: Payer: Self-pay

## 2021-10-20 ENCOUNTER — Other Ambulatory Visit: Payer: Self-pay

## 2021-10-27 ENCOUNTER — Other Ambulatory Visit: Payer: Self-pay

## 2021-10-27 MED ORDER — NITROGLYCERIN 0.4 MG SL SUBL
SUBLINGUAL_TABLET | SUBLINGUAL | 11 refills | Status: DC
  Filled 2021-10-27: qty 25, 30d supply, fill #0
  Filled 2021-12-20: qty 25, 30d supply, fill #1
  Filled 2022-06-06: qty 25, 30d supply, fill #2
  Filled 2022-08-05: qty 25, 30d supply, fill #3

## 2021-11-08 ENCOUNTER — Other Ambulatory Visit: Payer: Self-pay

## 2021-11-10 ENCOUNTER — Other Ambulatory Visit: Payer: Self-pay

## 2021-11-10 MED ORDER — LISINOPRIL 10 MG PO TABS
10.0000 mg | ORAL_TABLET | Freq: Every day | ORAL | 0 refills | Status: DC
  Filled 2021-11-10: qty 30, 30d supply, fill #0

## 2021-11-15 ENCOUNTER — Other Ambulatory Visit: Payer: Self-pay

## 2021-11-19 ENCOUNTER — Other Ambulatory Visit: Payer: Self-pay

## 2021-11-24 ENCOUNTER — Other Ambulatory Visit: Payer: Self-pay

## 2021-11-29 ENCOUNTER — Other Ambulatory Visit (HOSPITAL_COMMUNITY): Payer: Self-pay

## 2021-12-01 ENCOUNTER — Ambulatory Visit (INDEPENDENT_AMBULATORY_CARE_PROVIDER_SITE_OTHER): Payer: Medicare Other | Admitting: Nurse Practitioner

## 2021-12-01 ENCOUNTER — Encounter: Payer: Self-pay | Admitting: Nurse Practitioner

## 2021-12-01 ENCOUNTER — Other Ambulatory Visit: Payer: Self-pay

## 2021-12-01 VITALS — BP 110/78 | HR 80 | Temp 98.3°F | Resp 16 | Ht 68.0 in | Wt 174.4 lb

## 2021-12-01 DIAGNOSIS — E785 Hyperlipidemia, unspecified: Secondary | ICD-10-CM | POA: Diagnosis not present

## 2021-12-01 DIAGNOSIS — I252 Old myocardial infarction: Secondary | ICD-10-CM | POA: Diagnosis not present

## 2021-12-01 DIAGNOSIS — I10 Essential (primary) hypertension: Secondary | ICD-10-CM | POA: Diagnosis not present

## 2021-12-01 DIAGNOSIS — E559 Vitamin D deficiency, unspecified: Secondary | ICD-10-CM

## 2021-12-01 DIAGNOSIS — E11618 Type 2 diabetes mellitus with other diabetic arthropathy: Secondary | ICD-10-CM | POA: Diagnosis not present

## 2021-12-01 MED ORDER — METFORMIN HCL 1000 MG PO TABS
ORAL_TABLET | Freq: Two times a day (BID) | ORAL | 3 refills | Status: DC
  Filled 2021-12-01: qty 180, fill #0
  Filled 2022-02-21: qty 180, 90d supply, fill #0
  Filled 2022-06-06: qty 180, 90d supply, fill #1
  Filled 2022-09-01: qty 180, 90d supply, fill #2

## 2021-12-01 MED ORDER — EMPAGLIFLOZIN 25 MG PO TABS
ORAL_TABLET | Freq: Every day | ORAL | 1 refills | Status: DC
  Filled 2021-12-01: qty 90, 90d supply, fill #0
  Filled 2022-03-16: qty 90, 90d supply, fill #1

## 2021-12-01 NOTE — Progress Notes (Signed)
BP 110/78    Pulse 80    Temp 98.3 F (36.8 C) (Oral)    Resp 16    Ht 5\' 8"  (1.727 m)    Wt 174 lb 6.4 oz (79.1 kg)    SpO2 98%    BMI 26.52 kg/m    Subjective:    Patient ID: Maurice Fischer, male    DOB: 06-17-1951, 71 y.o.   MRN: [id number]  HPI: Maurice Fischer is a 71 y.o. male, here alone  Chief Complaint  Patient presents with   Hypertension   Hyperlipidemia   Diabetes    4 month follow up    HTN: He says that his blood pressure was 124/65 this morning.  He takes carvedilol 12.5 mg daily, spironolactone 12.5 mg daily, quinapril 10 mg BID. He says he has been doing pretty good. He says that he takes his medication every day. His blood pressure today is 110/78.  He denies any chest pain, shortness of breath, headache or blurred vision.    Hyperlipidemia:  His last LDL was 46 on 07/30/2021.  He says he takes his atorvastatin every day. He denies any myalgia.     DM: He says that when he checks his blood sugar it is around 130-150. He says he takes his medication like he is supposed to but has not been watching what he eats since the holidays. He is currently taking metformin 1000 mg BID, Jardiance 25 mg daily, Januvia 100 mg daily. He denies any polydipsia, polyphagia or polyuria.  Foot and eye exam are up to date.    History of MI/ angina: He says he has been doing okay but he has noticed that when he is working out on the treadmill he has periods of shortness of breath and his heart rate increases.  He said that his cardiologist is aware of this.  He does have cardiac history.  Last cardiac cath was done 06/17/21. Last echo was 11/15/2021. His next appointment with cardiology is in April.  Echo showed: SEVERE LV SYSTOLIC DYSFUNCTION NORMAL RIGHT VENTRICULAR SYSTOLIC FUNCTION NO VALVULAR STENOSIS MILD to MODERATE MR TRIVIAL TR MILD PR EF 25% Globally drepressed LVF Grade I diastolic disfunction   Vitamin D deficiency:  His last vitamin D level was 57 on 07/30/2021. He does  currently taking Vitamin D.  Will continue current treatment plan.   Relevant past medical, surgical, family and social history reviewed and updated as indicated. Interim medical history since our last visit reviewed. Allergies and medications reviewed and updated.  Review of Systems  Constitutional: Negative for fever or weight change.  Respiratory: Negative for cough, positive for  shortness of breath.   Cardiovascular: Negative for chest pain or palpitations.  Gastrointestinal: Negative for abdominal pain, no bowel changes.  Musculoskeletal: Negative for gait problem or joint swelling.  Skin: Negative for rash.  Neurological: Negative for dizziness or headache.  No other specific complaints in a complete review of systems (except as listed in HPI above).      Objective:    BP 110/78    Pulse 80    Temp 98.3 F (36.8 C) (Oral)    Resp 16    Ht 5\' 8"  (1.727 m)    Wt 174 lb 6.4 oz (79.1 kg)    SpO2 98%    BMI 26.52 kg/m   Wt Readings from Last 3 Encounters:  12/01/21 174 lb 6.4 oz (79.1 kg)  08/31/21 169 lb 4.8 oz (76.8 kg)  07/30/21 167  lb 12.8 oz (76.1 kg)    Physical Exam  Constitutional: Patient appears well-developed and well-nourished.  No distress.  HEENT: head atraumatic, normocephalic, pupils equal and reactive to light, neck supple Cardiovascular: Normal rate, regular rhythm and normal heart sounds.  No murmur heard. No BLE edema. Pulmonary/Chest: Effort normal and breath sounds normal. No respiratory distress. Abdominal: Soft.  There is no tenderness. Psychiatric: Patient has a normal mood and affect. behavior is normal. Judgment and thought content normal.   Results for orders placed or performed in visit on 09/20/21  HM DIABETES EYE EXAM  Result Value Ref Range   HM Diabetic Eye Exam No Retinopathy No Retinopathy      Assessment & Plan:   1. Essential hypertension -continue current treatment plan -will get labs next visit  2. Hyperlipidemia LDL goal  <70 -continue current treatment plan -will get labs next visit  3. 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  Dispense: 90 tablet; Refill: 1  4. History of MI (myocardial infarction) -keep appointment with cardiology  5. Vitamin D deficiency -continue taking vitamin d  Follow up plan: Return in about 4 months (around 03/31/2022) for follow up.

## 2021-12-05 ENCOUNTER — Other Ambulatory Visit: Payer: Self-pay

## 2021-12-06 ENCOUNTER — Other Ambulatory Visit (HOSPITAL_COMMUNITY): Payer: Self-pay

## 2021-12-06 ENCOUNTER — Other Ambulatory Visit: Payer: Self-pay

## 2021-12-06 MED ORDER — LISINOPRIL 10 MG PO TABS
10.0000 mg | ORAL_TABLET | Freq: Every day | ORAL | 0 refills | Status: DC
  Filled 2021-12-06: qty 90, 90d supply, fill #0

## 2021-12-06 MED ORDER — LISINOPRIL 10 MG PO TABS
10.0000 mg | ORAL_TABLET | Freq: Every day | ORAL | 2 refills | Status: DC
  Filled 2021-12-06: qty 30, 30d supply, fill #0

## 2021-12-07 ENCOUNTER — Other Ambulatory Visit (HOSPITAL_COMMUNITY): Payer: Self-pay

## 2021-12-15 ENCOUNTER — Other Ambulatory Visit: Payer: Self-pay

## 2021-12-20 ENCOUNTER — Other Ambulatory Visit: Payer: Self-pay

## 2021-12-20 MED ORDER — FUROSEMIDE 20 MG PO TABS
ORAL_TABLET | ORAL | 3 refills | Status: DC
  Filled 2021-12-20: qty 15, 30d supply, fill #0
  Filled 2022-01-19: qty 15, 30d supply, fill #1

## 2021-12-20 MED ORDER — ENTRESTO 24-26 MG PO TABS
1.0000 | ORAL_TABLET | Freq: Two times a day (BID) | ORAL | 5 refills | Status: DC
  Filled 2021-12-20: qty 60, 30d supply, fill #0
  Filled 2022-01-19: qty 60, 30d supply, fill #1
  Filled 2022-02-21: qty 60, 30d supply, fill #2
  Filled 2022-03-25: qty 60, 30d supply, fill #3
  Filled 2022-04-27: qty 60, 30d supply, fill #4
  Filled 2022-05-26: qty 60, 30d supply, fill #5

## 2022-01-13 ENCOUNTER — Other Ambulatory Visit: Payer: Self-pay

## 2022-01-19 ENCOUNTER — Other Ambulatory Visit: Payer: Self-pay

## 2022-01-24 ENCOUNTER — Other Ambulatory Visit: Payer: Self-pay

## 2022-01-25 ENCOUNTER — Other Ambulatory Visit: Payer: Self-pay

## 2022-01-26 ENCOUNTER — Other Ambulatory Visit: Payer: Self-pay

## 2022-01-26 MED ORDER — FUROSEMIDE 20 MG PO TABS
ORAL_TABLET | ORAL | 5 refills | Status: DC
  Filled 2022-01-26: qty 60, 60d supply, fill #0

## 2022-01-31 ENCOUNTER — Other Ambulatory Visit: Payer: Self-pay

## 2022-01-31 MED ORDER — CARESTART COVID-19 HOME TEST VI KIT
PACK | 0 refills | Status: DC
  Filled 2022-01-31: qty 2, 4d supply, fill #0

## 2022-02-02 ENCOUNTER — Other Ambulatory Visit: Payer: Self-pay

## 2022-02-02 ENCOUNTER — Telehealth (INDEPENDENT_AMBULATORY_CARE_PROVIDER_SITE_OTHER): Payer: Medicare Other | Admitting: Nurse Practitioner

## 2022-02-02 ENCOUNTER — Ambulatory Visit: Payer: Self-pay

## 2022-02-02 DIAGNOSIS — U071 COVID-19: Secondary | ICD-10-CM

## 2022-02-02 MED ORDER — NIRMATRELVIR/RITONAVIR (PAXLOVID)TABLET
3.0000 | ORAL_TABLET | Freq: Two times a day (BID) | ORAL | 0 refills | Status: AC
Start: 2022-02-02 — End: 2022-02-07
  Filled 2022-02-02: qty 30, 5d supply, fill #0

## 2022-02-02 NOTE — Progress Notes (Signed)
? ?Name: Maurice Fischer   MRN: [id number]    DOB: 11/25/1950   Date:02/02/2022 ? ?     Progress Note ? ?Subjective ? ?Chief Complaint ? ?Chief Complaint  ?Patient presents with  ? Covid Positive  ?  Positive this morning  ? ? ?I connected with  Drema Pry  on 02/02/22 at 11:40 AM EDT by a telephone enabled telemedicine application and verified that I am speaking with the correct person using two identifiers.  I discussed the limitations of evaluation and management by telemedicine and the availability of in person appointments. The patient expressed understanding and agreed to proceed with a virtual visit  Staff also discussed with the patient that there may be a patient responsible charge related to this service. ?Patient Location: Home ?Provider Location: [facility] ?Additional Individuals present: alone ? ?HPI ? ?Covid: He tested positive for Covid this morning.  He started having symptoms on Monday.  He says his symptoms are cough and runny nose. He denies any fever, or shortness of breath.  He is within the window of treatment.  Discussed Paxlovid treatment and side effects. He would like to do the treatment.  Discussed stopping atorvastatin while taking Paxlovid.  Discussed pushing fluids and getting rest.  He says that he has plenty of cough medication and cough drops.  He says he is doing pretty well.  Discussed quarantine recommendations and signs to watch for to seek emergency care.  ? ?Patient Active Problem List  ? Diagnosis Date Noted  ? Angina pectoris ([place]) 02/19/2021  ? Seasonal allergic rhinitis 09/09/2019  ? Coronary artery disease involving native coronary artery of native heart without angina pectoris 07/09/2018  ? Shoulder impingement 09/28/2016  ? DM (diabetes mellitus), type 2, uncontrolled 07/05/2016  ? Overweight (BMI 25.0-29.9) 04/04/2016  ? Hypertension 04/04/2016  ? Vitamin D deficiency 04/04/2016  ? Hyperlipidemia LDL goal <70 09/16/2015  ? Osteoarthritis 09/16/2015  ? Hay fever  05/28/2015  ? Hypertriglyceridemia 05/28/2015  ? Inguinal hernia 05/28/2015  ? Umbilical hernia 20/25/4270  ? Atherosclerosis of coronary artery 02/08/2007  ? LBP (low back pain) 02/08/2007  ? ? ?Social History  ? ?Tobacco Use  ? Smoking status: Former  ?  Packs/day: 2.00  ?  Years: 20.00  ?  Pack years: 40.00  ?  Types: Cigarettes  ?  Quit date: 10/08/1992  ?  Years since quitting: 29.3  ? Smokeless tobacco: Never  ? Tobacco comments:  ?  smoking cessation materials not required  ?Substance Use Topics  ? Alcohol use: Yes  ?  Alcohol/week: 7.0 standard drinks  ?  Types: 7 Standard drinks or equivalent per week  ? ? ? ?Current Outpatient Medications:  ?  aspirin 81 MG tablet, Take 81 mg by mouth daily., Disp: , Rfl:  ?  atorvastatin (LIPITOR) 40 MG tablet, TAKE 1 TABLET (40 MG TOTAL) BY MOUTH DAILY., Disp: 90 tablet, Rfl: 3 ?  carvedilol (COREG) 12.5 MG tablet, Take 1 tablet (12.5 mg total) by mouth 2 (two) times daily with meals, Disp: 90 tablet, Rfl: 7 ?  Cholecalciferol (VITAMIN D3) 2000 units TABS, Take 2,000 Units by mouth daily., Disp: , Rfl:  ?  empagliflozin (JARDIANCE) 25 MG TABS tablet, TAKE 1 TABLET BY MOUTH DAILY BEFORE BREAKFAST., Disp: 90 tablet, Rfl: 1 ?  furosemide (LASIX) 20 MG tablet, Take 1 tablet (20 mg total) by mouth once daily, Disp: 60 tablet, Rfl: 5 ?  metFORMIN (GLUCOPHAGE) 1000 MG tablet, TAKE 1 TABLET BY MOUTH TWICE DAILY  WITH A MEAL, Disp: 180 tablet, Rfl: 3 ?  Multiple Vitamin (MULTIVITAMIN) tablet, Take 1 tablet by mouth daily., Disp: , Rfl:  ?  nitroGLYCERIN (NITROSTAT) 0.4 MG SL tablet, Place 1 tablet (0.4 mg total) under the tongue every 5 (five) minutes as needed for chest pain. May take up to 3 doses., Disp: 25 tablet, Rfl: 11 ?  sacubitril-valsartan (ENTRESTO) 24-26 MG, Take 1 tablet by mouth every 12 (twelve) hours, Disp: 60 tablet, Rfl: 5 ?  sitaGLIPtin (JANUVIA) 100 MG tablet, TAKE 1 TABLET BY MOUTH DAILY., Disp: 90 tablet, Rfl: 3 ?  spironolactone (ALDACTONE) 25 MG tablet,  Take 0.5 tablets (12.5 mg total) by mouth once daily, Disp: 60 tablet, Rfl: 2 ?  tetrahydrozoline-zinc (VISINE-AC) 0.05-0.25 % ophthalmic solution, Place 2 drops into both eyes daily as needed (eye irritation)., Disp: , Rfl:  ?  glucose blood (ONETOUCH ULTRA) test strip, Use as instructed (Patient not taking: Reported on 12/01/2021), Disp: 100 each, Rfl: 12 ?  glucose blood test strip, Use as directed to monitor FSBS once daily (DX E11.65) (Patient not taking: Reported on 12/01/2021), Disp: 100 each, Rfl: 3 ?  isosorbide mononitrate (IMDUR) 30 MG 24 hr tablet, Take 1 tablet (30 mg total) by mouth once daily, Disp: 30 tablet, Rfl: 11 ?  lisinopril (ZESTRIL) 10 MG tablet, Take 1 tablet (10 mg total) by mouth once daily (Patient not taking: Reported on 02/02/2022), Disp: 30 tablet, Rfl: 2 ?  lisinopril (ZESTRIL) 10 MG tablet, Take 1 tablet (10 mg total) by mouth once daily (Patient not taking: Reported on 02/02/2022), Disp: 100 tablet, Rfl: 0 ?  quinapril (ACCUPRIL) 10 MG tablet, take one tablet by mouth 2 times a day (Patient not taking: Reported on 02/02/2022), Disp: 60 tablet, Rfl: 11 ? ?No Known Allergies ? ?I personally reviewed active problem list, medication list, allergies, notes from last encounter with the patient/caregiver today. ? ?ROS ? ?Constitutional: Negative for fever or weight change.  ?HEENT: positive for runny nose ?Respiratory: positive for cough and negative for shortness of breath.   ?Cardiovascular: Negative for chest pain or palpitations.  ?Gastrointestinal: Negative for abdominal pain, no bowel changes.  ?Musculoskeletal: Negative for gait problem or joint swelling.  ?Skin: Negative for rash.  ?Neurological: Negative for dizziness or headache.  ?No other specific complaints in a complete review of systems (except as listed in HPI above).  ? ?Objective ? ?Virtual encounter, vitals not obtained. ? ?There is no height or weight on file to calculate BMI. ? ?Nursing Note and Vital Signs  reviewed. ? ?Physical Exam ? ?Awake, alert and oriented, speaking in complete sentences ? ?No results found for this or any previous visit (from the past 72 hour(s)). ? ?Assessment & Plan ? ?1. COVID-19 ?-recommend taking vitamin c, d and zinc ?Pushing fluids and getting rest ?- nirmatrelvir/ritonavir EUA (PAXLOVID) 20 x 150 MG & 10 x '100MG'$  TABS; Take 3 tablets by mouth 2 (two) times daily for 5 days. (Take nirmatrelvir 150 mg two tablets twice daily for 5 days and ritonavir 100 mg one tablet twice daily for 5 days) Patient GFR is 83  Dispense: 30 tablet; Refill: 0  ? ?-Red flags and when to present for emergency care or RTC including fever >101.30F, chest pain, shortness of breath, new/worsening/un-resolving symptoms,  reviewed with patient at time of visit. Follow up and care instructions discussed and provided in AVS. ?- I discussed the assessment and treatment plan with the patient. The patient was provided an opportunity to ask questions and  all were answered. The patient agreed with the plan and demonstrated an understanding of the instructions. ? ?I provided 10 minutes of non-face-to-face time during this encounter. ? ?Bo Merino, FNP ? ?  ?

## 2022-02-02 NOTE — Telephone Encounter (Signed)
?  Chief Complaint: cough + covid ?Symptoms: cough (productive ?Frequency: preventing for pt sleeping.  ?Pertinent Negatives: Patient denies SOB, chills, fatigue, headache, headache, muscle pain or sore throat ?Disposition: '[]'$ ED /'[]'$ Urgent Care (no appt availability in office) / '[x]'$ Appointment(In office/virtual)/ '[]'$  [place] [facility]/ '[]'$ Home Care/ '[]'$ Refused Recommended Disposition /'[]'$ [place] [facility]/ '[]'$  Follow-up with PCP ?Additional Notes: FC to call pt to make appt for phone visit today for Paxlovid. ? ? ? ? ? ? ?Reason for Disposition ? [1] COVID-19 infection suspected by caller or triager AND [2] mild symptoms (cough, fever, or others) AND [3] negative COVID-19 rapid test ? ?Answer Assessment - Initial Assessment Questions ?1. COVID-19 DIAGNOSIS: "Who made your COVID-19 diagnosis?" "Was it confirmed by a positive lab test or self-test?" If not diagnosed by a doctor (or NP/PA), ask "Are there lots of cases (community spread) where you live?" Note: See public health department website, if unsure. ?    Self test ?2. COVID-19 EXPOSURE: "Was there any known exposure to COVID before the symptoms began?" CDC Definition of close contact: within 6 feet (2 meters) for a total of 15 minutes or more over a 24-hour period.  ?    sister ?3. ONSET: "When did the COVID-19 symptoms start?"  ?    Monday ?4. WORST SYMPTOM: "What is your worst symptom?" (e.g., cough, fever, shortness of breath, muscle aches) ?    cough ?5. COUGH: "Do you have a cough?" If Yes, ask: "How bad is the cough?"   ?    Yes- ?6. FEVER: "Do you have a fever?" If Yes, ask: "What is your temperature, how was it measured, and when did it start?" ?    No fever ?7. RESPIRATORY STATUS: "Describe your breathing?" (e.g., shortness of breath, wheezing, unable to speak)  ?    no ?8. BETTER-SAME-WORSE: "Are you getting better, staying the same or getting worse compared to yesterday?"  If getting worse, ask, "In what way?" ?    same ?9. HIGH RISK DISEASE:  "Do you have any chronic medical problems?" (e.g., asthma, heart or lung disease, weak immune system, obesity, etc.) ?    Heart diseas diabetic age ?10. VACCINE: "Have you had the COVID-19 vaccine?" If Yes, ask: "Which one, how many shots, when did you get it?" ?      yes ?11. BOOSTER: "Have you received your COVID-19 booster?" If Yes, ask: "Which one and when did you get it?" ?      yes ?12. PREGNANCY: "Is there any chance you are pregnant?" "When was your last menstrual period?" ?      ?13. OTHER SYMPTOMS: "Do you have any other symptoms?"  (e.g., chills, fatigue, headache, loss of smell or taste, muscle pain, sore throat) ?      no ?14. O2 SATURATION MONITOR:  "Do you use an oxygen saturation monitor (pulse oximeter) at home?" If Yes, ask "What is your reading (oxygen level) today?" "What is your usual oxygen saturation reading?" (e.g., 95%) ?      no ? ?Protocols used: Coronavirus (QASUO-15) Diagnosed or Suspected-A-AH ? ?

## 2022-02-09 ENCOUNTER — Other Ambulatory Visit: Payer: Self-pay

## 2022-02-09 MED ORDER — FUROSEMIDE 20 MG PO TABS
ORAL_TABLET | ORAL | 5 refills | Status: DC
  Filled 2022-02-09: qty 60, 60d supply, fill #0

## 2022-02-14 ENCOUNTER — Other Ambulatory Visit: Payer: Self-pay

## 2022-02-18 ENCOUNTER — Other Ambulatory Visit: Payer: Self-pay

## 2022-02-18 ENCOUNTER — Ambulatory Visit (INDEPENDENT_AMBULATORY_CARE_PROVIDER_SITE_OTHER): Payer: Medicare Other | Admitting: Nurse Practitioner

## 2022-02-18 ENCOUNTER — Encounter: Payer: Self-pay | Admitting: Nurse Practitioner

## 2022-02-18 VITALS — BP 122/70 | HR 86 | Temp 98.1°F | Resp 16 | Ht 68.0 in | Wt 169.9 lb

## 2022-02-18 DIAGNOSIS — R053 Chronic cough: Secondary | ICD-10-CM | POA: Diagnosis not present

## 2022-02-18 MED ORDER — BENZONATATE 100 MG PO CAPS
200.0000 mg | ORAL_CAPSULE | Freq: Three times a day (TID) | ORAL | 0 refills | Status: DC | PRN
  Filled 2022-02-18: qty 20, 4d supply, fill #0

## 2022-02-18 MED ORDER — ALBUTEROL SULFATE HFA 108 (90 BASE) MCG/ACT IN AERS
2.0000 | INHALATION_SPRAY | Freq: Four times a day (QID) | RESPIRATORY_TRACT | 0 refills | Status: DC | PRN
  Filled 2022-02-18: qty 8.5, 25d supply, fill #0

## 2022-02-18 MED ORDER — PREDNISONE 10 MG PO TABS
ORAL_TABLET | ORAL | 0 refills | Status: DC
  Filled 2022-02-18: qty 21, 6d supply, fill #0

## 2022-02-18 NOTE — Progress Notes (Signed)
? ?BP 122/70   Pulse 86   Temp 98.1 ?F (36.7 ?C) (Oral)   Resp 16   Ht '5\' 8"'$  (1.727 m)   Wt 169 lb 14.4 oz (77.1 kg)   SpO2 98%   BMI 25.83 kg/m?   ? ?Subjective:  ? ? Patient ID: Maurice Fischer, male    DOB: 17-Dec-1950, 70 y.o.   MRN: [id number] ? ?HPI: ?Maurice Fischer is a 71 y.o. male ? ?Chief Complaint  ?Patient presents with  ? Cough  ?  For 1 week, producing thick mucous, had covid 2 weeks ago,   ? ?Persistent cough: Patient was seen on 02/02/2022 and was diagnosed with COVID-19.  He was treated with Paxlovid.  Patient reports his cough got better, but about a week ago the cough got worse.  He says it is a productive cough.  He says at night he feels like he is wheezing.  He denies any fever or shortness of breath.  Discussed that it is not uncommon to continue to have a cough after having COVID.  He says it feels like when he has had bronchitis in the past.  We will treat with steroid pack, albuterol, Tessalon Perles.  Discussed with patient that if cough worsens, fever or shortness of breath will order chest x-ray.  He is agreeable to plan. ? ?Relevant past medical, surgical, family and social history reviewed and updated as indicated. Interim medical history since our last visit reviewed. ?Allergies and medications reviewed and updated. ? ?Review of Systems ? ?Constitutional: Negative for fever or weight change.  ?Respiratory: positive for cough and negative for shortness of breath.   ?Cardiovascular: Negative for chest pain or palpitations.  ?Gastrointestinal: Negative for abdominal pain, no bowel changes.  ?Musculoskeletal: Negative for gait problem or joint swelling.  ?Skin: Negative for rash.  ?Neurological: Negative for dizziness or headache.  ?No other specific complaints in a complete review of systems (except as listed in HPI above).  ? ?   ?Objective:  ?  ?BP 122/70   Pulse 86   Temp 98.1 ?F (36.7 ?C) (Oral)   Resp 16   Ht '5\' 8"'$  (1.727 m)   Wt 169 lb 14.4 oz (77.1 kg)   SpO2 98%    BMI 25.83 kg/m?   ?Wt Readings from Last 3 Encounters:  ?02/18/22 169 lb 14.4 oz (77.1 kg)  ?12/01/21 174 lb 6.4 oz (79.1 kg)  ?08/31/21 169 lb 4.8 oz (76.8 kg)  ?  ?Physical Exam ? ?Constitutional: Patient appears well-developed and well-nourished.  No distress.  ?HEENT: head atraumatic, normocephalic, pupils equal and reactive to light,  neck supple ?Cardiovascular: Normal rate, regular rhythm and normal heart sounds.  No murmur heard. No BLE edema. ?Pulmonary/Chest: Effort normal and breath sounds expiratory wheeze. No respiratory distress. ?Abdominal: Soft.  There is no tenderness. ?Psychiatric: Patient has a normal mood and affect. behavior is normal. Judgment and thought content normal.  ?Results for orders placed or performed in visit on 09/20/21  ?HM DIABETES EYE EXAM  ?Result Value Ref Range  ? HM Diabetic Eye Exam No Retinopathy No Retinopathy  ? ?   ?Assessment & Plan:  ? ?1. Persistent cough ? ?- albuterol (VENTOLIN HFA) 108 (90 Base) MCG/ACT inhaler; Inhale 2 puffs into the lungs every 6 (six) hours as needed for wheezing or shortness of breath.  Dispense: 8.5 g; Refill: 0 ?- predniSONE (DELTASONE) 10 MG tablet; Take 6 tabs daily for 2 days, 5 tabs daily for 2 days, 4 tabs  daily for 2 days, 3 tabs daily for 2 days, 2 tabs for 2 days, 1 tab daily for 2 days then stop.  Dispense: 21 tablet; Refill: 0 ?- benzonatate (TESSALON) 100 MG capsule; Take 2 capsules (200 mg total) by mouth 3 (three) times daily as needed for cough.  Dispense: 20 capsule; Refill: 0  ? ?Follow up plan: ?Return if symptoms worsen or fail to improve. ? ? ? ? ? ?

## 2022-02-21 ENCOUNTER — Other Ambulatory Visit: Payer: Self-pay

## 2022-03-09 ENCOUNTER — Other Ambulatory Visit: Payer: Self-pay

## 2022-03-16 ENCOUNTER — Other Ambulatory Visit: Payer: Self-pay

## 2022-03-16 ENCOUNTER — Other Ambulatory Visit: Payer: Self-pay | Admitting: Family Medicine

## 2022-03-16 DIAGNOSIS — E11618 Type 2 diabetes mellitus with other diabetic arthropathy: Secondary | ICD-10-CM

## 2022-03-16 MED ORDER — SITAGLIPTIN PHOSPHATE 100 MG PO TABS
ORAL_TABLET | Freq: Every day | ORAL | 3 refills | Status: DC
  Filled 2022-03-16: qty 90, 90d supply, fill #0
  Filled 2022-06-20: qty 90, 90d supply, fill #1
  Filled 2022-09-22: qty 90, 90d supply, fill #2
  Filled 2022-12-13: qty 90, 90d supply, fill #3

## 2022-03-18 ENCOUNTER — Other Ambulatory Visit: Payer: Self-pay

## 2022-03-18 ENCOUNTER — Telehealth: Payer: Self-pay

## 2022-03-18 ENCOUNTER — Other Ambulatory Visit: Payer: Self-pay | Admitting: Emergency Medicine

## 2022-03-18 DIAGNOSIS — E11618 Type 2 diabetes mellitus with other diabetic arthropathy: Secondary | ICD-10-CM

## 2022-03-18 MED ORDER — SITAGLIPTIN PHOSPHATE 100 MG PO TABS: ORAL_TABLET | Freq: Every day | ORAL | 3 refills | Status: DC

## 2022-03-18 NOTE — Telephone Encounter (Signed)
Pt is requesting a refill on Januvia to be sent to [facility]

## 2022-03-18 NOTE — Telephone Encounter (Signed)
Order sent to [facility] for refill 

## 2022-03-25 ENCOUNTER — Other Ambulatory Visit: Payer: Self-pay

## 2022-04-01 ENCOUNTER — Encounter: Payer: Self-pay | Admitting: Nurse Practitioner

## 2022-04-01 ENCOUNTER — Ambulatory Visit (INDEPENDENT_AMBULATORY_CARE_PROVIDER_SITE_OTHER): Payer: Medicare Other | Admitting: Nurse Practitioner

## 2022-04-01 ENCOUNTER — Other Ambulatory Visit: Payer: Self-pay

## 2022-04-01 VITALS — BP 124/70 | HR 74 | Temp 98.3°F | Resp 16 | Ht 68.0 in | Wt 171.1 lb

## 2022-04-01 DIAGNOSIS — I10 Essential (primary) hypertension: Secondary | ICD-10-CM

## 2022-04-01 DIAGNOSIS — J302 Other seasonal allergic rhinitis: Secondary | ICD-10-CM

## 2022-04-01 DIAGNOSIS — I209 Angina pectoris, unspecified: Secondary | ICD-10-CM

## 2022-04-01 DIAGNOSIS — I252 Old myocardial infarction: Secondary | ICD-10-CM | POA: Diagnosis not present

## 2022-04-01 DIAGNOSIS — E559 Vitamin D deficiency, unspecified: Secondary | ICD-10-CM

## 2022-04-01 DIAGNOSIS — E11618 Type 2 diabetes mellitus with other diabetic arthropathy: Secondary | ICD-10-CM

## 2022-04-01 DIAGNOSIS — E785 Hyperlipidemia, unspecified: Secondary | ICD-10-CM

## 2022-04-01 DIAGNOSIS — I2510 Atherosclerotic heart disease of native coronary artery without angina pectoris: Secondary | ICD-10-CM

## 2022-04-01 NOTE — Assessment & Plan Note (Signed)
Continue to follow instructions from cardiology.  Patient denies any recent chest pain.  Patient is considering defibrillator.

## 2022-04-01 NOTE — Assessment & Plan Note (Signed)
Taking vitamin D supplementation.  We will get labs today.

## 2022-04-01 NOTE — Assessment & Plan Note (Signed)
Last LDL was at goal on 07/30/2021.  Continue taking atorvastatin 40 mg daily.  We will get labs today.

## 2022-04-01 NOTE — Assessment & Plan Note (Signed)
Blood pressure at goal today 124/70.  Continue taking carvedilol 12.5 mg daily, spironolactone 12.5 mg daily and quinapril 10 mg twice daily.

## 2022-04-01 NOTE — Progress Notes (Signed)
BP 124/70   Pulse 74   Temp 98.3 F (36.8 C) (Oral)   Resp 16   Ht '5\' 8"'$  (1.727 m)   Wt 171 lb 1.6 oz (77.6 kg)   SpO2 97%   BMI 26.02 kg/m    Subjective:    Patient ID: Maurice Fischer, male    DOB: 1950-12-10, 71 y.o.   MRN: [id number]  HPI: Maurice Fischer is a 71 y.o. male  Chief Complaint  Patient presents with   Diabetes   Hypertension   Hyperlipidemia    4 month follow up   HTN: His blood pressure today is 124/70.  His blood pressure has been  102/55-110/60 home.  Patient denies any chest pain, shortness of breath, headaches or blurred vision.  He currently takes carvedilol 12.5 mg daily, spironolactone 12.5 mg daily, quinapril 10 mg twice daily.   Hyperlipidemia: Last LDL was 46 on 07/30/2021.  Is currently taking atorvastatin 40 mg daily.  He denies any myalgia.  We will get labs today.   DM: Patient reports he has not been checking his blood sugar regularly lately.  He says his blood sugar this morning was in the 180s.  He says he has not really been watching what he eats.  He is currently taking metformin at 1000 mg twice daily, Jardiance 25 mg daily, Januvia 100 mg daily.  He denies any polydipsia, polyphagia or polyuria he is due for his foot exam.     History of MI/ angina: He says he is discussing with Cardiology about getting a defibrillator.  He says he has not decided if he is going to do it yet.  They are just talking about it.  He denies having any chest pain or shortness of breath recently.  He has been doing pretty good.  Last cardiac cath was done 06/17/2021.  Last echo was done November 15, 2021.  He is currently taking Entresto 24-26 mg every 12 hours.  He also has a prescription for nitroglycerin 0.4 sublingual when he needs it.  His next appointment with cardiology is in April.  Last appointment with cardiology was 02/25/2022. Echo showed: SEVERE LV SYSTOLIC DYSFUNCTION NORMAL RIGHT VENTRICULAR SYSTOLIC FUNCTION NO VALVULAR STENOSIS MILD to MODERATE  MR TRIVIAL TR MILD PR EF 25% Globally drepressed LVF Grade I diastolic disfunction    Vitamin D deficiency: Has a history of low vitamin D.  His last vitamin D level was 57 on 07/30/2021.  He is currently taking vitamin D supplementation.  We will get labs today.  Relevant past medical, surgical, family and social history reviewed and updated as indicated. Interim medical history since our last visit reviewed. Allergies and medications reviewed and updated.  Review of Systems  Constitutional: Negative for fever or weight change.  Respiratory: Negative for cough and shortness of breath.   Cardiovascular: Negative for chest pain or palpitations.  Gastrointestinal: Negative for abdominal pain, no bowel changes.  Musculoskeletal: Negative for gait problem or joint swelling.  Skin: Negative for rash.  Neurological: Negative for dizziness or headache.  No other specific complaints in a complete review of systems (except as listed in HPI above).      Objective:    BP 124/70   Pulse 74   Temp 98.3 F (36.8 C) (Oral)   Resp 16   Ht '5\' 8"'$  (1.727 m)   Wt 171 lb 1.6 oz (77.6 kg)   SpO2 97%   BMI 26.02 kg/m   Wt Readings from Last 3 Encounters:  04/01/22 171 lb 1.6 oz (77.6 kg)  02/18/22 169 lb 14.4 oz (77.1 kg)  12/01/21 174 lb 6.4 oz (79.1 kg)    Physical Exam  Constitutional: Patient appears well-developed and well-nourished.  No distress.  HEENT: head atraumatic, normocephalic, pupils equal and reactive to light,  neck supple Cardiovascular: Normal rate, regular rhythm and normal heart sounds.  No murmur heard. No BLE edema. Pulmonary/Chest: Effort normal and breath sounds normal. No respiratory distress. Abdominal: Soft.  There is no tenderness. Psychiatric: Patient has a normal mood and affect. behavior is normal. Judgment and thought content normal.  Diabetic Foot Exam - Simple   Simple Foot Form Diabetic Foot exam was performed with the following findings: Yes 04/01/2022   8:30 AM  Visual Inspection No deformities, no ulcerations, no other skin breakdown bilaterally: Yes Sensation Testing Intact to touch and monofilament testing bilaterally: Yes Pulse Check Posterior Tibialis and Dorsalis pulse intact bilaterally: Yes Comments     Results for orders placed or performed in visit on 09/20/21  HM DIABETES EYE EXAM  Result Value Ref Range   HM Diabetic Eye Exam No Retinopathy No Retinopathy      Assessment & Plan:   Problem List Items Addressed This Visit       Cardiovascular and Mediastinum   Coronary artery disease involving native coronary artery of native heart without angina pectoris (Chronic)    Continue to follow instructions from cardiology.  Patient denies any recent chest pain.  Patient is considering defibrillator.      Essential hypertension - Primary    Blood pressure at goal today 124/70.  Continue taking carvedilol 12.5 mg daily, spironolactone 12.5 mg daily and quinapril 10 mg twice daily.      Relevant Orders   CBC with Differential/Platelet   COMPLETE METABOLIC PANEL WITH GFR   Angina pectoris ([place])     Respiratory   Seasonal allergic rhinitis    Continue to follow instructions from cardiology.  Patient denies any recent chest pain.  Patient is considering defibrillator.        Endocrine   Diabetes mellitus type II, controlled ([place])    Continue taking metformin at 1000 mg 2 times a day, Jardiance 25 mg daily and Januvia 100 mg daily.      Relevant Orders   Hemoglobin A1c   HM Diabetes Foot Exam (Completed)     Other   Hyperlipidemia LDL goal <70 (Chronic)    Last LDL was at goal on 07/30/2021.  Continue taking atorvastatin 40 mg daily.  We will get labs today.      Relevant Orders   Lipid panel   COMPLETE METABOLIC PANEL WITH GFR   Vitamin D deficiency    Taking vitamin D supplementation.  We will get labs today.      Relevant Orders   VITAMIN D 25 Hydroxy (Vit-D Deficiency, Fractures)   History of MI  (myocardial infarction)    Continue to follow instructions from cardiology.  Patient denies any recent chest pain.  Patient is considering defibrillator.        Follow up plan: Return in about 4 months (around 08/01/2022) for follow up.

## 2022-04-01 NOTE — Assessment & Plan Note (Signed)
Continue taking metformin at 1000 mg 2 times a day, Jardiance 25 mg daily and Januvia 100 mg daily.

## 2022-04-02 LAB — CBC WITH DIFFERENTIAL/PLATELET
Absolute Monocytes: 536 cells/uL (ref 200–950)
Basophils Absolute: 72 cells/uL (ref 0–200)
Basophils Relative: 0.9 %
Eosinophils Absolute: 336 cells/uL (ref 15–500)
Eosinophils Relative: 4.2 %
HCT: 43.3 % (ref 38.5–50.0)
Hemoglobin: 14.8 g/dL (ref 13.2–17.1)
Lymphs Abs: 2296 cells/uL (ref 850–3900)
MCH: 30.6 pg (ref 27.0–33.0)
MCHC: 34.2 g/dL (ref 32.0–36.0)
MCV: 89.5 fL (ref 80.0–100.0)
MPV: 9.3 fL (ref 7.5–12.5)
Monocytes Relative: 6.7 %
Neutro Abs: 4760 cells/uL (ref 1500–7800)
Neutrophils Relative %: 59.5 %
Platelets: 256 10*3/uL (ref 140–400)
RBC: 4.84 10*6/uL (ref 4.20–5.80)
RDW: 12.7 % (ref 11.0–15.0)
Total Lymphocyte: 28.7 %
WBC: 8 10*3/uL (ref 3.8–10.8)

## 2022-04-02 LAB — COMPLETE METABOLIC PANEL WITH GFR
AG Ratio: 1.9 (calc) (ref 1.0–2.5)
ALT: 22 U/L (ref 9–46)
AST: 16 U/L (ref 10–35)
Albumin: 4.3 g/dL (ref 3.6–5.1)
Alkaline phosphatase (APISO): 47 U/L (ref 35–144)
BUN: 20 mg/dL (ref 7–25)
CO2: 27 mmol/L (ref 20–32)
Calcium: 9.9 mg/dL (ref 8.6–10.3)
Chloride: 103 mmol/L (ref 98–110)
Creat: 0.83 mg/dL (ref 0.70–1.28)
Globulin: 2.3 g/dL (calc) (ref 1.9–3.7)
Glucose, Bld: 142 mg/dL — ABNORMAL HIGH (ref 65–99)
Potassium: 5.1 mmol/L (ref 3.5–5.3)
Sodium: 138 mmol/L (ref 135–146)
Total Bilirubin: 0.6 mg/dL (ref 0.2–1.2)
Total Protein: 6.6 g/dL (ref 6.1–8.1)
eGFR: 93 mL/min/{1.73_m2} (ref >=60)

## 2022-04-02 LAB — LIPID PANEL
Cholesterol: 128 mg/dL (ref <200)
HDL: 49 mg/dL (ref >=40)
LDL Cholesterol (Calc): 59 mg/dL (calc)
Non-HDL Cholesterol (Calc): 79 mg/dL (calc) (ref <130)
Total CHOL/HDL Ratio: 2.6 (calc) (ref <5.0)
Triglycerides: 117 mg/dL (ref <150)

## 2022-04-02 LAB — VITAMIN D 25 HYDROXY (VIT D DEFICIENCY, FRACTURES): Vit D, 25-Hydroxy: 56 ng/mL (ref 30–100)

## 2022-04-02 LAB — HEMOGLOBIN A1C
Hgb A1c MFr Bld: 7.5 % of total Hgb — ABNORMAL HIGH (ref <5.7)
Mean Plasma Glucose: 169 mg/dL
eAG (mmol/L): 9.3 mmol/L

## 2022-04-13 ENCOUNTER — Other Ambulatory Visit: Payer: Self-pay | Admitting: Nurse Practitioner

## 2022-04-13 ENCOUNTER — Other Ambulatory Visit: Payer: Self-pay

## 2022-04-13 DIAGNOSIS — E785 Hyperlipidemia, unspecified: Secondary | ICD-10-CM

## 2022-04-13 DIAGNOSIS — I2510 Atherosclerotic heart disease of native coronary artery without angina pectoris: Secondary | ICD-10-CM

## 2022-04-13 MED ORDER — ATORVASTATIN CALCIUM 40 MG PO TABS
ORAL_TABLET | Freq: Every day | ORAL | 3 refills | Status: DC
  Filled 2022-04-13: qty 90, 90d supply, fill #0
  Filled 2022-06-22: qty 90, 90d supply, fill #1
  Filled 2022-10-23: qty 90, 90d supply, fill #2
  Filled 2023-01-16: qty 90, 90d supply, fill #3

## 2022-04-13 NOTE — Telephone Encounter (Signed)
Requested Prescriptions  Pending Prescriptions Disp Refills  . atorvastatin (LIPITOR) 40 MG tablet 90 tablet 3    Sig: TAKE 1 TABLET (40 MG TOTAL) BY MOUTH DAILY.     Cardiovascular:  Antilipid - Statins Failed - 04/13/2022 10:46 AM      Failed - Lipid Panel in normal range within the last 12 months    Cholesterol, Total  Date Value Ref Range Status  09/16/2015 134 100 - 199 mg/dL Final   Cholesterol  Date Value Ref Range Status  04/01/2022 128 <200 mg/dL Final   LDL Cholesterol (Calc)  Date Value Ref Range Status  04/01/2022 59 mg/dL (calc) Final    Comment:    Reference range: <100 . Desirable range <100 mg/dL for primary prevention;   <70 mg/dL for patients with CHD or diabetic patients  with > or = 2 CHD risk factors. [name] LDL-C is now calculated using the Martin-Hopkins  calculation, which is a validated novel method providing  better accuracy than the Friedewald equation in the  estimation of LDL-C.  Cresenciano Genre et al. Annamaria Helling. 2725;366(44): 2061-2068  (http://education.[employer].com/faq/FAQ164)    HDL  Date Value Ref Range Status  04/01/2022 49 > OR = 40 mg/dL Final  09/16/2015 47 >39 mg/dL Final   Triglycerides  Date Value Ref Range Status  04/01/2022 117 <150 mg/dL Final         Passed - Patient is not pregnant      Passed - Valid encounter within last 12 months    Recent Outpatient Visits          1 week ago Essential hypertension   [place], FNP   1 month ago Persistent cough   [facility] Bo Merino, FNP   2 months ago COVID-19   [facility] Bo Merino, FNP   4 months ago Essential hypertension   [place], FNP   8 months ago Essential hypertension   [facility] Bo Merino, FNP      Future Appointments            In 3 months Reece Packer, Myna Hidalgo, [facility], [place]   In 4  months  [facility], [facility]

## 2022-04-27 ENCOUNTER — Other Ambulatory Visit: Payer: Self-pay

## 2022-05-26 ENCOUNTER — Other Ambulatory Visit: Payer: Self-pay

## 2022-06-06 ENCOUNTER — Other Ambulatory Visit: Payer: Self-pay

## 2022-06-13 ENCOUNTER — Other Ambulatory Visit: Payer: Self-pay

## 2022-06-13 ENCOUNTER — Other Ambulatory Visit: Payer: Self-pay | Admitting: Nurse Practitioner

## 2022-06-13 DIAGNOSIS — E11618 Type 2 diabetes mellitus with other diabetic arthropathy: Secondary | ICD-10-CM

## 2022-06-14 ENCOUNTER — Other Ambulatory Visit: Payer: Self-pay

## 2022-06-14 MED ORDER — EMPAGLIFLOZIN 25 MG PO TABS
ORAL_TABLET | Freq: Every day | ORAL | 1 refills | Status: DC
  Filled 2022-06-14: qty 90, 90d supply, fill #0
  Filled 2022-09-13: qty 90, 90d supply, fill #1

## 2022-06-14 NOTE — Telephone Encounter (Signed)
Requested Prescriptions  Pending Prescriptions Disp Refills  . empagliflozin (JARDIANCE) 25 MG TABS tablet 90 tablet 1    Sig: TAKE 1 TABLET BY MOUTH DAILY BEFORE BREAKFAST.     Endocrinology:  Diabetes - SGLT2 Inhibitors Passed - 06/13/2022  5:42 AM      Passed - Cr in normal range and within 360 days    Creat  Date Value Ref Range Status  04/01/2022 0.83 0.70 - 1.28 mg/dL Final   Creatinine, Urine  Date Value Ref Range Status  10/11/2018 94 20 - 320 mg/dL Final         Passed - HBA1C is between 0 and 7.9 and within 180 days    Hemoglobin A1C  Date Value Ref Range Status  05/14/2019 8.4  Final   HbA1c, POC (prediabetic range)  Date Value Ref Range Status  09/09/2019 6.3 5.7 - 6.4 % Final   Hgb A1c MFr Bld  Date Value Ref Range Status  04/01/2022 7.5 (H) <5.7 % of total Hgb Final    Comment:    For someone without known diabetes, a hemoglobin A1c value of 6.5% or greater indicates that they may have  diabetes and this should be confirmed with a follow-up  test. . For someone with known diabetes, a value <7% indicates  that their diabetes is well controlled and a value  greater than or equal to 7% indicates suboptimal  control. A1c targets should be individualized based on  duration of diabetes, age, comorbid conditions, and  other considerations. . Currently, no consensus exists regarding use of hemoglobin A1c for diagnosis of diabetes for children. .          Passed - eGFR in normal range and within 360 days    GFR, Est African American  Date Value Ref Range Status  02/19/2021 102 > OR = 60 mL/min/1.72m Final   GFR, Est Non African American  Date Value Ref Range Status  02/19/2021 88 > OR = 60 mL/min/1.792mFinal   eGFR  Date Value Ref Range Status  04/01/2022 93 > OR = 60 mL/min/1.7349minal    Comment:    The eGFR is based on the CKD-EPI 2021 equation. To calculate  the new eGFR from a previous Creatinine or Cystatin C result, go to  https://www.kidney.org/professionals/ kdoqi/gfr%5Fcalculator          Passed - Valid encounter within last 6 months    Recent Outpatient Visits          2 months ago Essential hypertension   [facility]   3 months ago [facility]istent cough   [facility]   [facility] months [facility]   6 months [facility]   10 months ago [facility]      Future Appointments            In 1 month [facility]  [facility]

## 2022-06-20 ENCOUNTER — Other Ambulatory Visit: Payer: Self-pay

## 2022-06-22 ENCOUNTER — Other Ambulatory Visit: Payer: Self-pay

## 2022-06-22 MED ORDER — SACUBITRIL-VALSARTAN 24-26 MG PO TABS
1.0000 | ORAL_TABLET | Freq: Two times a day (BID) | ORAL | 5 refills | Status: DC
Start: 2022-06-22 — End: 2022-12-28
  Filled 2022-06-22: qty 60, 30d supply, fill #0
  Filled 2022-07-29: qty 60, 30d supply, fill #1
  Filled 2022-09-01: qty 60, 30d supply, fill #2
  Filled 2022-09-30: qty 60, 30d supply, fill #3
  Filled 2022-11-01: qty 60, 30d supply, fill #4
  Filled 2022-12-01: qty 60, 30d supply, fill #5

## 2022-06-23 ENCOUNTER — Other Ambulatory Visit: Payer: Self-pay

## 2022-07-15 ENCOUNTER — Other Ambulatory Visit: Payer: Self-pay

## 2022-07-15 MED ORDER — SPIRONOLACTONE 25 MG PO TABS
ORAL_TABLET | ORAL | 2 refills | Status: DC
Start: 2022-07-15 — End: 2023-07-17
  Filled 2022-07-15: qty 45, 90d supply, fill #0
  Filled 2022-10-13: qty 45, 90d supply, fill #1
  Filled 2023-01-16: qty 45, 90d supply, fill #2
  Filled 2023-04-15: qty 45, 90d supply, fill #3

## 2022-07-15 MED ORDER — AREXVY 120 MCG/0.5ML IM SUSR
INTRAMUSCULAR | 0 refills | Status: DC
Start: 2022-07-15 — End: 2022-08-01
  Filled 2022-07-15: qty 1, 1d supply, fill #0

## 2022-07-15 MED ORDER — INFLUENZA VAC A&B SA ADJ QUAD 0.5 ML IM PRSY
PREFILLED_SYRINGE | INTRAMUSCULAR | 0 refills | Status: DC
  Filled 2022-07-15: qty 0.5, 1d supply, fill #0

## 2022-07-28 ENCOUNTER — Other Ambulatory Visit: Payer: Self-pay

## 2022-07-29 ENCOUNTER — Other Ambulatory Visit: Payer: Self-pay

## 2022-07-29 MED ORDER — CARVEDILOL 12.5 MG PO TABS
ORAL_TABLET | ORAL | 7 refills | Status: DC
Start: 2022-07-29 — End: 2023-08-03
  Filled 2022-07-29: qty 90, 45d supply, fill #0
  Filled 2022-09-13: qty 90, 45d supply, fill #1
  Filled 2022-11-01: qty 90, 45d supply, fill #2
  Filled 2022-12-13: qty 90, 45d supply, fill #3
  Filled 2023-01-30: qty 90, 45d supply, fill #4
  Filled 2023-03-16: qty 90, 45d supply, fill #5
  Filled 2023-05-05: qty 90, 45d supply, fill #6
  Filled 2023-06-18: qty 90, 45d supply, fill #7

## 2022-07-31 NOTE — Progress Notes (Unsigned)
There were no vitals taken for this visit.   Subjective:    Patient ID: Maurice Fischer, male    DOB: 1950-11-08, 71 y.o.   MRN: [id number]  HPI: Maurice Fischer is a 71 y.o. male  No chief complaint on file.  HTN: His blood pressure today is ***.  He denies any shortness of breath, headaches, blurred vision or chest pain.  He is currently taking carvedilol 12.5 mg daily, spironolactone 12.5 mg daily and quinapril 10 mg BID.   Hyperlipidemia: His last LDL was 59 on 04/01/2022.  He is currently taking atorvastatin 40 mg daily.  He denies any myalgia.     DM: He reports that his blood sugar has been running ***.  He denies any polyuria, polydipsia or polyphagia. He currently takes metformin at 1000 mg twice daily, jardiance 25 mg daily, januvia 100 mg daily.     History of MI/ angina/CAD/ cardiomyopathy/heart failure: Last saw cardiology on 05/30/2022, Dr Clayborn Bigness.  No changes to meds at this visit.  He is currently taking Entresto 24-26 mg every 12 hours, atorvastatin 40 mg daily, and carvedilol 25 mg BID.   He says he is discussing with Cardiology about getting a defibrillator.  He says he has not decided if he is going to do it yet.   Last cardiac cath was done 06/17/2021.  Last echo was done November 15, 2021.    His next appointment with cardiology is in April.  Last appointment with cardiology was 02/25/2022. Echo showed: SEVERE LV SYSTOLIC DYSFUNCTION NORMAL RIGHT VENTRICULAR SYSTOLIC FUNCTION NO VALVULAR STENOSIS MILD to MODERATE MR TRIVIAL TR MILD PR EF 25% Globally drepressed LVF Grade I diastolic disfunction    Vitamin D deficiency: He is currently taking vitamin d supplementation.  We checked his vitamin D last visit and it was in normal range.    Relevant past medical, surgical, family and social history reviewed and updated as indicated. Interim medical history since our last visit reviewed. Allergies and medications reviewed and updated.  Review of  Systems  Constitutional: Negative for fever or weight change.  Respiratory: Negative for cough and shortness of breath.   Cardiovascular: Negative for chest pain or palpitations.  Gastrointestinal: Negative for abdominal pain, no bowel changes.  Musculoskeletal: Negative for gait problem or joint swelling.  Skin: Negative for rash.  Neurological: Negative for dizziness or headache.  No other specific complaints in a complete review of systems (except as listed in HPI above).      Objective:    There were no vitals taken for this visit.  Wt Readings from Last 3 Encounters:  04/01/22 171 lb 1.6 oz (77.6 kg)  02/18/22 169 lb 14.4 oz (77.1 kg)  12/01/21 174 lb 6.4 oz (79.1 kg)    Physical Exam  Constitutional: Patient appears well-developed and well-nourished.  No distress.  HEENT: head atraumatic, normocephalic, pupils equal and reactive to light,  neck supple Cardiovascular: Normal rate, regular rhythm and normal heart sounds.  No murmur heard. No BLE edema. Pulmonary/Chest: Effort normal and breath sounds normal. No respiratory distress. Abdominal: Soft.  There is no tenderness. Psychiatric: Patient has a normal mood and affect. behavior is normal. Judgment and thought content normal.  Diabetic Foot Exam - Simple   No data filed     Results for orders placed or performed in visit on 04/01/22  Lipid panel  Result Value Ref Range   Cholesterol 128 <200 mg/dL   HDL 49 > OR = 40 mg/dL  Triglycerides 117 <150 mg/dL   LDL Cholesterol (Calc) 59 mg/dL (calc)   Total CHOL/HDL Ratio 2.6 <5.0 (calc)   Non-HDL Cholesterol (Calc) 79 <130 mg/dL (calc)  CBC with Differential/Platelet  Result Value Ref Range   WBC 8.0 3.8 - 10.8 Thousand/uL   RBC 4.84 4.20 - 5.80 Million/uL   Hemoglobin 14.8 13.2 - 17.1 g/dL   HCT 43.3 38.5 - 50.0 %   MCV 89.5 80.0 - 100.0 fL   MCH 30.6 27.0 - 33.0 pg   MCHC 34.2 32.0 - 36.0 g/dL   RDW 12.7 11.0 - 15.0 %   Platelets 256 140 - 400 Thousand/uL   MPV  9.3 7.5 - 12.5 fL   Neutro Abs 4,760 1,500 - 7,800 cells/uL   Lymphs Abs 2,296 850 - 3,900 cells/uL   Absolute Monocytes 536 200 - 950 cells/uL   Eosinophils Absolute 336 15 - 500 cells/uL   Basophils Absolute 72 0 - 200 cells/uL   Neutrophils Relative % 59.5 %   Total Lymphocyte 28.7 %   Monocytes Relative 6.7 %   Eosinophils Relative 4.2 %   Basophils Relative 0.9 %  Hemoglobin A1c  Result Value Ref Range   Hgb A1c MFr Bld 7.5 (H) <5.7 % of total Hgb   Mean Plasma Glucose 169 mg/dL   eAG (mmol/L) 9.3 mmol/L  VITAMIN D 25 Hydroxy (Vit-D Deficiency, Fractures)  Result Value Ref Range   Vit D, 25-Hydroxy 56 30 - 100 ng/mL  COMPLETE METABOLIC PANEL WITH GFR  Result Value Ref Range   Glucose, Bld 142 (H) 65 - 99 mg/dL   BUN 20 7 - 25 mg/dL   Creat 0.83 0.70 - 1.28 mg/dL   eGFR 93 > OR = 60 mL/min/1.77m   BUN/Creatinine Ratio NOT APPLICABLE 6 - 22 (calc)   Sodium 138 135 - 146 mmol/L   Potassium 5.1 3.5 - 5.3 mmol/L   Chloride 103 98 - 110 mmol/L   CO2 27 20 - 32 mmol/L   Calcium 9.9 8.6 - 10.3 mg/dL   Total Protein 6.6 6.1 - 8.1 g/dL   Albumin 4.3 3.6 - 5.1 g/dL   Globulin 2.3 1.9 - 3.7 g/dL (calc)   AG Ratio 1.9 1.0 - 2.5 (calc)   Total Bilirubin 0.6 0.2 - 1.2 mg/dL   Alkaline phosphatase (APISO) 47 35 - 144 U/L   AST 16 10 - 35 U/L   ALT 22 9 - 46 U/L      Assessment & Plan:   Problem List Items Addressed This Visit   None    Follow up plan: No follow-ups on file.

## 2022-08-01 ENCOUNTER — Ambulatory Visit (INDEPENDENT_AMBULATORY_CARE_PROVIDER_SITE_OTHER): Payer: Medicare Other | Admitting: Nurse Practitioner

## 2022-08-01 ENCOUNTER — Other Ambulatory Visit: Payer: Self-pay

## 2022-08-01 ENCOUNTER — Encounter: Payer: Self-pay | Admitting: Nurse Practitioner

## 2022-08-01 VITALS — BP 122/74 | HR 78 | Temp 97.7°F | Resp 18 | Wt 173.0 lb

## 2022-08-01 DIAGNOSIS — E11618 Type 2 diabetes mellitus with other diabetic arthropathy: Secondary | ICD-10-CM

## 2022-08-01 DIAGNOSIS — I10 Essential (primary) hypertension: Secondary | ICD-10-CM | POA: Diagnosis not present

## 2022-08-01 DIAGNOSIS — E785 Hyperlipidemia, unspecified: Secondary | ICD-10-CM | POA: Diagnosis not present

## 2022-08-01 DIAGNOSIS — E559 Vitamin D deficiency, unspecified: Secondary | ICD-10-CM | POA: Diagnosis not present

## 2022-08-01 DIAGNOSIS — I509 Heart failure, unspecified: Secondary | ICD-10-CM | POA: Insufficient documentation

## 2022-08-01 DIAGNOSIS — I429 Cardiomyopathy, unspecified: Secondary | ICD-10-CM | POA: Insufficient documentation

## 2022-08-01 DIAGNOSIS — I2510 Atherosclerotic heart disease of native coronary artery without angina pectoris: Secondary | ICD-10-CM

## 2022-08-01 DIAGNOSIS — I252 Old myocardial infarction: Secondary | ICD-10-CM

## 2022-08-01 DIAGNOSIS — I209 Angina pectoris, unspecified: Secondary | ICD-10-CM

## 2022-08-01 LAB — POCT GLYCOSYLATED HEMOGLOBIN (HGB A1C): Hemoglobin A1C: 7.4 % — AB (ref 4.0–5.6)

## 2022-08-01 MED ORDER — GLUCOSE BLOOD VI STRP
ORAL_STRIP | 3 refills | Status: DC
  Filled 2022-08-01: qty 100, 100d supply, fill #0

## 2022-08-01 NOTE — Assessment & Plan Note (Signed)
Patient's A1c today is 7.4.  He is currently taking metformin 1000 mg twice a day, Jardiance 25 mg daily and Januvia 100 mg daily.  Get microalbumin urine today.

## 2022-08-01 NOTE — Assessment & Plan Note (Signed)
Continue taking Entresto 24-26 mg every 12 hours, atorvastatin 40 mg daily, carvedilol 25 mg twice daily, spironolactone 12.5 mg and quinapril 10 mg twice daily. Keep follow-up appointments with cardiology.

## 2022-08-01 NOTE — Assessment & Plan Note (Signed)
Continue taking atorvastatin 40 mg daily. 

## 2022-08-01 NOTE — Assessment & Plan Note (Signed)
Continue taking vitamin D supplementation. 

## 2022-08-01 NOTE — Assessment & Plan Note (Signed)
Blood pressure 122/74 today.  Continue taking carvedilol 12.5 mg BID, spironolactone 12.5 mg daily and quinapril 10 mg twice daily.

## 2022-08-02 LAB — MICROALBUMIN / CREATININE URINE RATIO
Creatinine, Urine: 44 mg/dL (ref 20–320)
Microalb Creat Ratio: 5 mcg/mg creat (ref <30)
Microalb, Ur: 0.2 mg/dL

## 2022-08-05 ENCOUNTER — Other Ambulatory Visit: Payer: Self-pay

## 2022-08-19 ENCOUNTER — Other Ambulatory Visit: Payer: Self-pay

## 2022-08-19 MED ORDER — COVID-19 MRNA 2023-2024 VACCINE (COMIRNATY) 0.3 ML INJECTION
0.3000 mL | Freq: Once | INTRAMUSCULAR | 0 refills | Status: AC
  Filled 2022-08-22: qty 0.3, 1d supply, fill #0

## 2022-08-22 ENCOUNTER — Other Ambulatory Visit: Payer: Self-pay

## 2022-09-01 ENCOUNTER — Other Ambulatory Visit: Payer: Self-pay

## 2022-09-06 ENCOUNTER — Ambulatory Visit (INDEPENDENT_AMBULATORY_CARE_PROVIDER_SITE_OTHER): Payer: Medicare Other | Admitting: Internal Medicine

## 2022-09-06 ENCOUNTER — Encounter: Payer: Self-pay | Admitting: Internal Medicine

## 2022-09-06 VITALS — BP 120/62 | HR 93 | Temp 98.6°F | Resp 16 | Ht 68.0 in | Wt 174.4 lb

## 2022-09-06 DIAGNOSIS — Z0000 Encounter for general adult medical examination without abnormal findings: Secondary | ICD-10-CM | POA: Diagnosis not present

## 2022-09-06 NOTE — Progress Notes (Signed)
Subjective:   Maurice Fischer is a 71 y.o. male who presents for Medicare Annual/Subsequent preventive examination.  Review of Systems: no pertinent positives     Objective:    Vitals: BP 120/62   Pulse 93   Temp 98.6 F (37 C)   Resp 16   Ht '5\' 8"'$  (1.727 m)   Wt 174 lb 6.4 oz (79.1 kg)   SpO2 96%   BMI 26.52 kg/m   Body mass index is 26.52 kg/m.     06/17/2021    7:25 AM 08/27/2020    3:08 PM 07/25/2019    3:15 PM 05/10/2018   12:58 PM 06/27/2017   10:22 AM 04/13/2017    9:17 AM 03/28/2017   10:49 AM  Advanced Directives  Does Patient Have a Medical Advance Directive? No No No No No No No  Would patient like information on creating a medical advance directive? No - Patient declined Yes (MAU/Ambulatory/Procedural Areas - Information given) Yes (MAU/Ambulatory/Procedural Areas - Information given) Yes (MAU/Ambulatory/Procedural Areas - Information given)       Tobacco Social History   Tobacco Use  Smoking Status Former   Packs/day: 2.00   Years: 20.00   Total pack years: 40.00   Types: Cigarettes   Quit date: 10/08/1992   Years since quitting: 29.9  Smokeless Tobacco Never  Tobacco Comments   smoking cessation materials not required     Counseling given: Not Answered Tobacco comments: smoking cessation materials not required   Clinical Intake:  Pre-visit preparation completed: No  Pain : No/denies pain     Nutritional Status: BMI 25 -29 Overweight Nutritional Risks: None Diabetes: Yes CBG done?: No Did pt. bring in CBG monitor from home?: Yes Glucose Meter Downloaded?: No     Nutrition Risk Assessment:  Has the patient had any N/V/D within the last 2 months?  No  Does the patient have any non-healing wounds?  No  Has the patient had any unintentional weight loss or weight gain?  No   Diabetes:  Is the patient diabetic?  Yes  If diabetic, was a CBG obtained today?  Yes  197 Did the patient bring in their glucometer from home?  No  How often  do you monitor your CBG's? 1x week.   Financial Strains and Diabetes Management:  Are you having any financial strains with the device, your supplies or your medication? No .  Does the patient want to be seen by Chronic Care Management for management of their diabetes?  Yes  Would the patient like to be referred to a Nutritionist or for Diabetic Management?  No   Diabetic Exams:  Diabetic Eye Exam: Completed 08/26/22 [facility].  Diabetic Foot Exam: Completed 04/01/22.          Past Medical History:  Diagnosis Date   Diabetes mellitus without complication ([place])    Diverticulosis 2004   Heart disease    Hyperlipidemia    Hypertension    Inguinal hernia 6433   right   Umbilical hernia 2951   Past Surgical History:  Procedure Laterality Date   blister cut open on right thumb   03/2017   COLONOSCOPY  2004   [place]?   HERNIA REPAIR  88/[id number]/6606   umbilical hernia/ Dr Jamal Collin   HERNIA REPAIR  01/13/2014   right inguinal hernia repair/ Dr Jamal Collin   LEFT HEART CATH AND CORONARY ANGIOGRAPHY N/A 06/17/2021   Procedure: LEFT HEART CATH AND CORONARY ANGIOGRAPHY with Possible Intervention;  Surgeon:  Callwood, Dwayne D, MD;  Location: [facility];  Service: Cardiovascular;  Laterality: N/A;   TONSILLECTOMY  1957?   Family History  Problem Relation Age of Onset   Cancer Father        prostate   Coronary artery disease Mother    Healthy Sister    Depression Brother    Social History   Socioeconomic History   Marital status: Married    Spouse name: Declined   Number of children: 0   Years of education: some college   Highest education level: 12th grade  Occupational History   Occupation: Retired  Tobacco Use   Smoking status: Former    Packs/day: 2.00    Years: 20.00    Total pack years: 40.00    Types: Cigarettes    Quit date: 10/08/1992    Years since quitting: 29.9   Smokeless tobacco: Never   Tobacco comments:    smoking cessation materials  not required  Vaping Use   Vaping Use: Never used  Substance and Sexual Activity   Alcohol use: Yes    Alcohol/week: 7.0 standard drinks of alcohol    Types: 7 Standard drinks or equivalent per week   Drug use: No   Sexual activity: Not Currently  Other Topics Concern   Not on file  Social History Narrative   Not on file   Social Determinants of Health   Financial Resource Strain: Low Risk  (09/06/2022)   Overall Financial Resource Strain (CARDIA)    Difficulty of Paying Living Expenses: Not hard at all  Food Insecurity: No Food Insecurity (09/06/2022)   Hunger Vital Sign    Worried About Running Out of Food in the Last Year: Never true    Ran Out of Food in the Last Year: Never true  Transportation Needs: No Transportation Needs (09/06/2022)   PRAPARE - Hydrologist (Medical): No    Lack of Transportation (Non-Medical): No  Physical Activity: Sufficiently Active (09/06/2022)   Exercise Vital Sign    Days of Exercise per Week: 3 days    Minutes of Exercise per Session: 80 min  Stress: No Stress Concern Present (09/06/2022)   [place]    Feeling of Stress : Only a little  Social Connections: Moderately Isolated (09/06/2022)   Social Connection and Isolation Panel [NHANES]    Frequency of Communication with Friends and Family: Once a week    Frequency of Social Gatherings with Friends and Family: Twice a week    Attends Religious Services: Never    Marine scientist or Organizations: No    Attends Archivist Meetings: Never    Marital Status: Married    Outpatient Encounter Medications as of 09/06/2022  Medication Sig   aspirin 81 MG tablet Take 81 mg by mouth daily.   atorvastatin (LIPITOR) 40 MG tablet TAKE 1 TABLET (40 MG TOTAL) BY MOUTH DAILY.   carvedilol (COREG) 12.5 MG tablet Take 1 tablet (12.5 mg total) by mouth 2 (two) times daily with meals    Cholecalciferol (VITAMIN D3) 2000 units TABS Take 2,000 Units by mouth daily.   empagliflozin (JARDIANCE) 25 MG TABS tablet TAKE 1 TABLET BY MOUTH DAILY BEFORE BREAKFAST.   glucose blood test strip Use as directed to monitor blood sugars once a day   metFORMIN (GLUCOPHAGE) 1000 MG tablet TAKE 1 TABLET BY MOUTH TWICE DAILY WITH A MEAL   Multiple Vitamin (MULTIVITAMIN) tablet  Take 1 tablet by mouth daily.   nitroGLYCERIN (NITROSTAT) 0.4 MG SL tablet Place 1 tablet (0.4 mg total) under the tongue every 5 (five) minutes as needed for chest pain. May take up to 3 doses.   sacubitril-valsartan (ENTRESTO) 24-26 MG Take 1 tablet by mouth every 12 (twelve) hours   sitaGLIPtin (JANUVIA) 100 MG tablet TAKE 1 TABLET BY MOUTH DAILY.   spironolactone (ALDACTONE) 25 MG tablet Take 0.5 tablets (12.5 mg total) by mouth once daily   tetrahydrozoline-zinc (VISINE-AC) 0.05-0.25 % ophthalmic solution Place 2 drops into both eyes daily as needed (eye irritation).   No facility-administered encounter medications on file as of 09/06/2022.    Activities of Daily Living    09/06/2022    3:10 PM 08/01/2022    8:29 AM  In your present state of health, do you have any difficulty performing the following activities:  Hearing? 0 0  Vision? 0 0  Difficulty concentrating or making decisions? 0 0  Walking or climbing stairs? 0 0  Dressing or bathing? 0 0  Doing errands, shopping? 0     Patient Care Team: Bo Merino, FNP as PCP - General (Nurse Practitioner) Yolonda Kida, MD as Consulting Physician (Cardiology)   Assessment:   This is a routine wellness examination for Cheryl.  Exercise Activities and Dietary recommendations     Goals Addressed   None     Fall Risk    09/06/2022    3:09 PM 08/01/2022    8:29 AM 04/01/2022    8:12 AM 02/18/2022    8:42 AM 02/02/2022   10:48 AM  Fall Risk   Falls in the past year? 0 0 0 0 0  Number falls in past yr: 0 0 0 0 0  Injury with Fall? 0 0 0 0 0  Risk  for fall due to :     No Fall Risks  Follow up  Falls evaluation completed Falls evaluation completed Falls evaluation completed Falls prevention discussed   FALL RISK PREVENTION PERTAINING TO THE HOME:  Any stairs in or around the home? Yes outside home If so, are there any without handrails? Yes   Home free of loose throw rugs in walkways, pet beds, electrical cords, etc? Yes  Adequate lighting in your home to reduce risk of falls? Yes   ASSISTIVE DEVICES UTILIZED TO PREVENT FALLS:  Life alert? No  Use of a cane, walker or w/c? No  Grab bars in the bathroom? Yes  Shower chair or bench in shower? No  Elevated toilet seat or a handicapped toilet? No    TIMED UP AND GO:  Was the test performed? Yes .  Length of time to ambulate 10 feet: 2 sec.   GAIT:  Appearance of gait: Gait steady and fast without use of device. Education: Fall risk prevention has been discussed.  Intervention(s) required? No   DME/home health order needed?  No    Depression Screen    09/06/2022    3:09 PM 08/01/2022    8:29 AM 04/01/2022    8:13 AM 02/18/2022    8:42 AM  PHQ 2/9 Scores  PHQ - 2 Score 0 0 0 0  PHQ- 9 Score 0       Cognitive Function        07/25/2019    3:19 PM 05/10/2018   12:46 PM  6CIT Screen  What Year? 0 points 0 points  What month? 0 points 0 points  What time? 0 points 0  points  Count back from 20 0 points 0 points  Months in reverse 0 points 0 points  Repeat phrase 0 points 0 points  Total Score 0 points 0 points    Immunization History  Administered Date(s) Administered   COVID-19, mRNA, vaccine(Comirnaty)12 years and older 08/22/2022   Fluad Quad(high Dose 65+) 07/02/2019, 07/15/2020, 07/30/2021, 07/15/2022   Influenza Split 08/16/2007   Influenza, High Dose Seasonal PF 07/22/2015, 07/06/2016, 06/27/2017, 07/09/2018   Influenza,inj,Quad PF,6+ Mos 06/26/2014   Influenza-Unspecified 07/02/2019   Moderna Covid-19 Vaccine Bivalent Booster 54yr & up 08/17/2021    Moderna SARS-COV2 Booster Vaccination 09/11/2020, 02/05/2021   PFIZER(Purple Top)SARS-COV-2 Vaccination 12/15/2019, 01/07/2020   Pneumococcal Conjugate-13 10/08/2014   Pneumococcal Polysaccharide-23 10/08/2014, 05/10/2018   Respiratory Syncytial Virus Vaccine,Recomb Aduvanted(Arexvy) 07/15/2022   Td 03/04/2013   Zoster Recombinat (Shingrix) 08/17/2018, 12/18/2018   Zoster, Live 06/28/2011    Qualifies for Shingles Vaccine? Yes  Zostavax completed . Due for Shingrix. Education has been provided regarding the importance of this vaccine. Pt has been advised to call insurance company to determine out of pocket expense. Advised may also receive vaccine at local pharmacy or Health Dept. Verbalized acceptance and understanding.  Tdap: Although this vaccine is not a covered service during a Wellness Exam, does the patient still wish to receive this vaccine today?  No . Has already had on 03/04/13.  Flu Vaccine: Due for Flu vaccine. Already given on 07/15/22 .  Pneumococcal Vaccine: Due for Pneumococcal vaccine. Already done on 05/10/18.  Covid-19 Vaccine:  done and up to date on boosters.  Screening Tests Health Maintenance  Topic Date Due   Medicare Annual Wellness (AWV)  08/31/2022   COVID-19 Vaccine (4 - Mixed Product risk series) 09/22/2022 (Originally 10/12/2021)   OPHTHALMOLOGY EXAM  09/14/2022   HEMOGLOBIN A1C  01/31/2023   TETANUS/TDAP  03/05/2023   Diabetic kidney evaluation - GFR measurement  04/02/2023   FOOT EXAM  04/02/2023   Diabetic kidney evaluation - Urine ACR  08/02/2023   COLONOSCOPY (Pts 45-459yrInsurance coverage will need to be confirmed)  01/08/2024   Pneumonia Vaccine 71Years old  Completed   INFLUENZA VACCINE  Completed   Hepatitis C Screening  Completed   Zoster Vaccines- Shingrix  Completed   HPV VACCINES  Aged Out   Cancer Screenings:  Colorectal Screening: Completed 01/07/2014. Repeat every 10 years;   Lung Cancer Screening: (Low Dose CT Chest  recommended if Age 73-80ears, 30 pack-year currently smoking OR have quit w/in 15years.) does not qualify.   Lung Cancer Screening Referral: An Epic message has been sent to ShBurgess EstelleRN (Oncology Nurse Navigator) regarding the possible need for this exam. ShRaquel Sarnaill review the patient's chart to determine if the patient truly qualifies for the exam. If the patient qualifies, ShRaquel Sarnaill order the Low Dose CT of the chest to facilitate the scheduling of this exam.  Additional Screening:  Hepatitis C Screening:  qualify; Completed 02/23/17 negative  Dental Screening: Recommended annual dental exams for proper oral hygiene.  Pt goes every 6 months  Community Resource Referral:  CRR required this visit?  No        Plan:  I have personally reviewed and addressed the Medicare Annual Wellness questionnaire and have noted the following in the patient's chart:  A. Medical and social history B. Use of alcohol, tobacco or illicit drugs  C. Current medications and supplements D. Functional ability and status E.  Nutritional status F.  Physical activity G. Advance directives H.  List of other physicians I.  Hospitalizations, surgeries, and ER visits in previous 12 months J.  Animas such as hearing and vision if needed, cognitive and depression L. Referrals and appointments   In addition, I have reviewed and discussed with patient certain preventive protocols, quality metrics, and best practice recommendations. A written personalized care plan for preventive services as well as general preventive health recommendations were provided to patient.   Signed,   Teodora Medici, DO  09/06/2022 Attending Physician

## 2022-09-13 ENCOUNTER — Other Ambulatory Visit: Payer: Self-pay

## 2022-09-13 LAB — HM DIABETES EYE EXAM

## 2022-09-14 ENCOUNTER — Encounter: Payer: Self-pay | Admitting: Nurse Practitioner

## 2022-09-22 ENCOUNTER — Other Ambulatory Visit: Payer: Self-pay

## 2022-09-30 ENCOUNTER — Other Ambulatory Visit: Payer: Self-pay

## 2022-10-25 ENCOUNTER — Other Ambulatory Visit: Payer: Self-pay

## 2022-11-29 DIAGNOSIS — E119 Type 2 diabetes mellitus without complications: Secondary | ICD-10-CM | POA: Diagnosis not present

## 2022-11-29 DIAGNOSIS — I5022 Chronic systolic (congestive) heart failure: Secondary | ICD-10-CM | POA: Diagnosis not present

## 2022-11-29 DIAGNOSIS — R0609 Other forms of dyspnea: Secondary | ICD-10-CM | POA: Diagnosis not present

## 2022-11-29 DIAGNOSIS — Z87891 Personal history of nicotine dependence: Secondary | ICD-10-CM | POA: Diagnosis not present

## 2022-11-29 DIAGNOSIS — I429 Cardiomyopathy, unspecified: Secondary | ICD-10-CM | POA: Diagnosis not present

## 2022-11-29 DIAGNOSIS — I2510 Atherosclerotic heart disease of native coronary artery without angina pectoris: Secondary | ICD-10-CM | POA: Diagnosis not present

## 2022-11-29 DIAGNOSIS — I209 Angina pectoris, unspecified: Secondary | ICD-10-CM | POA: Diagnosis not present

## 2022-11-29 DIAGNOSIS — E7800 Pure hypercholesterolemia, unspecified: Secondary | ICD-10-CM | POA: Diagnosis not present

## 2022-12-01 NOTE — Progress Notes (Signed)
BP 124/72   Pulse 71   Temp 97.7 F (36.5 C) (Oral)   Resp 16   Ht 5' 8"$  (1.727 m)   Wt 173 lb (78.5 kg)   SpO2 98%   BMI 26.30 kg/m    Subjective:    Patient ID: Maurice Fischer, male    DOB: [birth year], 72 y.o.   MRN: [id number]  HPI: Maurice Fischer is a 72 y.o. male  Chief Complaint  Patient presents with   Hypertension   Hyperlipidemia   Diabetes    4 month follow up   HTN: Chest pain, blurred vision, headaches or shortness of breath.  Patient currently takes carvedilol 12.5 mg twice daily, quinapril 10 mg twice daily and spironolactone 12.5 mg daily.  His blood pressure today is 124/72.    Hyperlipidemia: He is currently taking atorvastatin 40 mg daily.  He denies any myalgia.  His last LDL was 59 on 04/01/2022.   DM: Patient's last A1c was 7.4 on 08/01/2022.  Reports his blood sugar has been running 150-160.  Currently takes Jardiance 25 mg daily, Januvia 100 mg daily and metformin 1000 mg twice a day.  On his foot and eye exam, urine microalbumin. He says he eats what he wants to eat and is not planning on changing that.    History of MI/ angina/CAD/ cardiomyopathy/heart failure: He is established with Dr. Clayborn Bigness.  He last saw cardiology on 11/29/2022.  He currently takes atorvastatin 40 mg daily, carvedilol 25 mg twice daily and Entresto 24-26 mg every 12 hours.  Cardiac cath with 06/17/2021.  Last echo was done November 15, 2021.  Cardiology has recommended defibrillator.  Patient is deferring at this time.    Echo showed: SEVERE LV SYSTOLIC DYSFUNCTION NORMAL RIGHT VENTRICULAR SYSTOLIC FUNCTION NO VALVULAR STENOSIS MILD to MODERATE MR TRIVIAL TR MILD PR EF 25% Globally drepressed LVF Grade I diastolic disfunction    Vitamin D deficiency: Patient continues to take vitamin D supplementation.  Last few times he checked his vitamin D level has been normal.    Relevant past medical, surgical, family and social history reviewed and updated as indicated. Interim  medical history since our last visit reviewed. Allergies and medications reviewed and updated.  Review of Systems  Constitutional: Negative for fever or weight change.  Respiratory: Negative for cough and shortness of breath.   Cardiovascular: Negative for chest pain or palpitations.  Gastrointestinal: Negative for abdominal pain, no bowel changes.  Musculoskeletal: Negative for gait problem or joint swelling.  Skin: Negative for rash.  Neurological: Negative for dizziness or headache.  No other specific complaints in a complete review of systems (except as listed in HPI above).      Objective:    BP 124/72   Pulse 71   Temp 97.7 F (36.5 C) (Oral)   Resp 16   Ht 5' 8"$  (1.727 m)   Wt 173 lb (78.5 kg)   SpO2 98%   BMI 26.30 kg/m   Wt Readings from Last 3 Encounters:  12/02/22 173 lb (78.5 kg)  09/06/22 174 lb 6.4 oz (79.1 kg)  08/01/22 173 lb (78.5 kg)    Physical Exam  Constitutional: Patient appears well-developed and well-nourished.  No distress.  HEENT: head atraumatic, normocephalic, pupils equal and reactive to light,  neck supple Cardiovascular: Normal rate, regular rhythm and normal heart sounds.  No murmur heard. No BLE edema. Pulmonary/Chest: Effort normal and breath sounds normal. No respiratory distress. Abdominal: Soft.  There is no tenderness. Psychiatric:  Patient has a normal mood and affect. behavior is normal. Judgment and thought content normal.   Results for orders placed or performed in visit on 09/14/22  HM DIABETES EYE EXAM  Result Value Ref Range   HM Diabetic Eye Exam No Retinopathy No Retinopathy      Assessment & Plan:   Problem List Items Addressed This Visit       Cardiovascular and Mediastinum   Coronary artery disease involving native coronary artery of native heart without angina pectoris (Chronic)     He currently takes atorvastatin 40 mg daily, carvedilol 25 mg twice daily and Entresto 24-26 mg every 12 hours.         Atherosclerosis of coronary artery     He currently takes atorvastatin 40 mg daily, carvedilol 25 mg twice daily and Entresto 24-26 mg every 12 hours.        Relevant Orders   Lipid panel   Essential hypertension - Primary    Continue taking carvedilol 12.5 mg BID, spironolactone 12.5 mg daily and quinapril 10 mg twice daily.       Relevant Orders   CBC with Differential/Platelet   COMPLETE METABOLIC PANEL WITH GFR   Angina pectoris ([place])     He currently takes atorvastatin 40 mg daily, carvedilol 25 mg twice daily and Entresto 24-26 mg every 12 hours.        Relevant Orders   Lipid panel   Heart failure ([place])     He currently takes atorvastatin 40 mg daily, carvedilol 25 mg twice daily and Entresto 24-26 mg every 12 hours.        Relevant Orders   Lipid panel   Cardiomyopathy [facility])     He currently takes atorvastatin 40 mg daily, carvedilol 25 mg twice daily and Entresto 24-26 mg every 12 hours.        Relevant Orders   Lipid panel     Endocrine   Diabetes mellitus type II, controlled ([place])    He is currently taking metformin 1000 mg twice a day, Jardiance 25 mg daily and Januvia 100 mg daily.       Relevant Orders   Hemoglobin A1c   COMPLETE METABOLIC PANEL WITH GFR     Other   Hyperlipidemia LDL goal <70 (Chronic)   Relevant Orders   COMPLETE METABOLIC PANEL WITH GFR   Lipid panel   Vitamin D deficiency    Continues to take vitamin d supplement      History of MI (myocardial infarction)   Relevant Orders   Lipid panel     Follow up plan: Return in about 4 months (around 04/02/2023) for follow up.

## 2022-12-02 ENCOUNTER — Ambulatory Visit (INDEPENDENT_AMBULATORY_CARE_PROVIDER_SITE_OTHER): Payer: Medicare Other | Admitting: Nurse Practitioner

## 2022-12-02 ENCOUNTER — Other Ambulatory Visit: Payer: Self-pay

## 2022-12-02 ENCOUNTER — Encounter: Payer: Self-pay | Admitting: Nurse Practitioner

## 2022-12-02 VITALS — BP 124/72 | HR 71 | Temp 97.7°F | Resp 16 | Ht 68.0 in | Wt 173.0 lb

## 2022-12-02 DIAGNOSIS — E559 Vitamin D deficiency, unspecified: Secondary | ICD-10-CM | POA: Diagnosis not present

## 2022-12-02 DIAGNOSIS — I10 Essential (primary) hypertension: Secondary | ICD-10-CM

## 2022-12-02 DIAGNOSIS — E11618 Type 2 diabetes mellitus with other diabetic arthropathy: Secondary | ICD-10-CM

## 2022-12-02 DIAGNOSIS — I2510 Atherosclerotic heart disease of native coronary artery without angina pectoris: Secondary | ICD-10-CM | POA: Diagnosis not present

## 2022-12-02 DIAGNOSIS — I429 Cardiomyopathy, unspecified: Secondary | ICD-10-CM

## 2022-12-02 DIAGNOSIS — E785 Hyperlipidemia, unspecified: Secondary | ICD-10-CM

## 2022-12-02 DIAGNOSIS — I509 Heart failure, unspecified: Secondary | ICD-10-CM

## 2022-12-02 DIAGNOSIS — I209 Angina pectoris, unspecified: Secondary | ICD-10-CM

## 2022-12-02 DIAGNOSIS — I252 Old myocardial infarction: Secondary | ICD-10-CM

## 2022-12-02 NOTE — Assessment & Plan Note (Signed)
He currently takes atorvastatin 40 mg daily, carvedilol 25 mg twice daily and Entresto 24-26 mg every 12 hours.

## 2022-12-02 NOTE — Assessment & Plan Note (Signed)
He is currently taking metformin 1000 mg twice a day, Jardiance 25 mg daily and Januvia 100 mg daily.

## 2022-12-02 NOTE — Assessment & Plan Note (Signed)
Continues to take vitamin d supplement

## 2022-12-02 NOTE — Assessment & Plan Note (Signed)
Continue taking carvedilol 12.5 mg BID, spironolactone 12.5 mg daily and quinapril 10 mg twice daily.

## 2022-12-03 LAB — COMPLETE METABOLIC PANEL WITH GFR
AG Ratio: 1.8 (calc) (ref 1.0–2.5)
ALT: 24 U/L (ref 9–46)
AST: 17 U/L (ref 10–35)
Albumin: 4.3 g/dL (ref 3.6–5.1)
Alkaline phosphatase (APISO): 42 U/L (ref 35–144)
BUN: 21 mg/dL (ref 7–25)
CO2: 28 mmol/L (ref 20–32)
Calcium: 10 mg/dL (ref 8.6–10.3)
Chloride: 101 mmol/L (ref 98–110)
Creat: 1.06 mg/dL (ref 0.70–1.28)
Globulin: 2.4 g/dL (calc) (ref 1.9–3.7)
Glucose, Bld: 159 mg/dL — ABNORMAL HIGH (ref 65–99)
Potassium: 4.9 mmol/L (ref 3.5–5.3)
Sodium: 137 mmol/L (ref 135–146)
Total Bilirubin: 0.7 mg/dL (ref 0.2–1.2)
Total Protein: 6.7 g/dL (ref 6.1–8.1)
eGFR: 74 mL/min/{1.73_m2} (ref >=60)

## 2022-12-03 LAB — CBC WITH DIFFERENTIAL/PLATELET
Absolute Monocytes: 561 cells/uL (ref 200–950)
Basophils Absolute: 43 cells/uL (ref 0–200)
Basophils Relative: 0.6 %
Eosinophils Absolute: 504 cells/uL — ABNORMAL HIGH (ref 15–500)
Eosinophils Relative: 7.1 %
HCT: 44.6 % (ref 38.5–50.0)
Hemoglobin: 15.2 g/dL (ref 13.2–17.1)
Lymphs Abs: 2251 cells/uL (ref 850–3900)
MCH: 30.2 pg (ref 27.0–33.0)
MCHC: 34.1 g/dL (ref 32.0–36.0)
MCV: 88.5 fL (ref 80.0–100.0)
MPV: 9.3 fL (ref 7.5–12.5)
Monocytes Relative: 7.9 %
Neutro Abs: 3742 cells/uL (ref 1500–7800)
Neutrophils Relative %: 52.7 %
Platelets: 220 10*3/uL (ref 140–400)
RBC: 5.04 10*6/uL (ref 4.20–5.80)
RDW: 12.1 % (ref 11.0–15.0)
Total Lymphocyte: 31.7 %
WBC: 7.1 10*3/uL (ref 3.8–10.8)

## 2022-12-03 LAB — LIPID PANEL
Cholesterol: 126 mg/dL (ref <200)
HDL: 41 mg/dL (ref >=40)
LDL Cholesterol (Calc): 62 mg/dL (calc)
Non-HDL Cholesterol (Calc): 85 mg/dL (calc) (ref <130)
Total CHOL/HDL Ratio: 3.1 (calc) (ref <5.0)
Triglycerides: 146 mg/dL (ref <150)

## 2022-12-03 LAB — HEMOGLOBIN A1C
Hgb A1c MFr Bld: 8.1 % of total Hgb — ABNORMAL HIGH (ref <5.7)
Mean Plasma Glucose: 186 mg/dL
eAG (mmol/L): 10.3 mmol/L

## 2022-12-08 ENCOUNTER — Other Ambulatory Visit: Payer: Self-pay | Admitting: Nurse Practitioner

## 2022-12-08 DIAGNOSIS — E11618 Type 2 diabetes mellitus with other diabetic arthropathy: Secondary | ICD-10-CM

## 2022-12-08 NOTE — Telephone Encounter (Signed)
Requested medications are due for refill today.  yes  Requested medications are on the active medications list.  yes  Last refill. 12/21/2021 #180 3 rf  Future visit scheduled.   yes  Notes to clinic.  Rx written to expired 12/03/2022 - Rx is expired.    Requested Prescriptions  Pending Prescriptions Disp Refills   metFORMIN (GLUCOPHAGE) 1000 MG tablet 180 tablet 3    Sig: TAKE 1 TABLET BY MOUTH TWICE DAILY WITH A MEAL     Endocrinology:  Diabetes - Biguanides Failed - 12/08/2022  5:42 PM      Failed - HBA1C is between 0 and 7.9 and within 180 days    Hemoglobin A1C  Date Value Ref Range Status  05/14/2019 8.4  Final   HbA1c, POC (prediabetic range)  Date Value Ref Range Status  09/09/2019 6.3 5.7 - 6.4 % Final   Hgb A1c MFr Bld  Date Value Ref Range Status  12/02/2022 8.1 (H) <5.7 % of total Hgb Final    Comment:    For someone without known diabetes, a hemoglobin A1c value of 6.5% or greater indicates that they may have  diabetes and this should be confirmed with a follow-up  test. . For someone with known diabetes, a value <7% indicates  that their diabetes is well controlled and a value  greater than or equal to 7% indicates suboptimal  control. A1c targets should be individualized based on  duration of diabetes, age, comorbid conditions, and  other considerations. . Currently, no consensus exists regarding use of hemoglobin A1c for diagnosis of diabetes for children. .          Failed - B12 Level in normal range and within 720 days    Vitamin B-12  Date Value Ref Range Status  04/18/2016 380 211 - 946 pg/mL Final         Failed - CBC within normal limits and completed in the last 12 months    WBC  Date Value Ref Range Status  12/02/2022 7.1 3.8 - 10.8 Thousand/uL Final   RBC  Date Value Ref Range Status  12/02/2022 5.04 4.20 - 5.80 Million/uL Final   Hemoglobin  Date Value Ref Range Status  12/02/2022 15.2 13.2 - 17.1 g/dL Final   HGB  Date  Value Ref Range Status  01/06/2014 14.2 13.0 - 18.0 g/dL Final   HCT  Date Value Ref Range Status  12/02/2022 44.6 38.5 - 50.0 % Final  01/06/2014 43.7 40.0 - 52.0 % Final   MCHC  Date Value Ref Range Status  12/02/2022 34.1 32.0 - 36.0 g/dL Final   [facility]  Date Value Ref Range Status  12/02/2022 30.2 27.0 - 33.0 pg Final   MCV  Date Value Ref Range Status  12/02/2022 88.5 80.0 - 100.0 fL Final  01/06/2014 88 80 - 100 fL Final   No results found for: "PLTCOUNTKUC", "LABPLAT", "POCPLA" RDW  Date Value Ref Range Status  12/02/2022 12.1 11.0 - 15.0 % Final  01/06/2014 12.7 11.5 - 14.5 % Final         Passed - Cr in normal range and within 360 days    Creat  Date Value Ref Range Status  12/02/2022 1.06 0.70 - 1.28 mg/dL Final   Creatinine, Urine  Date Value Ref Range Status  08/01/2022 44 20 - 320 mg/dL Final         Passed - eGFR in normal range and within 360 days    GFR, Est African American  Date Value Ref Range Status  02/19/2021 102 > OR = 60 mL/min/1.97m Final   GFR, Est Non African American  Date Value Ref Range Status  02/19/2021 88 > OR = 60 mL/min/1.730mFinal   eGFR  Date Value Ref Range Status  12/02/2022 74 > OR = 60 mL/min/1.7384minal         Passed - Valid encounter within last 6 months    Recent Outpatient Visits           6 days ago Essential hypertension   [place]   3 months ago Encounter for Med[employer] Metals Companynual wellness exam   [facility]   4 months ago Essential hypertension   [facility]   8 months ago Essential hypertension   [place]   9 months ago Persistent cough   [facility]       Future Appointments             In 4 months PenReece PackerulMyna HidalgoNPMoradaECRoscoeIn 9 months PenReece PackerulMyna HidalgoNPNew [facility]

## 2022-12-09 ENCOUNTER — Other Ambulatory Visit: Payer: Self-pay

## 2022-12-13 ENCOUNTER — Other Ambulatory Visit: Payer: Self-pay

## 2022-12-13 ENCOUNTER — Other Ambulatory Visit: Payer: Self-pay | Admitting: Nurse Practitioner

## 2022-12-13 DIAGNOSIS — E11618 Type 2 diabetes mellitus with other diabetic arthropathy: Secondary | ICD-10-CM

## 2022-12-13 NOTE — Telephone Encounter (Signed)
Patient is in office stating that he is out of his Jardance and Metformin. Says that the pharmacy has sent a request but has not heard anything back. Please refill for he is again out of these.

## 2022-12-14 ENCOUNTER — Other Ambulatory Visit: Payer: Self-pay

## 2022-12-14 ENCOUNTER — Other Ambulatory Visit: Payer: Self-pay | Admitting: Emergency Medicine

## 2022-12-14 DIAGNOSIS — E11618 Type 2 diabetes mellitus with other diabetic arthropathy: Secondary | ICD-10-CM

## 2022-12-14 MED ORDER — EMPAGLIFLOZIN 25 MG PO TABS
25.0000 mg | ORAL_TABLET | Freq: Every day | ORAL | 1 refills | Status: DC
  Filled 2022-12-14: qty 90, 90d supply, fill #0
  Filled 2023-03-14: qty 30, 30d supply, fill #1
  Filled 2023-04-15: qty 30, 30d supply, fill #2
  Filled 2023-05-17: qty 30, 30d supply, fill #3

## 2022-12-14 MED ORDER — METFORMIN HCL 1000 MG PO TABS
1000.0000 mg | ORAL_TABLET | Freq: Two times a day (BID) | ORAL | 3 refills | Status: DC
  Filled 2022-12-14: qty 180, 90d supply, fill #0
  Filled 2023-03-14: qty 180, 90d supply, fill #1
  Filled 2023-06-12: qty 180, 90d supply, fill #2
  Filled 2023-09-14: qty 180, 90d supply, fill #3

## 2022-12-28 ENCOUNTER — Other Ambulatory Visit: Payer: Self-pay

## 2022-12-28 MED ORDER — ENTRESTO 24-26 MG PO TABS
1.0000 | ORAL_TABLET | Freq: Two times a day (BID) | ORAL | 5 refills | Status: AC
  Filled 2022-12-28: qty 60, 30d supply, fill #0
  Filled 2023-01-30: qty 60, 30d supply, fill #1
  Filled 2023-03-03: qty 60, 30d supply, fill #2
  Filled 2023-03-31: qty 60, 30d supply, fill #3
  Filled 2023-05-05: qty 60, 30d supply, fill #4

## 2023-01-30 ENCOUNTER — Other Ambulatory Visit: Payer: Self-pay

## 2023-03-03 ENCOUNTER — Other Ambulatory Visit: Payer: Self-pay

## 2023-03-14 ENCOUNTER — Other Ambulatory Visit: Payer: Self-pay

## 2023-03-15 ENCOUNTER — Other Ambulatory Visit: Payer: Self-pay

## 2023-03-15 ENCOUNTER — Other Ambulatory Visit: Payer: Self-pay | Admitting: Nurse Practitioner

## 2023-03-15 DIAGNOSIS — E11618 Type 2 diabetes mellitus with other diabetic arthropathy: Secondary | ICD-10-CM

## 2023-03-15 MED ORDER — SITAGLIPTIN PHOSPHATE 100 MG PO TABS
100.0000 mg | ORAL_TABLET | Freq: Every day | ORAL | 0 refills | Status: DC
  Filled 2023-03-15: qty 30, 30d supply, fill #0
  Filled 2023-04-17: qty 30, 30d supply, fill #1
  Filled 2023-05-17: qty 30, 30d supply, fill #2

## 2023-03-15 NOTE — Telephone Encounter (Signed)
Requested Prescriptions  Pending Prescriptions Disp Refills   sitaGLIPtin (JANUVIA) 100 MG tablet 90 tablet 3    Sig: TAKE 1 TABLET BY MOUTH DAILY.     Endocrinology:  Diabetes - DPP-4 Inhibitors Failed - 03/15/2023 11:46 AM      Failed - HBA1C is between 0 and 7.9 and within 180 days    Hemoglobin A1C  Date Value Ref Range Status  05/14/2019 8.4  Final   HbA1c, POC (prediabetic range)  Date Value Ref Range Status  09/09/2019 6.3 5.7 - 6.4 % Final   Hgb A1c MFr Bld  Date Value Ref Range Status  12/02/2022 8.1 (H) <5.7 % of total Hgb Final    Comment:    For someone without known diabetes, a hemoglobin A1c value of 6.5% or greater indicates that they may have  diabetes and this should be confirmed with a follow-up  test. . For someone with known diabetes, a value <7% indicates  that their diabetes is well controlled and a value  greater than or equal to 7% indicates suboptimal  control. A1c targets should be individualized based on  duration of diabetes, age, comorbid conditions, and  other considerations. . Currently, no consensus exists regarding use of hemoglobin A1c for diagnosis of diabetes for children. .          Passed - Cr in normal range and within 360 days    Creat  Date Value Ref Range Status  12/02/2022 1.06 0.70 - 1.28 mg/dL Final   Creatinine, Urine  Date Value Ref Range Status  08/01/2022 44 20 - 320 mg/dL Final         Passed - Valid encounter within last 6 months    Recent Outpatient Visits           3 months ago Essential hypertension   [facility] Berniece Salines, FNP   6 months ago Encounter for [employer] annual wellness exam   [facility] Margarita Mail, DO   7 months ago Essential hypertension   [facility] Berniece Salines, FNP   11 months ago Essential hypertension   [facility] Berniece Salines, FNP   1 year ago Persistent  cough   [facility] Berniece Salines, FNP       Future Appointments             In 2 weeks Zane Herald, Rudolpho Sevin, FNP [facility], [facility]   In 6 months Zane Herald, Rudolpho Sevin, FNP [facility], [facility]

## 2023-03-31 ENCOUNTER — Other Ambulatory Visit: Payer: Self-pay

## 2023-04-03 ENCOUNTER — Encounter: Payer: Self-pay | Admitting: Nurse Practitioner

## 2023-04-03 ENCOUNTER — Ambulatory Visit (INDEPENDENT_AMBULATORY_CARE_PROVIDER_SITE_OTHER): Payer: Medicare Other | Admitting: Nurse Practitioner

## 2023-04-03 ENCOUNTER — Other Ambulatory Visit: Payer: Self-pay

## 2023-04-03 VITALS — BP 128/74 | HR 96 | Temp 97.8°F | Resp 16 | Ht 68.0 in | Wt 170.7 lb

## 2023-04-03 DIAGNOSIS — I252 Old myocardial infarction: Secondary | ICD-10-CM

## 2023-04-03 DIAGNOSIS — Z7984 Long term (current) use of oral hypoglycemic drugs: Secondary | ICD-10-CM

## 2023-04-03 DIAGNOSIS — I429 Cardiomyopathy, unspecified: Secondary | ICD-10-CM

## 2023-04-03 DIAGNOSIS — I509 Heart failure, unspecified: Secondary | ICD-10-CM

## 2023-04-03 DIAGNOSIS — I10 Essential (primary) hypertension: Secondary | ICD-10-CM

## 2023-04-03 DIAGNOSIS — E11618 Type 2 diabetes mellitus with other diabetic arthropathy: Secondary | ICD-10-CM | POA: Diagnosis not present

## 2023-04-03 DIAGNOSIS — E785 Hyperlipidemia, unspecified: Secondary | ICD-10-CM | POA: Diagnosis not present

## 2023-04-03 DIAGNOSIS — I2510 Atherosclerotic heart disease of native coronary artery without angina pectoris: Secondary | ICD-10-CM | POA: Diagnosis not present

## 2023-04-03 NOTE — Assessment & Plan Note (Addendum)
He currently takes atorvastatin 40 mg daily, carvedilol 12.5 mg twice daily and Entresto 24-26 mg every 12 hours.he is also on jardiance 25 mg daily.

## 2023-04-03 NOTE — Assessment & Plan Note (Signed)
jardiance 25 mg daily, januvia 100 mg daily, metformin 1000 mg BID, does not watch his diet

## 2023-04-03 NOTE — Assessment & Plan Note (Signed)
Currently takes atorvastatin 40 mg daily

## 2023-04-03 NOTE — Assessment & Plan Note (Signed)
He currently takes atorvastatin 40 mg daily, carvedilol 12.5 mg twice daily and Entresto 24-26 mg every 12 hours.he is also on jardiance 25 mg daily.

## 2023-04-03 NOTE — Progress Notes (Signed)
BP 128/74   Pulse 96   Temp 97.8 F (36.6 C) (Oral)   Resp 16   Ht 5\' 8"  (1.727 m)   Wt 170 lb 11.2 oz (77.4 kg)   SpO2 98%   BMI 25.95 kg/m    Subjective:    Patient ID: Maurice Fischer, male    DOB: [birth year], 72 y.o.   MRN: [id number]  HPI: Maurice Fischer is a 72 y.o. male  Chief Complaint  Patient presents with   Hypertension   Diabetes   Hyperlipidemia    4 month follow up   Hypertension:  -Medications: carvedilol 12.5 mg BID, entresto 24-26 mg daily and spironolactone 12.5 mg daily -Patient is compliant with above medications and reports no side effects. -Checking BP at home (average): 120/70s He says he has has a few low pressure readings.  He says when it happens he does feel light headed.  Discussed that we may need to reduce his carvedilol.   -Denies any SOB, CP, vision changes, LE edema     04/03/2023    8:16 AM 12/02/2022    7:58 AM 09/06/2022    3:08 PM  Vitals with BMI  Height 5\' 8"  5\' 8"  5\' 8"   Weight 170 lbs 11 oz 173 lbs 174 lbs 6 oz  BMI 25.96 26.31 26.52  Systolic 128 124 409  Diastolic 74 72 62  Pulse 96 71 93      HLD:  -Medications: atorvastatin 40 mg daily -Patient is compliant with above medications and reports no side effects.  -Last lipid panel:  Lipid Panel     Component Value Date/Time   CHOL 126 12/02/2022 0814   CHOL 134 09/16/2015 0836   TRIG 146 12/02/2022 0814   HDL 41 12/02/2022 0814   HDL 47 09/16/2015 0836   CHOLHDL 3.1 12/02/2022 0814   VLDL 28 03/27/2017 0817   LDLCALC 62 12/02/2022 0814   LABVLDL 25 09/16/2015 0836      Diabetes, Type 2:  -Last A1c 8.1 -Medications: jardiance 25 mg daily, januvia 100 mg daily, metformin 1000 mg BID -Patient is compliant with the above medications and reports no side effects.  -Checking BG at home: not regularly -Fasting home BG: 150 s -Diet: he eats what he wants and is not going to change that -Eye exam: up to date -Foot exam: due -Microalbumin: up to  date -Statin: yes -PNA vaccine: yes -Denies symptoms of hypoglycemia, polyuria, polydipsia, numbness extremities, foot ulcers/trauma.    History of MI/ angina/CAD/ cardiomyopathy/heart failure: He is established with Dr. Juliann Pares.  He last saw cardiology on 11/29/2022. No changes.  He currently takes atorvastatin 40 mg daily, carvedilol 12.5 mg twice daily and Entresto 24-26 mg every 12 hours, jardiance 25 mg daily.   Cardiac cath with 06/17/2021.  Last echo was done November 15, 2021.  Cardiology has recommended defibrillator.  Patient is deferring at this time.    Echo showed: SEVERE LV SYSTOLIC DYSFUNCTION NORMAL RIGHT VENTRICULAR SYSTOLIC FUNCTION NO VALVULAR STENOSIS MILD to MODERATE MR TRIVIAL TR MILD PR EF 25% Globally drepressed LVF Grade I diastolic disfunction   Relevant past medical, surgical, family and social history reviewed and updated as indicated. Interim medical history since our last visit reviewed. Allergies and medications reviewed and updated.  Review of Systems  Constitutional: Negative for fever or weight change.  Respiratory: Negative for cough and shortness of breath.   Cardiovascular: Negative for chest pain or palpitations.  Gastrointestinal: Negative for abdominal pain, no bowel changes.  Musculoskeletal: Negative for gait problem or joint swelling.  Skin: Negative for rash.  Neurological: Negative for dizziness or headache.  No other specific complaints in a complete review of systems (except as listed in HPI above).      Objective:    BP 128/74   Pulse 96   Temp 97.8 F (36.6 C) (Oral)   Resp 16   Ht 5\' 8"  (1.727 m)   Wt 170 lb 11.2 oz (77.4 kg)   SpO2 98%   BMI 25.95 kg/m   Wt Readings from Last 3 Encounters:  04/03/23 170 lb 11.2 oz (77.4 kg)  12/02/22 173 lb (78.5 kg)  09/06/22 174 lb 6.4 oz (79.1 kg)    Physical Exam  Constitutional: Patient appears well-developed and well-nourished.  No distress.  HEENT: head atraumatic,  normocephalic, pupils equal and reactive to light,  neck supple Cardiovascular: Normal rate, regular rhythm and normal heart sounds.  No murmur heard. No BLE edema. Pulmonary/Chest: Effort normal and breath sounds normal. No respiratory distress. Abdominal: Soft.  There is no tenderness. Psychiatric: Patient has a normal mood and affect. behavior is normal. Judgment and thought content normal.  Diabetic Foot Exam - Simple   Simple Foot Form Diabetic Foot exam was performed with the following findings: Yes 04/03/2023  8:24 AM  Visual Inspection No deformities, no ulcerations, no other skin breakdown bilaterally: Yes Sensation Testing Intact to touch and monofilament testing bilaterally: Yes Pulse Check Posterior Tibialis and Dorsalis pulse intact bilaterally: Yes Comments      Results for orders placed or performed in visit on 12/02/22  Hemoglobin A1c  Result Value Ref Range   Hgb A1c MFr Bld 8.1 (H) <5.7 % of total Hgb   Mean Plasma Glucose 186 mg/dL   eAG (mmol/L) 16.1 mmol/L  CBC with Differential/Platelet  Result Value Ref Range   WBC 7.1 3.8 - 10.8 Thousand/uL   RBC 5.04 4.20 - 5.80 Million/uL   Hemoglobin 15.2 13.2 - 17.1 g/dL   HCT 09.6 04.5 - 40.9 %   MCV 88.5 80.0 - 100.0 fL   MCH 30.2 27.0 - 33.0 pg   MCHC 34.1 32.0 - 36.0 g/dL   RDW 81.1 91.4 - 78.2 %   Platelets 220 140 - 400 Thousand/uL   MPV 9.3 7.5 - 12.5 fL   Neutro Abs 3,742 1,500 - 7,800 cells/uL   Lymphs Abs 2,251 850 - 3,900 cells/uL   Absolute Monocytes 561 200 - 950 cells/uL   Eosinophils Absolute 504 (H) 15 - 500 cells/uL   Basophils Absolute 43 0 - 200 cells/uL   Neutrophils Relative % 52.7 %   Total Lymphocyte 31.7 %   Monocytes Relative 7.9 %   Eosinophils Relative 7.1 %   Basophils Relative 0.6 %  COMPLETE METABOLIC PANEL WITH GFR  Result Value Ref Range   Glucose, Bld 159 (H) 65 - 99 mg/dL   BUN 21 7 - 25 mg/dL   Creat 9.56 2.13 - 0.86 mg/dL   eGFR 74 > OR = 60 VH/QIO/9.62X5    BUN/Creatinine Ratio SEE NOTE: 6 - 22 (calc)   Sodium 137 135 - 146 mmol/L   Potassium 4.9 3.5 - 5.3 mmol/L   Chloride 101 98 - 110 mmol/L   CO2 28 20 - 32 mmol/L   Calcium 10.0 8.6 - 10.3 mg/dL   Total Protein 6.7 6.1 - 8.1 g/dL   Albumin 4.3 3.6 - 5.1 g/dL   Globulin 2.4 1.9 - 3.7 g/dL (calc)   AG Ratio 1.8  1.0 - 2.5 (calc)   Total Bilirubin 0.7 0.2 - 1.2 mg/dL   Alkaline phosphatase (APISO) 42 35 - 144 U/L   AST 17 10 - 35 U/L   ALT 24 9 - 46 U/L  Lipid panel  Result Value Ref Range   Cholesterol 126 <200 mg/dL   HDL 41 > OR = 40 mg/dL   Triglycerides 161 <096 mg/dL   LDL Cholesterol (Calc) 62 mg/dL (calc)   Total CHOL/HDL Ratio 3.1 <5.0 (calc)   Non-HDL Cholesterol (Calc) 85 <045 mg/dL (calc)      Assessment & Plan:   Problem List Items Addressed This Visit       Cardiovascular and Mediastinum   Coronary artery disease involving native coronary artery of native heart without angina pectoris (Chronic)     He currently takes atorvastatin 40 mg daily, carvedilol 12.5 mg twice daily and Entresto 24-26 mg every 12 hours.he is also on jardiance 25 mg daily.       Atherosclerosis of coronary artery     He currently takes atorvastatin 40 mg daily, carvedilol 12.5 mg twice daily and Entresto 24-26 mg every 12 hours.he is also on jardiance 25 mg daily.       Relevant Orders   COMPLETE METABOLIC PANEL WITH GFR   Lipid panel   Essential hypertension - Primary    carvedilol 12.5 mg BID, entresto 24-26 mg daily and spironolactone 12.5 mg daily      Relevant Orders   CBC with Differential/Platelet   COMPLETE METABOLIC PANEL WITH GFR   Heart failure (HCC)     He currently takes atorvastatin 40 mg daily, carvedilol 12.5 mg twice daily and Entresto 24-26 mg every 12 hours.he is also on jardiance 25 mg daily.       Relevant Orders   COMPLETE METABOLIC PANEL WITH GFR   Lipid panel   Cardiomyopathy (HCC)     He currently takes atorvastatin 40 mg daily, carvedilol 12.5 mg twice  daily and Entresto 24-26 mg every 12 hours.he is also on jardiance 25 mg daily.       Relevant Orders   COMPLETE METABOLIC PANEL WITH GFR   Lipid panel     Endocrine   Diabetes mellitus type II, controlled (HCC)     jardiance 25 mg daily, januvia 100 mg daily, metformin 1000 mg BID, does not watch his diet      Relevant Orders   COMPLETE METABOLIC PANEL WITH GFR   Hemoglobin A1c     Other   Hyperlipidemia LDL goal <70 (Chronic)    Currently takes atorvastatin 40 mg daily      Relevant Orders   COMPLETE METABOLIC PANEL WITH GFR   Lipid panel   History of MI (myocardial infarction)     He currently takes atorvastatin 40 mg daily, carvedilol 12.5 mg twice daily and Entresto 24-26 mg every 12 hours.he is also on jardiance 25 mg daily.       Relevant Orders   COMPLETE METABOLIC PANEL WITH GFR   Lipid panel      Follow up plan: Return in about 6 months (around 10/03/2023) for follow up.

## 2023-04-03 NOTE — Assessment & Plan Note (Signed)
carvedilol 12.5 mg BID, entresto 24-26 mg daily and spironolactone 12.5 mg daily

## 2023-04-10 ENCOUNTER — Ambulatory Visit: Payer: Medicare Other | Admitting: Nurse Practitioner

## 2023-04-16 ENCOUNTER — Other Ambulatory Visit: Payer: Self-pay

## 2023-04-17 ENCOUNTER — Other Ambulatory Visit: Payer: Self-pay | Admitting: Nurse Practitioner

## 2023-04-17 ENCOUNTER — Other Ambulatory Visit: Payer: Self-pay

## 2023-04-17 DIAGNOSIS — I2510 Atherosclerotic heart disease of native coronary artery without angina pectoris: Secondary | ICD-10-CM

## 2023-04-17 DIAGNOSIS — E785 Hyperlipidemia, unspecified: Secondary | ICD-10-CM

## 2023-04-18 ENCOUNTER — Other Ambulatory Visit: Payer: Self-pay

## 2023-04-18 MED FILL — Atorvastatin Calcium Tab 40 MG (Base Equivalent): ORAL | 90 days supply | Qty: 90 | Fill #0 | Status: AC

## 2023-04-18 NOTE — Telephone Encounter (Signed)
Requested Prescriptions  Pending Prescriptions Disp Refills   atorvastatin (LIPITOR) 40 MG tablet 90 tablet 0    Sig: TAKE 1 TABLET (40 MG TOTAL) BY MOUTH DAILY.     Cardiovascular:  Antilipid - Statins Failed - 04/17/2023  2:00 PM      Failed - Lipid Panel in normal range within the last 12 months    Cholesterol, Total  Date Value Ref Range Status  09/16/2015 134 100 - 199 mg/dL Final   Cholesterol  Date Value Ref Range Status  12/02/2022 126 <200 mg/dL Final   LDL Cholesterol (Calc)  Date Value Ref Range Status  12/02/2022 62 mg/dL (calc) Final    Comment:    Reference range: <100 . Desirable range <100 mg/dL for primary prevention;   <70 mg/dL for patients with CHD or diabetic patients  with > or = 2 CHD risk factors. [name] LDL-C is now calculated using the Martin-Hopkins  calculation, which is a validated novel method providing  better accuracy than the Friedewald equation in the  estimation of LDL-C.  Horald Pollen et al. Lenox Ahr. 8413;244(01): 2061-2068  (http://education.[employer].com/faq/FAQ164)    HDL  Date Value Ref Range Status  12/02/2022 41 > OR = 40 mg/dL Final  02/72/5366 47 >44 mg/dL Final   Triglycerides  Date Value Ref Range Status  12/02/2022 146 <150 mg/dL Final         Passed - Patient is not pregnant      Passed - Valid encounter within last 12 months    Recent Outpatient Visits           2 weeks ago Essential hypertension   [facility] Berniece Salines, FNP   4 months ago Essential hypertension   [facility] Berniece Salines, FNP   7 months ago Encounter for [employer] annual wellness exam   [facility] Margarita Mail, DO   8 months ago Essential hypertension   [facility] Berniece Salines, FNP   1 year ago Essential hypertension   [facility] Berniece Salines, FNP       Future Appointments              In 3 months Zane Herald, Rudolpho Sevin, FNP [facility], [facility]   In 4 months Zane Herald, Rudolpho Sevin, FNP [facility], [facility]

## 2023-04-20 ENCOUNTER — Other Ambulatory Visit: Payer: Self-pay

## 2023-04-21 ENCOUNTER — Other Ambulatory Visit: Payer: Self-pay | Admitting: Nurse Practitioner

## 2023-04-21 ENCOUNTER — Other Ambulatory Visit: Payer: Self-pay

## 2023-04-21 DIAGNOSIS — I2510 Atherosclerotic heart disease of native coronary artery without angina pectoris: Secondary | ICD-10-CM

## 2023-04-21 DIAGNOSIS — E11618 Type 2 diabetes mellitus with other diabetic arthropathy: Secondary | ICD-10-CM

## 2023-04-21 DIAGNOSIS — E785 Hyperlipidemia, unspecified: Secondary | ICD-10-CM

## 2023-04-21 NOTE — Telephone Encounter (Signed)
Medication Refill - Medication: atorvastatin (LIPITOR) 40 MG tablet , glucose blood test strip   Has the patient contacted their pharmacy? No. No, more refills.   (Agent: If no, request that the patient contact the pharmacy for the refill. If patient does not wish to contact the pharmacy document the reason why and proceed with request.)   Preferred Pharmacy (with phone number or street name):  [facility]  [address] [place] [address]  Phone: [phone] Fax: [phone]  Hours: M-F 7:30a-5:30p   Has the patient been seen for an appointment in the last year OR does the patient have an upcoming appointment? Yes.    Agent: Please be advised that RX refills may take up to 3 business days. We ask that you follow-up with your pharmacy.

## 2023-04-24 NOTE — Telephone Encounter (Signed)
Rx- test strips 08/11/22 #100 3RF- too soon       Atorvastatin 04/18/23 #90 Requested Prescriptions  Pending Prescriptions Disp Refills   atorvastatin (LIPITOR) 40 MG tablet 90 tablet 0    Sig: TAKE 1 TABLET (40 MG TOTAL) BY MOUTH DAILY.     Cardiovascular:  Antilipid - Statins Failed - 04/21/2023  9:36 AM      Failed - Lipid Panel in normal range within the last 12 months    Cholesterol, Total  Date Value Ref Range Status  09/16/2015 134 100 - 199 mg/dL Final   Cholesterol  Date Value Ref Range Status  12/02/2022 126 <200 mg/dL Final   LDL Cholesterol (Calc)  Date Value Ref Range Status  12/02/2022 62 mg/dL (calc) Final    Comment:    Reference range: <100 . Desirable range <100 mg/dL for primary prevention;   <70 mg/dL for patients with CHD or diabetic patients  with > or = 2 CHD risk factors. [name] LDL-C is now calculated using the Martin-Hopkins  calculation, which is a validated novel method providing  better accuracy than the Friedewald equation in the  estimation of LDL-C.  Horald Pollen et al. Lenox Ahr. 1610;960(45): 2061-2068  (http://education.[facility].com/faq/FAQ164)    HDL  Date Value Ref Range Status  12/02/2022 41 > OR = 40 mg/dL Final  40/98/1191 47 >47 mg/dL Final   Triglycerides  Date Value Ref Range Status  12/02/2022 146 <150 mg/dL Final         Passed - Patient is not pregnant      Passed - Valid encounter within last 12 months    Recent Outpatient Visits           3 weeks ago Essential hypertension   [facility] Berniece Salines, FNP   4 months ago Essential hypertension   [facility] Berniece Salines, FNP   7 months ago Encounter for [employer] annual wellness exam   [facility] Margarita Mail, DO   8 months ago Essential hypertension   [facility] Berniece Salines, FNP   1 year ago Essential hypertension   [facility] Berniece Salines, FNP       Future Appointments             In 3 months Zane Herald, Rudolpho Sevin, FNP [facility], [facility]   In 4 months Zane Herald, Rudolpho Sevin, FNP [facility], [facility]             glucose blood test strip 100 each 3    Sig: Use as directed to monitor blood sugars once a day     Endocrinology: Diabetes - Testing Supplies Passed - 04/21/2023  9:36 AM      Passed - Valid encounter within last 12 months    Recent Outpatient Visits           3 weeks ago Essential hypertension   [facility] Berniece Salines, FNP   4 months ago Essential hypertension   [facility] Berniece Salines, FNP   7 months ago Encounter for [employer] annual wellness exam   [facility] Margarita Mail, DO   8 months ago Essential hypertension   [facility] Berniece Salines, FNP   1 year ago Essential hypertension   [facility] Berniece Salines, [place]  Future Appointments             In 3 months Zane Herald, Rudolpho Sevin, FNP [facility], [facility]   In 4 months Zane Herald, Rudolpho Sevin, FNP [facility], [facility]

## 2023-05-02 ENCOUNTER — Other Ambulatory Visit: Payer: Self-pay

## 2023-05-05 ENCOUNTER — Other Ambulatory Visit: Payer: Self-pay

## 2023-05-17 ENCOUNTER — Other Ambulatory Visit: Payer: Self-pay

## 2023-05-25 ENCOUNTER — Other Ambulatory Visit: Payer: Self-pay

## 2023-05-29 ENCOUNTER — Other Ambulatory Visit: Payer: Self-pay

## 2023-06-05 DIAGNOSIS — I420 Dilated cardiomyopathy: Secondary | ICD-10-CM | POA: Diagnosis not present

## 2023-06-05 DIAGNOSIS — I429 Cardiomyopathy, unspecified: Secondary | ICD-10-CM | POA: Diagnosis not present

## 2023-06-05 DIAGNOSIS — I5022 Chronic systolic (congestive) heart failure: Secondary | ICD-10-CM | POA: Diagnosis not present

## 2023-06-05 DIAGNOSIS — E119 Type 2 diabetes mellitus without complications: Secondary | ICD-10-CM | POA: Diagnosis not present

## 2023-06-05 DIAGNOSIS — I5020 Unspecified systolic (congestive) heart failure: Secondary | ICD-10-CM | POA: Diagnosis not present

## 2023-06-05 DIAGNOSIS — E7800 Pure hypercholesterolemia, unspecified: Secondary | ICD-10-CM | POA: Diagnosis not present

## 2023-06-05 DIAGNOSIS — I10 Essential (primary) hypertension: Secondary | ICD-10-CM | POA: Diagnosis not present

## 2023-06-05 DIAGNOSIS — Z87891 Personal history of nicotine dependence: Secondary | ICD-10-CM | POA: Diagnosis not present

## 2023-06-05 DIAGNOSIS — I509 Heart failure, unspecified: Secondary | ICD-10-CM | POA: Diagnosis not present

## 2023-06-05 DIAGNOSIS — I2510 Atherosclerotic heart disease of native coronary artery without angina pectoris: Secondary | ICD-10-CM | POA: Diagnosis not present

## 2023-06-05 DIAGNOSIS — R0609 Other forms of dyspnea: Secondary | ICD-10-CM | POA: Diagnosis not present

## 2023-06-06 ENCOUNTER — Other Ambulatory Visit: Payer: Self-pay

## 2023-06-12 ENCOUNTER — Other Ambulatory Visit: Payer: Self-pay | Admitting: Nurse Practitioner

## 2023-06-12 ENCOUNTER — Other Ambulatory Visit: Payer: Self-pay

## 2023-06-12 DIAGNOSIS — E11618 Type 2 diabetes mellitus with other diabetic arthropathy: Secondary | ICD-10-CM

## 2023-06-13 NOTE — Telephone Encounter (Signed)
Requested medication (s) are due for refill today: na   Requested medication (s) are on the active medication list: yes   Last refill:  jardiance- 12/14/22- 12/13/22 #90 1 refills , Alma Friendly- 03/15/23- 03/12/24 #90 0 refills   Future visit scheduled: yes in 1 month   Notes to clinic:   protocol failed last labs 11/25/22. Last dispensed medication doc. 05/18/23. Do you want to refill Rx?     Requested Prescriptions  Pending Prescriptions Disp Refills   empagliflozin (JARDIANCE) 25 MG TABS tablet 90 tablet 1    Sig: Take 1 tablet (25 mg total) by mouth daily before breakfast.     Endocrinology:  Diabetes - SGLT2 Inhibitors Failed - 06/12/2023  9:57 AM      Failed - HBA1C is between 0 and 7.9 and within 180 days    Hemoglobin A1C  Date Value Ref Range Status  05/14/2019 8.4  Final   HbA1c, POC (prediabetic range)  Date Value Ref Range Status  09/09/2019 6.3 5.7 - 6.4 % Final   Hgb A1c MFr Bld  Date Value Ref Range Status  12/02/2022 8.1 (H) <5.7 % of total Hgb Final    Comment:    For someone without known diabetes, a hemoglobin A1c value of 6.5% or greater indicates that they may have  diabetes and this should be confirmed with a follow-up  test. . For someone with known diabetes, a value <7% indicates  that their diabetes is well controlled and a value  greater than or equal to 7% indicates suboptimal  control. A1c targets should be individualized based on  duration of diabetes, age, comorbid conditions, and  other considerations. . Currently, no consensus exists regarding use of hemoglobin A1c for diagnosis of diabetes for children. .          Passed - Cr in normal range and within 360 days    Creat  Date Value Ref Range Status  12/02/2022 1.06 0.70 - 1.28 mg/dL Final   Creatinine, Urine  Date Value Ref Range Status  08/01/2022 44 20 - 320 mg/dL Final         Passed - eGFR in normal range and within 360 days    GFR, Est African American  Date Value Ref Range  Status  02/19/2021 102 > OR = 60 mL/min/1.9m2 Final   GFR, Est Non African American  Date Value Ref Range Status  02/19/2021 88 > OR = 60 mL/min/1.39m2 Final   eGFR  Date Value Ref Range Status  12/02/2022 74 > OR = 60 mL/min/1.4m2 Final         Passed - Valid encounter within last 6 months    Recent Outpatient Visits           2 months ago Essential hypertension   [facility] Berniece Salines, FNP   6 months ago Essential hypertension   [facility] Berniece Salines, FNP   9 months ago Encounter for [employer] annual wellness exam   [facility] Margarita Mail, DO   10 months ago Essential hypertension   [facility] Berniece Salines, FNP   1 year ago Essential hypertension   [facility] Berniece Salines, FNP       Future Appointments             In 1 month Zane Herald, Rudolpho Sevin, FNP [facility], [facility]   In 3 months [place],  Rudolpho Sevin, FNP [facility], [facility]             sitaGLIPtin (JANUVIA) 100 MG tablet 90 tablet 0    Sig: Take 1 tablet (100 mg total) by mouth daily.     Endocrinology:  Diabetes - DPP-4 Inhibitors Failed - 06/12/2023  9:57 AM      Failed - HBA1C is between 0 and 7.9 and within 180 days    Hemoglobin A1C  Date Value Ref Range Status  05/14/2019 8.4  Final   HbA1c, POC (prediabetic range)  Date Value Ref Range Status  09/09/2019 6.3 5.7 - 6.4 % Final   Hgb A1c MFr Bld  Date Value Ref Range Status  12/02/2022 8.1 (H) <5.7 % of total Hgb Final    Comment:    For someone without known diabetes, a hemoglobin A1c value of 6.5% or greater indicates that they may have  diabetes and this should be confirmed with a follow-up  test. . For someone with known diabetes, a value <7% indicates  that their diabetes is well controlled and a value  greater than or equal to 7%  indicates suboptimal  control. A1c targets should be individualized based on  duration of diabetes, age, comorbid conditions, and  other considerations. . Currently, no consensus exists regarding use of hemoglobin A1c for diagnosis of diabetes for children. .          Passed - Cr in normal range and within 360 days    Creat  Date Value Ref Range Status  12/02/2022 1.06 0.70 - 1.28 mg/dL Final   Creatinine, Urine  Date Value Ref Range Status  08/01/2022 44 20 - 320 mg/dL Final         Passed - Valid encounter within last 6 months    Recent Outpatient Visits           2 months ago Essential hypertension   [facility] Berniece Salines, FNP   6 months ago Essential hypertension   [facility] Berniece Salines, FNP   9 months ago Encounter for [employer] annual wellness exam   [facility] Margarita Mail, DO   10 months ago Essential hypertension   [facility] Berniece Salines, FNP   1 year ago Essential hypertension   [facility] Berniece Salines, FNP       Future Appointments             In 1 month Zane Herald, Rudolpho Sevin, FNP [facility], [facility]   In 3 months Zane Herald, Rudolpho Sevin, FNP [facility], [facility]

## 2023-06-14 ENCOUNTER — Other Ambulatory Visit: Payer: Self-pay

## 2023-06-14 MED ORDER — SITAGLIPTIN PHOSPHATE 100 MG PO TABS
100.0000 mg | ORAL_TABLET | Freq: Every day | ORAL | 0 refills | Status: DC
Start: 2023-06-14 — End: 2023-09-14
  Filled 2023-06-14: qty 90, 90d supply, fill #0

## 2023-06-14 MED ORDER — EMPAGLIFLOZIN 25 MG PO TABS
25.0000 mg | ORAL_TABLET | Freq: Every day | ORAL | 1 refills | Status: DC
Start: 2023-06-14 — End: 2023-12-05
  Filled 2023-06-14: qty 90, 90d supply, fill #0
  Filled 2023-09-14: qty 90, 90d supply, fill #1

## 2023-06-14 NOTE — Telephone Encounter (Signed)
Pt had a follow up in June 2024

## 2023-06-19 ENCOUNTER — Other Ambulatory Visit: Payer: Self-pay

## 2023-06-19 MED ORDER — NITROGLYCERIN 0.4 MG SL SUBL
0.4000 mg | SUBLINGUAL_TABLET | SUBLINGUAL | 11 refills | Status: AC | PRN
  Filled 2023-06-19: qty 25, 8d supply, fill #0
  Filled 2023-11-28: qty 25, 8d supply, fill #1
  Filled 2024-05-20: qty 25, 8d supply, fill #2

## 2023-07-05 ENCOUNTER — Other Ambulatory Visit: Payer: Self-pay

## 2023-07-05 MED ORDER — COMIRNATY 30 MCG/0.3ML IM SUSY
0.3000 mL | PREFILLED_SYRINGE | Freq: Once | INTRAMUSCULAR | 0 refills | Status: AC
  Filled 2023-07-05: qty 0.3, 1d supply, fill #0

## 2023-07-05 MED ORDER — INFLUENZA VAC A&B SURF ANT ADJ 0.5 ML IM SUSY
0.5000 mL | PREFILLED_SYRINGE | Freq: Once | INTRAMUSCULAR | 0 refills | Status: AC
  Filled 2023-07-05: qty 0.5, 1d supply, fill #0

## 2023-07-17 ENCOUNTER — Other Ambulatory Visit: Payer: Self-pay

## 2023-07-17 ENCOUNTER — Other Ambulatory Visit: Payer: Self-pay | Admitting: Nurse Practitioner

## 2023-07-17 DIAGNOSIS — I2510 Atherosclerotic heart disease of native coronary artery without angina pectoris: Secondary | ICD-10-CM

## 2023-07-17 DIAGNOSIS — E785 Hyperlipidemia, unspecified: Secondary | ICD-10-CM

## 2023-07-17 MED ORDER — SPIRONOLACTONE 25 MG PO TABS
12.5000 mg | ORAL_TABLET | Freq: Every day | ORAL | 3 refills | Status: DC
  Filled 2023-07-17: qty 45, 90d supply, fill #0
  Filled 2023-10-19: qty 45, 90d supply, fill #1
  Filled 2024-01-16: qty 45, 90d supply, fill #2
  Filled 2024-04-17: qty 45, 90d supply, fill #3

## 2023-07-18 ENCOUNTER — Other Ambulatory Visit: Payer: Self-pay | Admitting: Nurse Practitioner

## 2023-07-18 ENCOUNTER — Other Ambulatory Visit: Payer: Self-pay

## 2023-07-18 DIAGNOSIS — E785 Hyperlipidemia, unspecified: Secondary | ICD-10-CM

## 2023-07-18 DIAGNOSIS — I2510 Atherosclerotic heart disease of native coronary artery without angina pectoris: Secondary | ICD-10-CM

## 2023-07-19 ENCOUNTER — Other Ambulatory Visit: Payer: Self-pay

## 2023-07-19 ENCOUNTER — Other Ambulatory Visit: Payer: Self-pay | Admitting: Emergency Medicine

## 2023-07-19 ENCOUNTER — Other Ambulatory Visit: Payer: Self-pay | Admitting: Nurse Practitioner

## 2023-07-19 DIAGNOSIS — I2510 Atherosclerotic heart disease of native coronary artery without angina pectoris: Secondary | ICD-10-CM

## 2023-07-19 DIAGNOSIS — E785 Hyperlipidemia, unspecified: Secondary | ICD-10-CM

## 2023-07-19 MED ORDER — ATORVASTATIN CALCIUM 40 MG PO TABS
40.0000 mg | ORAL_TABLET | Freq: Every day | ORAL | 0 refills | Status: DC
Start: 2023-07-19 — End: 2023-10-17
  Filled 2023-07-19 (×2): qty 90, 90d supply, fill #0

## 2023-08-03 ENCOUNTER — Other Ambulatory Visit: Payer: Self-pay

## 2023-08-03 MED ORDER — CARVEDILOL 12.5 MG PO TABS
12.5000 mg | ORAL_TABLET | Freq: Two times a day (BID) | ORAL | 7 refills | Status: DC
  Filled 2023-08-03: qty 60, 30d supply, fill #0
  Filled 2023-08-04: qty 90, 45d supply, fill #0
  Filled 2023-09-18: qty 90, 45d supply, fill #1
  Filled 2023-11-09: qty 90, 45d supply, fill #2
  Filled 2023-12-21: qty 90, 45d supply, fill #3
  Filled 2024-02-14: qty 90, 45d supply, fill #4
  Filled 2024-04-01: qty 90, 45d supply, fill #5
  Filled 2024-05-20: qty 90, 45d supply, fill #6
  Filled 2024-07-04: qty 90, 45d supply, fill #7

## 2023-08-04 ENCOUNTER — Ambulatory Visit (INDEPENDENT_AMBULATORY_CARE_PROVIDER_SITE_OTHER): Payer: Medicare Other | Admitting: Nurse Practitioner

## 2023-08-04 ENCOUNTER — Other Ambulatory Visit: Payer: Self-pay

## 2023-08-04 ENCOUNTER — Encounter: Payer: Self-pay | Admitting: Nurse Practitioner

## 2023-08-04 VITALS — BP 118/64 | HR 72 | Temp 98.1°F | Resp 16 | Ht 68.0 in | Wt 170.5 lb

## 2023-08-04 DIAGNOSIS — I252 Old myocardial infarction: Secondary | ICD-10-CM | POA: Diagnosis not present

## 2023-08-04 DIAGNOSIS — I10 Essential (primary) hypertension: Secondary | ICD-10-CM | POA: Diagnosis not present

## 2023-08-04 DIAGNOSIS — I429 Cardiomyopathy, unspecified: Secondary | ICD-10-CM

## 2023-08-04 DIAGNOSIS — E785 Hyperlipidemia, unspecified: Secondary | ICD-10-CM

## 2023-08-04 DIAGNOSIS — I509 Heart failure, unspecified: Secondary | ICD-10-CM | POA: Diagnosis not present

## 2023-08-04 DIAGNOSIS — I2510 Atherosclerotic heart disease of native coronary artery without angina pectoris: Secondary | ICD-10-CM | POA: Diagnosis not present

## 2023-08-04 DIAGNOSIS — Z7984 Long term (current) use of oral hypoglycemic drugs: Secondary | ICD-10-CM

## 2023-08-04 DIAGNOSIS — E1165 Type 2 diabetes mellitus with hyperglycemia: Secondary | ICD-10-CM

## 2023-08-04 NOTE — Assessment & Plan Note (Signed)
Diabetes is uncontrolled, patient takes his medications as prescribed but eats whatever he wants. He reports his heart will give out before the sugar will kill him. He is aware of the risks associated with uncontrolled diabetes.

## 2023-08-04 NOTE — Assessment & Plan Note (Signed)
Managed by cardiology  He currently takes atorvastatin 40 mg daily, carvedilol 12.5 mg twice daily and Entresto 24-26 mg he is also on jardiance 25 mg daily,

## 2023-08-04 NOTE — Assessment & Plan Note (Signed)
Continue atorvastatin 40 mg daily.  

## 2023-08-04 NOTE — Progress Notes (Signed)
BP 118/64   Pulse 72   Temp 98.1 F (36.7 C) (Oral)   Resp 16   Ht 5\' 8"  (1.727 m)   Wt 170 lb 8 oz (77.3 kg)   SpO2 99%   BMI 25.92 kg/m    Subjective:    Patient ID: Maurice Fischer, male    DOB: 1951/09/01, 72 y.o.   MRN: [id number]  HPI: Maurice Fischer is a 72 y.o. male  Chief Complaint  Patient presents with   Medical Management of Chronic Issues   Hypertension/History of MI/ angina/CAD/ cardiomyopathy/heart failure: He is established with Dr. Juliann Pares.  He currently takes atorvastatin 40 mg daily, carvedilol 12.5 mg twice daily and Entresto 24-26 mg every 12 hours, jardiance 25 mg daily.   Cardiac cath with 06/17/2021.  Last echo was done November 15, 2021.  Cardiology has recommended defibrillator.  Patient is deferring at this time. He last saw cardiology on 06/05/2023.  No changes at this time.     Echo showed: SEVERE LV SYSTOLIC DYSFUNCTION NORMAL RIGHT VENTRICULAR SYSTOLIC FUNCTION NO VALVULAR STENOSIS MILD to MODERATE MR TRIVIAL TR MILD PR EF 25% Globally drepressed LVF Grade I diastolic disfunction:  -Medications: spironolactone 12.5 mg daily, entresto 24-26 mg daily and carvedilol 12.5 mg BID -Patient is compliant with above medications and reports no side effects. -Checking BP at home (average): 100s -Denies any SOB, CP, vision changes, LE edema or symptoms of hypotension -Diet: recommend DASH diet  -Exercise: recommend 150 min of physical activity weekly    Last appointment patient had reports a few low pressure readings. He said that when that happened he would feel light headed. He reports that cardiology is aware, but does not want to change his medication.        08/04/2023    8:11 AM 04/03/2023    8:16 AM 12/02/2022    7:58 AM  Vitals with BMI  Height 5\' 8"  5\' 8"  5\' 8"   Weight 170 lbs 8 oz 170 lbs 11 oz 173 lbs  BMI 25.93 25.96 26.31  Systolic 118 128 782  Diastolic 64 74 72  Pulse 72 96 71     HLD:  -Medications: atorvastatin 40 mg  daily -Patient is compliant with above medications and reports no side effects.  -Last lipid panel:  Lipid Panel     Component Value Date/Time   CHOL 126 12/02/2022 0814   CHOL 134 09/16/2015 0836   TRIG 146 12/02/2022 0814   HDL 41 12/02/2022 0814   HDL 47 09/16/2015 0836   CHOLHDL 3.1 12/02/2022 0814   VLDL 28 03/27/2017 0817   LDLCALC 62 12/02/2022 0814   LABVLDL 25 09/16/2015 0836    Diabetes, Type 2:  -Last A1c 8.1,  -Medications:  jardiance 25 mg daily, januvia 100 mg daily, metformin 1000 mg BID -Patient is compliant with the above medications and reports no side effects. He reports that although he does take his medication, he eats whatever he wants.  He says that his heart will give out before the sugar kills him.  -Checking BG at home: yes -Fasting home BG: 170s -Diet: reduce sugar and processed foods in your diet  -Exercise: recommend 150 min of physical activity weekly   -Eye exam: utd, due next month -Foot exam: utd -Microalbumin: due -Statin: yes -PNA vaccine: yes -Denies symptoms of hypoglycemia, polyuria, polydipsia, numbness extremities, foot ulcers/trauma.    Relevant past medical, surgical, family and social history reviewed and updated as indicated. Interim medical history since our  last visit reviewed. Allergies and medications reviewed and updated.  Review of Systems  Constitutional: Negative for fever or weight change.  Respiratory: Negative for cough and shortness of breath.   Cardiovascular: Negative for chest pain or palpitations.  Gastrointestinal: Negative for abdominal pain, no bowel changes.  Musculoskeletal: Negative for gait problem or joint swelling.  Skin: Negative for rash.  Neurological: Negative for dizziness or headache.  No other specific complaints in a complete review of systems (except as listed in HPI above).      Objective:    BP 118/64   Pulse 72   Temp 98.1 F (36.7 C) (Oral)   Resp 16   Ht 5\' 8"  (1.727 m)   Wt 170  lb 8 oz (77.3 kg)   SpO2 99%   BMI 25.92 kg/m   Wt Readings from Last 3 Encounters:  08/04/23 170 lb 8 oz (77.3 kg)  04/03/23 170 lb 11.2 oz (77.4 kg)  12/02/22 173 lb (78.5 kg)    Physical Exam  Constitutional: Patient appears well-developed and well-nourished.  No distress.  HEENT: head atraumatic, normocephalic, pupils equal and reactive to light,  neck supple Cardiovascular: Normal rate, regular rhythm and normal heart sounds.  No murmur heard. No BLE edema. Pulmonary/Chest: Effort normal and breath sounds normal. No respiratory distress. Abdominal: Soft.  There is no tenderness. Psychiatric: Patient has a normal mood and affect. behavior is normal. Judgment and thought content normal.    Results for orders placed or performed in visit on 12/02/22  Hemoglobin A1c  Result Value Ref Range   Hgb A1c MFr Bld 8.1 (H) <5.7 % of total Hgb   Mean Plasma Glucose 186 mg/dL   eAG (mmol/L) 91.4 mmol/L  CBC with Differential/Platelet  Result Value Ref Range   WBC 7.1 3.8 - 10.8 Thousand/uL   RBC 5.04 4.20 - 5.80 Million/uL   Hemoglobin 15.2 13.2 - 17.1 g/dL   HCT 78.2 95.6 - 21.3 %   MCV 88.5 80.0 - 100.0 fL   MCH 30.2 27.0 - 33.0 pg   MCHC 34.1 32.0 - 36.0 g/dL   RDW 08.6 57.8 - 46.9 %   Platelets 220 140 - 400 Thousand/uL   MPV 9.3 7.5 - 12.5 fL   Neutro Abs 3,742 1,500 - 7,800 cells/uL   Lymphs Abs 2,251 850 - 3,900 cells/uL   Absolute Monocytes 561 200 - 950 cells/uL   Eosinophils Absolute 504 (H) 15 - 500 cells/uL   Basophils Absolute 43 0 - 200 cells/uL   Neutrophils Relative % 52.7 %   Total Lymphocyte 31.7 %   Monocytes Relative 7.9 %   Eosinophils Relative 7.1 %   Basophils Relative 0.6 %  COMPLETE METABOLIC PANEL WITH GFR  Result Value Ref Range   Glucose, Bld 159 (H) 65 - 99 mg/dL   BUN 21 7 - 25 mg/dL   Creat 6.29 5.28 - 4.13 mg/dL   eGFR 74 > OR = 60 KG/MWN/0.27O5   BUN/Creatinine Ratio SEE NOTE: 6 - 22 (calc)   Sodium 137 135 - 146 mmol/L   Potassium 4.9  3.5 - 5.3 mmol/L   Chloride 101 98 - 110 mmol/L   CO2 28 20 - 32 mmol/L   Calcium 10.0 8.6 - 10.3 mg/dL   Total Protein 6.7 6.1 - 8.1 g/dL   Albumin 4.3 3.6 - 5.1 g/dL   Globulin 2.4 1.9 - 3.7 g/dL (calc)   AG Ratio 1.8 1.0 - 2.5 (calc)   Total Bilirubin 0.7 0.2 - 1.2  mg/dL   Alkaline phosphatase (APISO) 42 35 - 144 U/L   AST 17 10 - 35 U/L   ALT 24 9 - 46 U/L  Lipid panel  Result Value Ref Range   Cholesterol 126 <200 mg/dL   HDL 41 > OR = 40 mg/dL   Triglycerides 098 <119 mg/dL   LDL Cholesterol (Calc) 62 mg/dL (calc)   Total CHOL/HDL Ratio 3.1 <5.0 (calc)   Non-HDL Cholesterol (Calc) 85 <147 mg/dL (calc)      Assessment & Plan:   Problem List Items Addressed This Visit       Cardiovascular and Mediastinum   Coronary artery disease involving native coronary artery of native heart without angina pectoris (Chronic)    Managed by cardiology  He currently takes atorvastatin 40 mg daily, carvedilol 12.5 mg twice daily and Entresto 24-26 mg he is also on jardiance 25 mg daily,      Relevant Orders   CBC with Differential/Platelet   COMPLETE METABOLIC PANEL WITH GFR   Lipid panel   Atherosclerosis of coronary artery    Managed by cardiology  He currently takes atorvastatin 40 mg daily, carvedilol 12.5 mg twice daily and Entresto 24-26 mg he is also on jardiance 25 mg daily,      Relevant Orders   CBC with Differential/Platelet   COMPLETE METABOLIC PANEL WITH GFR   Lipid panel   Essential hypertension - Primary    Managed by cardiology  He currently takes atorvastatin 40 mg daily, carvedilol 12.5 mg twice daily and Entresto 24-26 mg he is also on jardiance 25 mg daily,      Relevant Orders   CBC with Differential/Platelet   COMPLETE METABOLIC PANEL WITH GFR   Heart failure (HCC)    Managed by cardiology  He currently takes atorvastatin 40 mg daily, carvedilol 12.5 mg twice daily and Entresto 24-26 mg he is also on jardiance 25 mg daily,      Relevant Orders   CBC  with Differential/Platelet   COMPLETE METABOLIC PANEL WITH GFR   Lipid panel   Cardiomyopathy (HCC)    Managed by cardiology  He currently takes atorvastatin 40 mg daily, carvedilol 12.5 mg twice daily and Entresto 24-26 mg he is also on jardiance 25 mg daily,      Relevant Orders   CBC with Differential/Platelet   COMPLETE METABOLIC PANEL WITH GFR   Lipid panel     Endocrine   Diabetes mellitus type II, controlled (HCC)    Diabetes is uncontrolled, patient takes his medications as prescribed but eats whatever he wants. He reports his heart will give out before the sugar will kill him. He is aware of the risks associated with uncontrolled diabetes.         Other   Hyperlipidemia LDL goal <70 (Chronic)    Continue atorvastatin 40 mg daily      Relevant Orders   COMPLETE METABOLIC PANEL WITH GFR   Lipid panel   History of MI (myocardial infarction)    Managed by cardiology  He currently takes atorvastatin 40 mg daily, carvedilol 12.5 mg twice daily and Entresto 24-26 mg he is also on jardiance 25 mg daily,      Relevant Orders   CBC with Differential/Platelet   COMPLETE METABOLIC PANEL WITH GFR   Lipid panel       Follow up plan: Return in about 4 months (around 12/05/2023) for follow up.

## 2023-08-05 LAB — COMPLETE METABOLIC PANEL WITH GFR
AG Ratio: 1.7 (calc) (ref 1.0–2.5)
ALT: 26 U/L (ref 9–46)
AST: 18 U/L (ref 10–35)
Albumin: 4.3 g/dL (ref 3.6–5.1)
Alkaline phosphatase (APISO): 47 U/L (ref 35–144)
BUN: 21 mg/dL (ref 7–25)
CO2: 27 mmol/L (ref 20–32)
Calcium: 9.8 mg/dL (ref 8.6–10.3)
Chloride: 101 mmol/L (ref 98–110)
Creat: 1.02 mg/dL (ref 0.70–1.28)
Globulin: 2.5 g/dL (ref 1.9–3.7)
Glucose, Bld: 141 mg/dL — ABNORMAL HIGH (ref 65–99)
Potassium: 4.8 mmol/L (ref 3.5–5.3)
Sodium: 137 mmol/L (ref 135–146)
Total Bilirubin: 0.8 mg/dL (ref 0.2–1.2)
Total Protein: 6.8 g/dL (ref 6.1–8.1)
eGFR: 78 mL/min/{1.73_m2} (ref >=60)

## 2023-08-05 LAB — MICROALBUMIN / CREATININE URINE RATIO
Creatinine, Urine: 68 mg/dL (ref 20–320)
Microalb Creat Ratio: 6 mg/g{creat} (ref <30)
Microalb, Ur: 0.4 mg/dL

## 2023-08-05 LAB — CBC WITH DIFFERENTIAL/PLATELET
Absolute Monocytes: 548 {cells}/uL (ref 200–950)
Basophils Absolute: 66 {cells}/uL (ref 0–200)
Basophils Relative: 0.8 %
Eosinophils Absolute: 1170 {cells}/uL — ABNORMAL HIGH (ref 15–500)
Eosinophils Relative: 14.1 %
HCT: 45 % (ref 38.5–50.0)
Hemoglobin: 15 g/dL (ref 13.2–17.1)
Lymphs Abs: 2332 {cells}/uL (ref 850–3900)
MCH: 30.4 pg (ref 27.0–33.0)
MCHC: 33.3 g/dL (ref 32.0–36.0)
MCV: 91.1 fL (ref 80.0–100.0)
MPV: 9.2 fL (ref 7.5–12.5)
Monocytes Relative: 6.6 %
Neutro Abs: 4183 {cells}/uL (ref 1500–7800)
Neutrophils Relative %: 50.4 %
Platelets: 244 10*3/uL (ref 140–400)
RBC: 4.94 10*6/uL (ref 4.20–5.80)
RDW: 12.1 % (ref 11.0–15.0)
Total Lymphocyte: 28.1 %
WBC: 8.3 10*3/uL (ref 3.8–10.8)

## 2023-08-05 LAB — HEMOGLOBIN A1C
Hgb A1c MFr Bld: 7.7 %{Hb} — ABNORMAL HIGH (ref <5.7)
Mean Plasma Glucose: 174 mg/dL
eAG (mmol/L): 9.7 mmol/L

## 2023-08-05 LAB — LIPID PANEL
Cholesterol: 130 mg/dL (ref <200)
HDL: 46 mg/dL (ref >=40)
LDL Cholesterol (Calc): 63 mg/dL
Non-HDL Cholesterol (Calc): 84 mg/dL (ref <130)
Total CHOL/HDL Ratio: 2.8 (calc) (ref <5.0)
Triglycerides: 130 mg/dL (ref <150)

## 2023-08-08 ENCOUNTER — Other Ambulatory Visit: Payer: Self-pay

## 2023-08-21 ENCOUNTER — Other Ambulatory Visit (HOSPITAL_BASED_OUTPATIENT_CLINIC_OR_DEPARTMENT_OTHER): Payer: Self-pay

## 2023-08-29 ENCOUNTER — Other Ambulatory Visit: Payer: Self-pay

## 2023-08-30 ENCOUNTER — Other Ambulatory Visit: Payer: Self-pay

## 2023-09-08 ENCOUNTER — Other Ambulatory Visit: Payer: Self-pay

## 2023-09-11 ENCOUNTER — Ambulatory Visit (INDEPENDENT_AMBULATORY_CARE_PROVIDER_SITE_OTHER): Payer: Medicare Other | Admitting: Nurse Practitioner

## 2023-09-11 ENCOUNTER — Other Ambulatory Visit: Payer: Self-pay

## 2023-09-11 ENCOUNTER — Encounter: Payer: Self-pay | Admitting: Nurse Practitioner

## 2023-09-11 VITALS — BP 124/76 | HR 85 | Temp 97.9°F | Resp 16 | Ht 68.0 in | Wt 172.4 lb

## 2023-09-11 DIAGNOSIS — Z0000 Encounter for general adult medical examination without abnormal findings: Secondary | ICD-10-CM | POA: Diagnosis not present

## 2023-09-11 DIAGNOSIS — H2513 Age-related nuclear cataract, bilateral: Secondary | ICD-10-CM | POA: Diagnosis not present

## 2023-09-11 DIAGNOSIS — E119 Type 2 diabetes mellitus without complications: Secondary | ICD-10-CM | POA: Diagnosis not present

## 2023-09-11 LAB — HM DIABETES EYE EXAM

## 2023-09-11 NOTE — Progress Notes (Signed)
Subjective:   Maurice Fischer is a 72 y.o. male who presents for Medicare Annual/Subsequent preventive examination.  Review of Systems:  Constitutional: Negative for fever or weight change.  Respiratory: Negative for cough and shortness of breath.   Cardiovascular: Negative for chest pain or palpitations.  Gastrointestinal: Negative for abdominal pain, no bowel changes.  Musculoskeletal: Negative for gait problem or joint swelling.  Skin: Negative for rash.  Neurological: Negative for dizziness or headache.  No other specific complaints in a complete review of systems (except as listed in HPI above).        Objective:    Vitals: BP 124/76   Pulse 85   Temp 97.9 F (36.6 C) (Oral)   Resp 16   Ht 5\' 8"  (1.727 m)   Wt 172 lb 6.4 oz (78.2 kg)   SpO2 98%   BMI 26.21 kg/m   Body mass index is 26.21 kg/m.     06/17/2021    7:25 AM 08/27/2020    3:08 PM 07/25/2019    3:15 PM 05/10/2018   12:58 PM 06/27/2017   10:22 AM 04/13/2017    9:17 AM 03/28/2017   10:49 AM  Advanced Directives  Does Patient Have a Medical Advance Directive? No No No No No No No  Would patient like information on creating a medical advance directive? No - Patient declined Yes (MAU/Ambulatory/Procedural Areas - Information given) Yes (MAU/Ambulatory/Procedural Areas - Information given) Yes (MAU/Ambulatory/Procedural Areas - Information given)      Hearing Screening   500Hz  1000Hz  2000Hz  4000Hz   Right ear Pass Pass Pass Pass  Left ear Pass Pass Pass Pass   Vision Screening   Right eye Left eye Both eyes  Without correction     With correction 20/40 20/25 20/25     Tobacco Social History   Tobacco Use  Smoking Status Former   Current packs/day: 0.00   Average packs/day: 2.0 packs/day for 20.0 years (40.0 ttl pk-yrs)   Types: Cigarettes   Start date: 10/08/1972   Quit date: 10/08/1992   Years since quitting: 30.9  Smokeless Tobacco Never  Tobacco Comments   smoking cessation materials not  required     Counseling given: Not Answered Tobacco comments: smoking cessation materials not required   Clinical Intake:  Pre-visit preparation completed: Yes  Pain : No/denies pain     Nutritional Status: BMI 25 -29 Overweight Nutritional Risks: None Diabetes: Yes CBG done?: No Did pt. bring in CBG monitor from home?: No  How often do you need to have someone help you when you read instructions, pamphlets, or other written materials from your doctor or pharmacy?: 1 - Never  Interpreter Needed?: No     Past Medical History:  Diagnosis Date   Diabetes mellitus without complication (HCC)    Diverticulosis 2004   Heart disease    Hyperlipidemia    Hypertension    Inguinal hernia 2015   right   Umbilical hernia 2015   Past Surgical History:  Procedure Laterality Date   blister cut open on right thumb   03/2017   COLONOSCOPY  2004   HERNIA REPAIR  1959?   HERNIA REPAIR  01/13/2014   umbilical hernia/ Dr Evette Cristal   HERNIA REPAIR  01/13/2014   right inguinal hernia repair/ Dr Evette Cristal   LEFT HEART CATH AND CORONARY ANGIOGRAPHY N/A 06/17/2021   Procedure: LEFT HEART CATH AND CORONARY ANGIOGRAPHY with Possible Intervention;  Surgeon: Alwyn Pea, MD;  Location: [facility] INVASIVE CV LAB;  Service: Cardiovascular;  Laterality: N/A;   TONSILLECTOMY  1957?   Family History  Problem Relation Age of Onset   Cancer Father        prostate   Coronary artery disease Mother    Healthy Sister    Depression Brother    Social History   Socioeconomic History   Marital status: Married    Spouse name: Declined   Number of children: 0   Years of education: some college   Highest education level: 12th grade  Occupational History   Occupation: Retired  Tobacco Use   Smoking status: Former    Current packs/day: 0.00    Average packs/day: 2.0 packs/day for 20.0 years (40.0 ttl pk-yrs)    Types: Cigarettes    Start date: 10/08/1972    Quit date: 10/08/1992    Years since  quitting: 30.9   Smokeless tobacco: Never   Tobacco comments:    smoking cessation materials not required  Vaping Use   Vaping status: Never Used  Substance and Sexual Activity   Alcohol use: Yes    Alcohol/week: 7.0 standard drinks of alcohol    Types: 7 Standard drinks or equivalent per week   Drug use: No   Sexual activity: Not Currently  Other Topics Concern   Not on file  Social History Narrative   Not on file   Social Determinants of Health   Financial Resource Strain: Low Risk  (09/11/2023)   Overall Financial Resource Strain (CARDIA)    Difficulty of Paying Living Expenses: Not hard at all  Food Insecurity: No Food Insecurity (09/11/2023)   Hunger Vital Sign    Worried About Running Out of Food in the Last Year: Never true    Ran Out of Food in the Last Year: Never true  Transportation Needs: No Transportation Needs (09/11/2023)   PRAPARE - Administrator, Civil Service (Medical): No    Lack of Transportation (Non-Medical): No  Physical Activity: Sufficiently Active (09/11/2023)   Exercise Vital Sign    Days of Exercise per Week: 3 days    Minutes of Exercise per Session: 60 min  Stress: No Stress Concern Present (09/06/2022)   [employer] of Occupational Health - Occupational Stress Questionnaire    Feeling of Stress : Only a little  Social Connections: Moderately Integrated (09/11/2023)   Social Connection and Isolation Panel [NHANES]    Frequency of Communication with Friends and Family: More than three times a week    Frequency of Social Gatherings with Friends and Family: More than three times a week    Attends Religious Services: 1 to 4 times per year    Active Member of [employer] or Organizations: No    Attends Banker Meetings: Never    Marital Status: Married    Outpatient Encounter Medications as of 09/11/2023  Medication Sig   aspirin 81 MG tablet Take 81 mg by mouth daily.   atorvastatin (LIPITOR) 40 MG tablet TAKE 1  TABLET (40 MG TOTAL) BY MOUTH DAILY.   carvedilol (COREG) 12.5 MG tablet Take 1 tablet (12.5 mg total) by mouth 2 (two) times daily with meals   Cholecalciferol (VITAMIN D3) 2000 units TABS Take 2,000 Units by mouth daily.   empagliflozin (JARDIANCE) 25 MG TABS tablet Take 1 tablet (25 mg total) by mouth daily before breakfast.   metFORMIN (GLUCOPHAGE) 1000 MG tablet Take 1 tablet (1,000 mg total) by mouth 2 (two) times daily with a meal.   Multiple Vitamin (MULTIVITAMIN) tablet Take 1 tablet  by mouth daily.   nitroGLYCERIN (NITROSTAT) 0.4 MG SL tablet Place 1 tablet (0.4 mg total) under the tongue every 5 (five) minutes x 3 doses as needed for chest pain.   sacubitril-valsartan (ENTRESTO) 24-26 MG Take 1 tablet by mouth every 12 (twelve) hours.   sitaGLIPtin (JANUVIA) 100 MG tablet Take 1 tablet (100 mg total) by mouth daily.   spironolactone (ALDACTONE) 25 MG tablet Take 0.5 tablets (12.5 mg total) by mouth daily.   tetrahydrozoline-zinc (VISINE-AC) 0.05-0.25 % ophthalmic solution Place 2 drops into both eyes daily as needed (eye irritation).   glucose blood test strip Use as directed to monitor blood sugars once a day (Patient not taking: Reported on 04/03/2023)   No facility-administered encounter medications on file as of 09/11/2023.    Activities of Daily Living    09/11/2023    1:08 PM 08/04/2023    8:12 AM  In your present state of health, do you have any difficulty performing the following activities:  Hearing? 0 0  Vision? 0 0  Difficulty concentrating or making decisions? 0 0  Walking or climbing stairs? 0 0  Dressing or bathing? 0 0  Doing errands, shopping? 0 0    Patient Care Team: Berniece Salines, FNP as PCP - General (Nurse Practitioner) Alwyn Pea, MD as Consulting Physician (Cardiology)   Assessment:   This is a routine wellness examination for Gilliam.  Exercise Activities and Dietary recommendations     Goals Addressed   None     Fall Risk:     09/11/2023    1:08 PM 08/04/2023    8:12 AM 04/03/2023    8:16 AM 12/02/2022    8:00 AM 09/06/2022    3:09 PM  Fall Risk   Falls in the past year? 0 0 0 0 0  Number falls in past yr: 0 0 0 0 0  Injury with Fall? 0 0 0 0 0  Risk for fall due to : No Fall Risks No Fall Risks     Follow up Falls prevention discussed Falls prevention discussed       FALL RISK PREVENTION PERTAINING TO THE HOME:  Any stairs in or around the home? No  If so, are there any without handrails? No   Home free of loose throw rugs in walkways, pet beds, electrical cords, etc? No  Adequate lighting in your home to reduce risk of falls? No   ASSISTIVE DEVICES UTILIZED TO PREVENT FALLS:  Life alert? No  Use of a cane, walker or w/c? No  Grab bars in the bathroom? No  Shower chair or bench in shower? Yes Elevated toilet seat or a handicapped toilet? Yes   TIMED UP AND GO:  Was the test performed? Yes .  Length of time to ambulate 10 feet: 2 sec.   GAIT:  Appearance of gait: Gait steady and fast without the use of an assistive device. OR Gait slow and steady without the use of an assistive device.  Education: Fall risk prevention has been discussed.  Intervention(s) required? No  DME/home health order needed?  No   Depression Screen    09/11/2023    1:08 PM 08/04/2023    8:12 AM 04/03/2023    8:16 AM 12/02/2022    8:01 AM  PHQ 2/9 Scores  PHQ - 2 Score 0 0 0 0    Cognitive Function        09/11/2023    1:32 PM 07/25/2019    3:19 PM 05/10/2018  12:46 PM  6CIT Screen  What Year? 0 points 0 points 0 points  What month? 0 points 0 points 0 points  What time? 0 points 0 points 0 points  Count back from 20 0 points 0 points 0 points  Months in reverse 0 points 0 points 0 points  Repeat phrase 0 points 0 points 0 points  Total Score 0 points 0 points 0 points    Immunization History  Administered Date(s) Administered   Fluad Quad(high Dose 65+) 07/02/2019, 07/15/2020, 07/30/2021, 07/15/2022    Fluad Trivalent(High Dose 65+) 07/05/2023   Influenza Split 08/16/2007   Influenza, High Dose Seasonal PF 07/22/2015, 07/06/2016, 06/27/2017, 07/09/2018   Influenza,inj,Quad PF,6+ Mos 06/26/2014   Influenza-Unspecified 07/02/2019   Moderna Covid-19 Vaccine Bivalent Booster 29yrs & up 08/17/2021   Moderna SARS-COV2 Booster Vaccination 09/11/2020, 02/05/2021   PFIZER(Purple Top)SARS-COV-2 Vaccination 12/15/2019, 01/07/2020   Pfizer(Comirnaty)Fall Seasonal Vaccine 12 years and older 08/22/2022, 07/05/2023   Pneumococcal Conjugate-13 10/08/2014   Pneumococcal Polysaccharide-23 10/08/2014, 05/10/2018   Respiratory Syncytial Virus Vaccine,Recomb Aduvanted(Arexvy) 07/15/2022   Td 03/04/2013   Zoster Recombinant(Shingrix) 08/17/2018, 12/18/2018   Zoster, Live 06/28/2011    Qualifies for Shingles Vaccine? Yes  Zostavax completed yes. Due for Shingrix. Education has been provided regarding the importance of this vaccine. Pt has been advised to call insurance company to determine out of pocket expense. Advised may also receive vaccine at local pharmacy or Health Dept. Verbalized acceptance and understanding.  Tdap: Although this vaccine is not a covered service during a Wellness Exam, does the patient still wish to receive this vaccine today?  No .  Education has been provided regarding the importance of this vaccine. Advised may receive this vaccine at local pharmacy or Health Dept. Aware to provide a copy of the vaccination record if obtained from local pharmacy or Health Dept. Verbalized acceptance and understanding.  Flu Vaccine: Due for Flu vaccine. Does the patient want to receive this vaccine today?  Yes . Education has been provided regarding the importance of this vaccine but still declined. Advised may receive this vaccine at local pharmacy or Health Dept. Aware to provide a copy of the vaccination record if obtained from local pharmacy or Health Dept. Verbalized acceptance and  understanding.  Pneumococcal Vaccine: Due for Pneumococcal vaccine. Does the patient want to receive this vaccine today?  Yes . Education has been provided regarding the importance of this vaccine but still declined. Advised may receive this vaccine at local pharmacy or Health Dept. Aware to provide a copy of the vaccination record if obtained from local pharmacy or Health Dept. Verbalized acceptance and understanding.   Covid-19 Vaccine: up to date Information provided/ Completed vaccines  Screening Tests Health Maintenance  Topic Date Due   DTaP/Tdap/Td (2 - Tdap) 03/05/2023   COVID-19 Vaccine (6 - 2023-24 season) 08/30/2023   Colonoscopy  01/08/2024   OPHTHALMOLOGY EXAM  09/14/2023   HEMOGLOBIN A1C  02/02/2024   FOOT EXAM  04/02/2024   Diabetic kidney evaluation - eGFR measurement  08/03/2024   Diabetic kidney evaluation - Urine ACR  08/03/2024   Medicare Annual Wellness (AWV)  09/10/2024   Pneumonia Vaccine 84+ Years old  Completed   INFLUENZA VACCINE  Completed   Hepatitis C Screening  Completed   Zoster Vaccines- Shingrix  Completed   HPV VACCINES  Aged Out   Cancer Screenings:  Colorectal Screening: Completed 01/07/2014. Repeat every 10 years; No longer required. Referral to GI placed . Pt aware the office will call re: appt.  Lung Cancer Screening: (Low Dose CT Chest recommended if Age 23-80 years, 30 pack-year currently smoking OR have quit w/in 15years.) does not qualify.   Lung Cancer Screening Referral: An Epic message has been sent to Glenna Fellows, RN (Oncology Nurse Navigator) regarding the possible need for this exam. Ines Bloomer will review the patient's chart to determine if the patient truly qualifies for the exam. If the patient qualifies, Ines Bloomer will order the Low Dose CT of the chest to facilitate the scheduling of this exam.  Additional Screening:  Hepatitis C Screening: does not qualify; Completed 02/23/2017  Vision Screening: Recommended annual ophthalmology  exams for early detection of glaucoma and other disorders of the eye. Appointment 09/11/2023 Is the patient up to date with their annual eye exam?  Yes  Who is the provider or what is the name of the office in which the pt attends annual eye exams?  [facility] [address] If pt is not established with a provider, would they like to be referred to a provider to establish care? No . Ophthalmology referral has been placed. Pt aware the office will call re: appt.  Dental Screening: Recommended annual dental exams for proper oral hygiene  Community Resource Referral:  CRR required this visit?  No        Plan:  I have personally reviewed and addressed the Medicare Annual Wellness questionnaire and have noted the following in the patient's chart:  A. Medical and social history B. Use of alcohol, tobacco or illicit drugs  C. Current medications and supplements D. Functional ability and status E.  Nutritional status F.  Physical activity G. Advance directives H. List of other physicians I.  Hospitalizations, surgeries, and ER visits in previous 12 months J.  Vitals K. Screenings such as hearing and vision if needed, cognitive and depression L. Referrals and appointments   In addition, I have reviewed and discussed with patient certain preventive protocols, quality metrics, and best practice recommendations. A written personalized care plan for preventive services as well as general preventive health recommendations were provided to patient.   Signed,   Berniece Salines, FNP  09/11/2023 Nurse Health Advisor   Nurse Notes:  doing well

## 2023-09-14 ENCOUNTER — Other Ambulatory Visit: Payer: Self-pay | Admitting: Nurse Practitioner

## 2023-09-14 DIAGNOSIS — E11618 Type 2 diabetes mellitus with other diabetic arthropathy: Secondary | ICD-10-CM

## 2023-09-15 ENCOUNTER — Other Ambulatory Visit: Payer: Self-pay

## 2023-09-15 MED ORDER — SITAGLIPTIN PHOSPHATE 100 MG PO TABS
100.0000 mg | ORAL_TABLET | Freq: Every day | ORAL | 0 refills | Status: DC
  Filled 2023-09-15: qty 90, 90d supply, fill #0

## 2023-09-15 NOTE — Telephone Encounter (Signed)
Requested Prescriptions  Pending Prescriptions Disp Refills   sitaGLIPtin (JANUVIA) 100 MG tablet 90 tablet 0    Sig: Take 1 tablet (100 mg total) by mouth daily.     Endocrinology:  Diabetes - DPP-4 Inhibitors Passed - 09/14/2023  5:50 AM      Passed - HBA1C is between 0 and 7.9 and within 180 days    Hemoglobin A1C  Date Value Ref Range Status  05/14/2019 8.4  Final   HbA1c, POC (prediabetic range)  Date Value Ref Range Status  09/09/2019 6.3 5.7 - 6.4 % Final   Hgb A1c MFr Bld  Date Value Ref Range Status  08/04/2023 7.7 (H) <5.7 % of total Hgb Final    Comment:    For someone without known diabetes, a hemoglobin A1c value of 6.5% or greater indicates that they may have  diabetes and this should be confirmed with a follow-up  test. . For someone with known diabetes, a value <7% indicates  that their diabetes is well controlled and a value  greater than or equal to 7% indicates suboptimal  control. A1c targets should be individualized based on  duration of diabetes, age, comorbid conditions, and  other considerations. . Currently, no consensus exists regarding use of hemoglobin A1c for diagnosis of diabetes for children. .          Passed - Cr in normal range and within 360 days    Creat  Date Value Ref Range Status  08/04/2023 1.02 0.70 - 1.28 mg/dL Final   Creatinine, Urine  Date Value Ref Range Status  08/04/2023 68 20 - 320 mg/dL Final         Passed - Valid encounter within last 6 months    Recent Outpatient Visits           4 days ago Encounter for [employer] annual wellness exam   [facility] Berniece Salines, FNP   1 month ago Essential hypertension   [facility] Berniece Salines, FNP   5 months ago Essential hypertension   [facility] Berniece Salines, FNP   9 months ago Essential hypertension   [facility] Berniece Salines, FNP   1 year ago  Encounter for [employer] annual wellness exam   [facility] Margarita Mail, DO       Future Appointments             In 2 months Zane Herald, Rudolpho Sevin, FNP [facility], [facility]

## 2023-09-18 ENCOUNTER — Other Ambulatory Visit: Payer: Self-pay

## 2023-10-17 ENCOUNTER — Other Ambulatory Visit: Payer: Self-pay | Admitting: Nurse Practitioner

## 2023-10-17 ENCOUNTER — Other Ambulatory Visit: Payer: Self-pay

## 2023-10-17 DIAGNOSIS — I2510 Atherosclerotic heart disease of native coronary artery without angina pectoris: Secondary | ICD-10-CM

## 2023-10-17 DIAGNOSIS — E785 Hyperlipidemia, unspecified: Secondary | ICD-10-CM

## 2023-10-17 MED FILL — Atorvastatin Calcium Tab 40 MG (Base Equivalent): ORAL | 90 days supply | Qty: 90 | Fill #0 | Status: AC

## 2023-10-17 NOTE — Telephone Encounter (Signed)
Requested Prescriptions  Pending Prescriptions Disp Refills   atorvastatin (LIPITOR) 40 MG tablet 90 tablet 2    Sig: TAKE 1 TABLET (40 MG TOTAL) BY MOUTH DAILY.     Cardiovascular:  Antilipid - Statins Failed - 10/17/2023  3:59 PM      Failed - Lipid Panel in normal range within the last 12 months    Cholesterol, Total  Date Value Ref Range Status  09/16/2015 134 100 - 199 mg/dL Final   Cholesterol  Date Value Ref Range Status  08/04/2023 130 <200 mg/dL Final   LDL Cholesterol (Calc)  Date Value Ref Range Status  08/04/2023 63 mg/dL (calc) Final    Comment:    Reference range: <100 . Desirable range <100 mg/dL for primary prevention;   <70 mg/dL for patients with CHD or diabetic patients  with > or = 2 CHD risk factors. [name] LDL-C is now calculated using the Martin-Hopkins  calculation, which is a validated novel method providing  better accuracy than the Friedewald equation in the  estimation of LDL-C.  Horald Pollen et al. Lenox Ahr. 4540;981(19): 2061-2068  (http://education.[employer].com/faq/FAQ164)    HDL  Date Value Ref Range Status  08/04/2023 46 > OR = 40 mg/dL Final  14/78/2956 47 >21 mg/dL Final   Triglycerides  Date Value Ref Range Status  08/04/2023 130 <150 mg/dL Final         Passed - Patient is not pregnant      Passed - Valid encounter within last 12 months    Recent Outpatient Visits           1 month ago Encounter for [employer] annual wellness exam   [facility] Berniece Salines, FNP   2 months ago Essential hypertension   [facility] Berniece Salines, FNP   6 months ago Essential hypertension   [facility] Berniece Salines, FNP   10 months ago Essential hypertension   [facility] Berniece Salines, FNP   1 year ago Encounter for [employer] annual wellness exam   [facility] Margarita Mail, DO       Future  Appointments             In 1 month Zane Herald, Rudolpho Sevin, FNP [facility], [facility]

## 2023-10-19 ENCOUNTER — Other Ambulatory Visit: Payer: Self-pay

## 2023-10-26 ENCOUNTER — Other Ambulatory Visit: Payer: Self-pay

## 2023-11-09 ENCOUNTER — Other Ambulatory Visit: Payer: Self-pay

## 2023-12-05 ENCOUNTER — Ambulatory Visit: Payer: Medicare Other | Admitting: Nurse Practitioner

## 2023-12-05 ENCOUNTER — Encounter: Payer: Self-pay | Admitting: Nurse Practitioner

## 2023-12-05 ENCOUNTER — Other Ambulatory Visit: Payer: Self-pay

## 2023-12-05 VITALS — BP 116/60 | HR 83 | Temp 97.8°F | Resp 18 | Ht 68.0 in | Wt 174.1 lb

## 2023-12-05 DIAGNOSIS — I10 Essential (primary) hypertension: Secondary | ICD-10-CM

## 2023-12-05 DIAGNOSIS — I509 Heart failure, unspecified: Secondary | ICD-10-CM | POA: Diagnosis not present

## 2023-12-05 DIAGNOSIS — Z7984 Long term (current) use of oral hypoglycemic drugs: Secondary | ICD-10-CM

## 2023-12-05 DIAGNOSIS — E11618 Type 2 diabetes mellitus with other diabetic arthropathy: Secondary | ICD-10-CM | POA: Diagnosis not present

## 2023-12-05 DIAGNOSIS — I429 Cardiomyopathy, unspecified: Secondary | ICD-10-CM | POA: Diagnosis not present

## 2023-12-05 DIAGNOSIS — E785 Hyperlipidemia, unspecified: Secondary | ICD-10-CM

## 2023-12-05 DIAGNOSIS — I252 Old myocardial infarction: Secondary | ICD-10-CM

## 2023-12-05 DIAGNOSIS — E1165 Type 2 diabetes mellitus with hyperglycemia: Secondary | ICD-10-CM

## 2023-12-05 DIAGNOSIS — I2510 Atherosclerotic heart disease of native coronary artery without angina pectoris: Secondary | ICD-10-CM | POA: Diagnosis not present

## 2023-12-05 LAB — POCT GLYCOSYLATED HEMOGLOBIN (HGB A1C): Hemoglobin A1C: 7.9 % — AB (ref 4.0–5.6)

## 2023-12-05 MED ORDER — EMPAGLIFLOZIN 25 MG PO TABS
25.0000 mg | ORAL_TABLET | Freq: Every day | ORAL | 1 refills | Status: DC
  Filled 2023-12-05: qty 90, 90d supply, fill #0
  Filled 2024-03-13: qty 90, 90d supply, fill #1

## 2023-12-05 MED ORDER — SITAGLIPTIN PHOSPHATE 100 MG PO TABS
100.0000 mg | ORAL_TABLET | Freq: Every day | ORAL | 3 refills | Status: AC
  Filled 2023-12-05: qty 90, 90d supply, fill #0
  Filled 2024-03-13: qty 90, 90d supply, fill #1
  Filled 2024-06-10: qty 90, 90d supply, fill #2
  Filled 2024-09-10: qty 90, 90d supply, fill #3

## 2023-12-05 MED ORDER — METFORMIN HCL 1000 MG PO TABS
1000.0000 mg | ORAL_TABLET | Freq: Two times a day (BID) | ORAL | 3 refills | Status: AC
  Filled 2023-12-05: qty 180, 90d supply, fill #0
  Filled 2024-03-26: qty 180, 90d supply, fill #1
  Filled 2024-06-26: qty 180, 90d supply, fill #2
  Filled 2024-09-30: qty 180, 90d supply, fill #3

## 2023-12-05 NOTE — Progress Notes (Signed)
BP 116/60   Pulse 83   Temp 97.8 F (36.6 C)   Resp 18   Ht 5\' 8"  (1.727 m)   Wt 174 lb 1.6 oz (79 kg)   SpO2 96%   BMI 26.47 kg/m    Subjective:    Patient ID: Maurice Fischer, male    DOB: 1951/01/20, 73 y.o.   MRN: [id number]  HPI: Maurice Fischer is a 73 y.o. male  Chief Complaint  Patient presents with   Medical Management of Chronic Issues    Discussed the use of AI scribe software for clinical note transcription with the patient, who gave verbal consent to proceed.  History of Present Illness   The patient, with a history of hypertension, heart failure, coronary artery disease, cardiomyopathy, atherosclerosis of coronary artery, type 2 diabetes, hyperlipidemia, and a history of an MI, presents for a routine follow-up. He denies any new or worsening cardiac symptoms. He is scheduled to see his cardiologist soon. He continues to exercise regularly without any cardiac symptoms. He has reported in the past that his heart will give out before the rest of him. It was recommended that he get a defibrillator by cardiology, however patient declined.   Regarding his diabetes, he admits to not checking his blood sugars regularly at home and not adhering to a diabetic diet. He expresses a lack of concern about his diabetes management, stating, "It is what it is." His last A1c was 7.7, and a point of care A1c was obtained during this visit, 7.9. he reports that due to his heart problems he is not concerned about his diabetes. he reports he is going to eat what he wants.       09/11/2023    1:08 PM 08/04/2023    8:12 AM 04/03/2023    8:16 AM  Depression screen PHQ 2/9  Decreased Interest 0 0 0  Down, Depressed, Hopeless 0 0 0  PHQ - 2 Score 0 0 0    Relevant past medical, surgical, family and social history reviewed and updated as indicated. Interim medical history since our last visit reviewed. Allergies and medications reviewed and updated.  Review of  Systems  Constitutional: Negative for fever or weight change.  Respiratory: Negative for cough and shortness of breath.   Cardiovascular: Negative for chest pain or palpitations.  Gastrointestinal: Negative for abdominal pain, no bowel changes.  Musculoskeletal: Negative for gait problem or joint swelling.  Skin: Negative for rash.  Neurological: Negative for dizziness or headache.  No other specific complaints in a complete review of systems (except as listed in HPI above).      Objective:    BP 116/60   Pulse 83   Temp 97.8 F (36.6 C)   Resp 18   Ht 5\' 8"  (1.727 m)   Wt 174 lb 1.6 oz (79 kg)   SpO2 96%   BMI 26.47 kg/m   BP Readings from Last 3 Encounters:  12/05/23 116/60  09/11/23 124/76  08/04/23 118/64     Wt Readings from Last 3 Encounters:  12/05/23 174 lb 1.6 oz (79 kg)  09/11/23 172 lb 6.4 oz (78.2 kg)  08/04/23 170 lb 8 oz (77.3 kg)    Physical Exam Vitals reviewed.  Constitutional:      Appearance: Normal appearance.  HENT:     Head: Normocephalic.  Cardiovascular:     Rate and Rhythm: Normal rate and regular rhythm.  Pulmonary:     Effort: Pulmonary effort is normal.  Breath sounds: Normal breath sounds.  Musculoskeletal:        General: Normal range of motion.  Skin:    General: Skin is warm and dry.  Neurological:     General: No focal deficit present.     Mental Status: He is alert and oriented to person, place, and time. Mental status is at baseline.  Psychiatric:        Mood and Affect: Mood normal.        Behavior: Behavior normal.        Thought Content: Thought content normal.        Judgment: Judgment normal.     Results for orders placed or performed in visit on 12/05/23  POCT HgB A1C   Collection Time: 12/05/23  8:14 AM  Result Value Ref Range   Hemoglobin A1C 7.9 (A) 4.0 - 5.6 %   HbA1c POC (<> result, manual entry)     HbA1c, POC (prediabetic range)     HbA1c, POC (controlled diabetic range)     Last metabolic  panel Lab Results  Component Value Date   GLUCOSE 141 (H) 08/04/2023   NA 137 08/04/2023   K 4.8 08/04/2023   CL 101 08/04/2023   CO2 27 08/04/2023   BUN 21 08/04/2023   CREATININE 1.02 08/04/2023   EGFR 78 08/04/2023   CALCIUM 9.8 08/04/2023   PHOS 3.7 10/11/2018   PROT 6.8 08/04/2023   ALBUMIN 3.9 03/28/2017   LABGLOB 2.5 09/16/2015   AGRATIO 1.8 09/16/2015   BILITOT 0.8 08/04/2023   ALKPHOS 49 03/28/2017   AST 18 08/04/2023   ALT 26 08/04/2023   ANIONGAP 9 03/28/2017   Last lipids Lab Results  Component Value Date   CHOL 130 08/04/2023   HDL 46 08/04/2023   LDLCALC 63 08/04/2023   TRIG 130 08/04/2023   CHOLHDL 2.8 08/04/2023        Assessment & Plan:   Problem List Items Addressed This Visit       Cardiovascular and Mediastinum   Coronary artery disease involving native coronary artery of native heart without angina pectoris (Chronic)   Atherosclerosis of coronary artery   Essential hypertension   Heart failure (HCC) - Primary   Cardiomyopathy (HCC)     Endocrine   Diabetes mellitus type II, controlled (HCC)   Relevant Medications   empagliflozin (JARDIANCE) 25 MG TABS tablet   metFORMIN (GLUCOPHAGE) 1000 MG tablet   sitaGLIPtin (JANUVIA) 100 MG tablet   Diabetes mellitus, type 2 (HCC)   Relevant Medications   empagliflozin (JARDIANCE) 25 MG TABS tablet   metFORMIN (GLUCOPHAGE) 1000 MG tablet   sitaGLIPtin (JANUVIA) 100 MG tablet   Other Relevant Orders   POCT HgB A1C (Completed)     Other   Hyperlipidemia LDL goal <70 (Chronic)   History of MI (myocardial infarction)     Assessment and Plan    Type 2 Diabetes Mellitus A1c increased from 7.7 to 7.9. Patient is not regularly checking blood glucose at home. Currently on metformin 1000mg  BID, Jardiance 25mg  daily, and sitagliptin 100mg  daily. -Continue current medications. -Encourage patient to monitor blood glucose at home. -patient is aware of the complications of having uncontrolled diabetes.     Hypertension/Heart Failure/Coronary Artery Disease/Cardiomyopathy/Atherosclerosis of Coronary Artery Patient reports occasional lightheadedness but no other cardiac symptoms. Blood pressure at goal. Currently on aspirin 81mg  daily, atorvastatin 40mg  daily, carvedilol 12.5mg  BID, and Entresto 24/26mg  BID. -Continue current medications. -Cardiology follow-up scheduled.  Hyperlipidemia Managed with atorvastatin 40mg  daily. -Continue  atorvastatin 40mg  daily.  Follow-up -Schedule next appointment in 6 months.        Follow up plan: Return in about 6 months (around 06/03/2024) for follow up.

## 2023-12-11 DIAGNOSIS — I2510 Atherosclerotic heart disease of native coronary artery without angina pectoris: Secondary | ICD-10-CM | POA: Diagnosis not present

## 2023-12-11 DIAGNOSIS — E7800 Pure hypercholesterolemia, unspecified: Secondary | ICD-10-CM | POA: Diagnosis not present

## 2023-12-11 DIAGNOSIS — I420 Dilated cardiomyopathy: Secondary | ICD-10-CM | POA: Diagnosis not present

## 2023-12-11 DIAGNOSIS — I5022 Chronic systolic (congestive) heart failure: Secondary | ICD-10-CM | POA: Diagnosis not present

## 2023-12-11 DIAGNOSIS — I10 Essential (primary) hypertension: Secondary | ICD-10-CM | POA: Diagnosis not present

## 2023-12-11 DIAGNOSIS — R0609 Other forms of dyspnea: Secondary | ICD-10-CM | POA: Diagnosis not present

## 2023-12-11 DIAGNOSIS — Z87891 Personal history of nicotine dependence: Secondary | ICD-10-CM | POA: Diagnosis not present

## 2023-12-11 DIAGNOSIS — I5020 Unspecified systolic (congestive) heart failure: Secondary | ICD-10-CM | POA: Diagnosis not present

## 2023-12-11 DIAGNOSIS — E119 Type 2 diabetes mellitus without complications: Secondary | ICD-10-CM | POA: Diagnosis not present

## 2024-01-16 MED FILL — Atorvastatin Calcium Tab 40 MG (Base Equivalent): ORAL | 90 days supply | Qty: 90 | Fill #1 | Status: AC

## 2024-02-14 ENCOUNTER — Other Ambulatory Visit: Payer: Self-pay

## 2024-03-13 ENCOUNTER — Other Ambulatory Visit: Payer: Self-pay

## 2024-03-26 ENCOUNTER — Other Ambulatory Visit: Payer: Self-pay

## 2024-04-01 ENCOUNTER — Other Ambulatory Visit: Payer: Self-pay

## 2024-04-17 ENCOUNTER — Other Ambulatory Visit: Payer: Self-pay

## 2024-04-17 MED FILL — Atorvastatin Calcium Tab 40 MG (Base Equivalent): ORAL | 90 days supply | Qty: 90 | Fill #2 | Status: AC

## 2024-06-03 ENCOUNTER — Encounter: Payer: Self-pay | Admitting: Nurse Practitioner

## 2024-06-03 ENCOUNTER — Ambulatory Visit: Payer: Medicare Other | Admitting: Nurse Practitioner

## 2024-06-03 VITALS — BP 112/60 | HR 77 | Resp 18 | Ht 68.0 in | Wt 171.8 lb

## 2024-06-03 DIAGNOSIS — Z7984 Long term (current) use of oral hypoglycemic drugs: Secondary | ICD-10-CM

## 2024-06-03 DIAGNOSIS — I2510 Atherosclerotic heart disease of native coronary artery without angina pectoris: Secondary | ICD-10-CM | POA: Diagnosis not present

## 2024-06-03 DIAGNOSIS — I10 Essential (primary) hypertension: Secondary | ICD-10-CM

## 2024-06-03 DIAGNOSIS — Z125 Encounter for screening for malignant neoplasm of prostate: Secondary | ICD-10-CM

## 2024-06-03 DIAGNOSIS — J302 Other seasonal allergic rhinitis: Secondary | ICD-10-CM

## 2024-06-03 DIAGNOSIS — I429 Cardiomyopathy, unspecified: Secondary | ICD-10-CM

## 2024-06-03 DIAGNOSIS — I509 Heart failure, unspecified: Secondary | ICD-10-CM

## 2024-06-03 DIAGNOSIS — E785 Hyperlipidemia, unspecified: Secondary | ICD-10-CM

## 2024-06-03 DIAGNOSIS — E1165 Type 2 diabetes mellitus with hyperglycemia: Secondary | ICD-10-CM

## 2024-06-03 DIAGNOSIS — I252 Old myocardial infarction: Secondary | ICD-10-CM

## 2024-06-03 NOTE — Progress Notes (Signed)
 BP 112/60   Pulse 77   Resp 18   Ht 5' 8 (1.727 m)   Wt 171 lb 12.8 oz (77.9 kg)   SpO2 98%   BMI 26.12 kg/m    Subjective:    Patient ID: Maurice Fischer, male    DOB: Nov 10, 1950, 73 y.o.   MRN: [id number]  HPI: Maurice Fischer is a 73 y.o. male  Chief Complaint  Patient presents with   Medical Management of Chronic Issues    Discussed the use of AI scribe software for clinical note transcription with the patient, who gave verbal consent to proceed.  History of Present Illness Maurice Fischer is a 73 year old male with coronary artery disease, cardiomyopathy, heart failure, hypertension, atherosclerosis, diabetes, and hyperlipidemia who presents for a six-month follow-up.  Hypotension and associated symptoms - Low blood pressure readings at home, often around 80/50 mmHg - Occasional lightheadedness associated with low blood pressure - Continues all prescribed medications despite hypotension due to uncertainty about which medication to skip - Upcoming cardiology appointment next week  Sleep disturbance - Difficulty returning to sleep after awakening at night to urinate - No difficulty falling asleep initially - Occasionally uses Tylenol  PM to aid sleep, but avoids nightly use - Previous trial of melatonin without benefit - No hangover effect after Tylenol  PM  Glycemic control and dietary habits - Last hemoglobin A1c was 7.9 - Does not regularly monitor blood glucose - Diet is not restricted; eats as desired - Blood glucose is usually elevated - Not overly concerned about diabetes due to cardiac comorbidities  Medication adherence and management - Current regimen includes aspirin  81 mg daily, atorvastatin  40 mg daily, carvedilol  12.5 mg twice a day, Jardiance  25 mg daily, metformin  1000 mg twice a day, nitroglycerin  as needed, Entresto  24-26 mg twice a day, Januvia  100 mg daily, and spironolactone  12.5 mg daily - Previously discussed AICD placement but  declined  Prostate cancer screening - Inquires about PSA levels - Last PSA checked in 2021 with a result of 0.6         06/03/2024    8:00 AM 09/11/2023    1:08 PM 08/04/2023    8:12 AM  Depression screen PHQ 2/9  Decreased Interest 0 0 0  Down, Depressed, Hopeless 0 0 0  PHQ - 2 Score 0 0 0  Altered sleeping 0    Tired, decreased energy 0    Change in appetite 0    Feeling bad or failure about yourself  0    Trouble concentrating 0    Moving slowly or fidgety/restless 0    Suicidal thoughts 0    PHQ-9 Score 0    Difficult doing work/chores Not difficult at all      Relevant past medical, surgical, family and social history reviewed and updated as indicated. Interim medical history since our last visit reviewed. Allergies and medications reviewed and updated.  Review of Systems  Constitutional: Negative for fever or weight change.  Respiratory: Negative for cough and shortness of breath.   Cardiovascular: Negative for chest pain or palpitations.  Gastrointestinal: Negative for abdominal pain, no bowel changes.  Musculoskeletal: Negative for gait problem or joint swelling.  Skin: Negative for rash.  Neurological: Negative for dizziness or headache.  No other specific complaints in a complete review of systems (except as listed in HPI above).      Objective:     BP 112/60   Pulse 77   Resp 18  Ht 5' 8 (1.727 m)   Wt 171 lb 12.8 oz (77.9 kg)   SpO2 98%   BMI 26.12 kg/m    Wt Readings from Last 3 Encounters:  06/03/24 171 lb 12.8 oz (77.9 kg)  12/05/23 174 lb 1.6 oz (79 kg)  09/11/23 172 lb 6.4 oz (78.2 kg)    Physical Exam Physical Exam GENERAL: Alert, cooperative, well developed, no acute distress HEENT: Normocephalic, normal oropharynx, moist mucous membranes CHEST: Clear to auscultation bilaterally, no wheezes, rhonchi, or crackles CARDIOVASCULAR: Normal heart rate and rhythm, S1 and S2 normal without murmurs ABDOMEN: Soft, non-tender, non-distended,  without organomegaly, normal bowel sounds EXTREMITIES: No cyanosis or edema NEUROLOGICAL: Cranial nerves grossly intact, moves all extremities without gross motor or sensory deficit   Results for orders placed or performed in visit on 12/05/23  POCT HgB A1C   Collection Time: 12/05/23  8:14 AM  Result Value Ref Range   Hemoglobin A1C 7.9 (A) 4.0 - 5.6 %   HbA1c POC (<> result, manual entry)     HbA1c, POC (prediabetic range)     HbA1c, POC (controlled diabetic range)            Assessment & Plan:   Problem List Items Addressed This Visit       Cardiovascular and Mediastinum   Coronary artery disease involving native coronary artery of native heart without angina pectoris - Primary (Chronic)   Relevant Orders   Comprehensive metabolic panel with GFR   Lipid panel   Atherosclerosis of coronary artery   Relevant Orders   Comprehensive metabolic panel with GFR   Lipid panel   Essential hypertension   Relevant Orders   CBC with Differential/Platelet   Comprehensive metabolic panel with GFR   Heart failure (HCC)   Relevant Orders   Comprehensive metabolic panel with GFR   Lipid panel   Cardiomyopathy (HCC)   Relevant Orders   Comprehensive metabolic panel with GFR   Lipid panel     Respiratory   Seasonal allergic rhinitis     Endocrine   Diabetes mellitus, type 2 (HCC)   Relevant Orders   CBC with Differential/Platelet   Comprehensive metabolic panel with GFR   Lipid panel   Hemoglobin A1c   Microalbumin / creatinine urine ratio     Musculoskeletal and Integument   Osteoarthritis     Other   Hyperlipidemia LDL goal <70 (Chronic)   Relevant Orders   Comprehensive metabolic panel with GFR   Lipid panel   History of MI (myocardial infarction)   Other Visit Diagnoses       Screening for prostate cancer       Relevant Orders   PSA        Assessment and Plan Assessment & Plan Coronary artery disease, cardiomyopathy, and heart failure/ hx of mi/  atherosclerosis Managed with aspirin , atorvastatin , carvedilol , nitroglycerin , and Entresto . Blood pressure is well-controlled. He is followed by cardiology and has declined AICD placement after discussion with his cardiologist. - Continue aspirin  81 mg daily, atorvastatin  40 mg daily, carvedilol  12.5 mg twice daily, nitroglycerin  as needed, Entresto  24/26 mg twice daily - Follow up with cardiology as scheduled  Hypertension Blood pressure readings at home are sometimes low, but he does not skip medications. Plans to discuss these low readings with his cardiologist during the upcoming appointment. - Continue current antihypertensive regimen - Discuss low blood pressure readings with cardiologist during upcoming appointment  Type 2 diabetes mellitus Last A1c of 7.9. He is not overly  concerned about diabetes management due to heart problems. Currently on Jardiance , metformin , and Januvia . Overdue for foot exam. - Order routine labs including A1c  Hyperlipidemia Managed with atorvastatin  40 mg daily. No recent lipid panel discussed. - Continue atorvastatin  40 mg daily  Insomnia Difficulty returning to sleep after waking at night. Occasionally uses Tylenol  PM without hangover effect. Discussed trying magnesium glycinate as a more natural option to help calm the brain and improve sleep quality. - Recommend trial of magnesium glycinate for insomnia - If ineffective, contact provider for alternative options        Follow up plan: Return in about 6 months (around 12/04/2024) for follow up.

## 2024-06-04 LAB — HEMOGLOBIN A1C
Hgb A1c MFr Bld: 8.2 % — ABNORMAL HIGH (ref <5.7)
Mean Plasma Glucose: 189 mg/dL
eAG (mmol/L): 10.4 mmol/L

## 2024-06-04 LAB — LIPID PANEL
Cholesterol: 131 mg/dL (ref <200)
HDL: 51 mg/dL (ref >=40)
LDL Cholesterol (Calc): 58 mg/dL
Non-HDL Cholesterol (Calc): 80 mg/dL (ref <130)
Total CHOL/HDL Ratio: 2.6 (calc) (ref <5.0)
Triglycerides: 134 mg/dL (ref <150)

## 2024-06-04 LAB — COMPREHENSIVE METABOLIC PANEL WITH GFR
AG Ratio: 2 (calc) (ref 1.0–2.5)
ALT: 31 U/L (ref 9–46)
AST: 20 U/L (ref 10–35)
Albumin: 4.5 g/dL (ref 3.6–5.1)
Alkaline phosphatase (APISO): 44 U/L (ref 35–144)
BUN: 21 mg/dL (ref 7–25)
CO2: 29 mmol/L (ref 20–32)
Calcium: 10 mg/dL (ref 8.6–10.3)
Chloride: 100 mmol/L (ref 98–110)
Creat: 1.01 mg/dL (ref 0.70–1.28)
Globulin: 2.3 g/dL (ref 1.9–3.7)
Glucose, Bld: 180 mg/dL — ABNORMAL HIGH (ref 65–99)
Potassium: 5.1 mmol/L (ref 3.5–5.3)
Sodium: 137 mmol/L (ref 135–146)
Total Bilirubin: 0.7 mg/dL (ref 0.2–1.2)
Total Protein: 6.8 g/dL (ref 6.1–8.1)
eGFR: 78 mL/min/1.73m2 (ref >=60)

## 2024-06-04 LAB — CBC WITH DIFFERENTIAL/PLATELET
Absolute Lymphocytes: 2386 {cells}/uL (ref 850–3900)
Absolute Monocytes: 599 {cells}/uL (ref 200–950)
Basophils Absolute: 57 {cells}/uL (ref 0–200)
Basophils Relative: 0.7 %
Eosinophils Absolute: 377 {cells}/uL (ref 15–500)
Eosinophils Relative: 4.6 %
HCT: 45.5 % (ref 38.5–50.0)
Hemoglobin: 15 g/dL (ref 13.2–17.1)
MCH: 30.3 pg (ref 27.0–33.0)
MCHC: 33 g/dL (ref 32.0–36.0)
MCV: 91.9 fL (ref 80.0–100.0)
MPV: 9 fL (ref 7.5–12.5)
Monocytes Relative: 7.3 %
Neutro Abs: 4781 {cells}/uL (ref 1500–7800)
Neutrophils Relative %: 58.3 %
Platelets: 262 Thousand/uL (ref 140–400)
RBC: 4.95 Million/uL (ref 4.20–5.80)
RDW: 13.4 % (ref 11.0–15.0)
Total Lymphocyte: 29.1 %
WBC: 8.2 Thousand/uL (ref 3.8–10.8)

## 2024-06-04 LAB — PSA: PSA: 0.69 ng/mL (ref <=4.00)

## 2024-06-05 ENCOUNTER — Ambulatory Visit: Payer: Self-pay | Admitting: Nurse Practitioner

## 2024-06-10 ENCOUNTER — Other Ambulatory Visit: Payer: Self-pay | Admitting: Nurse Practitioner

## 2024-06-10 ENCOUNTER — Other Ambulatory Visit: Payer: Self-pay

## 2024-06-10 DIAGNOSIS — E11618 Type 2 diabetes mellitus with other diabetic arthropathy: Secondary | ICD-10-CM

## 2024-06-11 ENCOUNTER — Other Ambulatory Visit: Payer: Self-pay

## 2024-06-12 ENCOUNTER — Other Ambulatory Visit: Payer: Self-pay

## 2024-06-12 MED FILL — Empagliflozin Tab 25 MG: ORAL | 90 days supply | Qty: 90 | Fill #0 | Status: AC

## 2024-06-12 NOTE — Telephone Encounter (Signed)
 Requested Prescriptions  Pending Prescriptions Disp Refills   JARDIANCE  25 MG TABS tablet [Pharmacy Med Name: empagliflozin  (JARDIANCE ) 25 MG Tab tablet] 90 tablet 1    Sig: Take 1 tablet (25 mg total) by mouth daily before breakfast.     Endocrinology:  Diabetes - SGLT2 Inhibitors Failed - 06/12/2024 11:07 AM      Failed - HBA1C is between 0 and 7.9 and within 180 days    Hemoglobin A1C  Date Value Ref Range Status  05/14/2019 8.4  Final   HbA1c, POC (prediabetic range)  Date Value Ref Range Status  09/09/2019 6.3 5.7 - 6.4 % Final   Hgb A1c MFr Bld  Date Value Ref Range Status  06/03/2024 8.2 (H) <5.7 % Final    Comment:    For someone without known diabetes, a hemoglobin A1c value of 6.5% or greater indicates that they may have  diabetes and this should be confirmed with a follow-up  test. . For someone with known diabetes, a value <7% indicates  that their diabetes is well controlled and a value  greater than or equal to 7% indicates suboptimal  control. A1c targets should be individualized based on  duration of diabetes, age, comorbid conditions, and  other considerations. . Currently, no consensus exists regarding use of hemoglobin A1c for diagnosis of diabetes for children. .          Passed - Cr in normal range and within 360 days    Creat  Date Value Ref Range Status  06/03/2024 1.01 0.70 - 1.28 mg/dL Final   Creatinine, Urine  Date Value Ref Range Status  08/04/2023 68 20 - 320 mg/dL Final         Passed - eGFR in normal range and within 360 days    GFR, Est African American  Date Value Ref Range Status  02/19/2021 102 > OR = 60 mL/min/1.57m2 Final   GFR, Est Non African American  Date Value Ref Range Status  02/19/2021 88 > OR = 60 mL/min/1.62m2 Final   eGFR  Date Value Ref Range Status  06/03/2024 78 > OR = 60 mL/min/1.69m2 Final         Passed - Valid encounter within last 6 months    Recent Outpatient Visits           1 week ago Coronary  artery disease involving native coronary artery of native heart without angina pectoris   [facility] Gareth Clarity F, FNP   6 months ago Heart failure, unspecified HF chronicity, unspecified heart failure type [facility])   [facility] Gareth Clarity FALCON, FNP       Future Appointments             In 5 months Gareth, Clarity FALCON, FNP [facility]

## 2024-06-13 ENCOUNTER — Other Ambulatory Visit: Payer: Self-pay

## 2024-06-26 ENCOUNTER — Other Ambulatory Visit: Payer: Self-pay

## 2024-07-08 ENCOUNTER — Other Ambulatory Visit: Payer: Self-pay

## 2024-07-08 MED ORDER — COMIRNATY 30 MCG/0.3ML IM SUSY
0.3000 mL | PREFILLED_SYRINGE | Freq: Once | INTRAMUSCULAR | 0 refills | Status: AC
  Filled 2024-07-08: qty 0.3, 1d supply, fill #0

## 2024-07-08 MED ORDER — FLUZONE HIGH-DOSE 0.5 ML IM SUSY
0.5000 mL | PREFILLED_SYRINGE | Freq: Once | INTRAMUSCULAR | 0 refills | Status: AC
  Filled 2024-07-08: qty 0.5, 1d supply, fill #0

## 2024-07-15 ENCOUNTER — Other Ambulatory Visit (HOSPITAL_BASED_OUTPATIENT_CLINIC_OR_DEPARTMENT_OTHER): Payer: Self-pay

## 2024-07-18 ENCOUNTER — Other Ambulatory Visit: Payer: Self-pay

## 2024-07-18 ENCOUNTER — Other Ambulatory Visit: Payer: Self-pay | Admitting: Nurse Practitioner

## 2024-07-18 DIAGNOSIS — I2510 Atherosclerotic heart disease of native coronary artery without angina pectoris: Secondary | ICD-10-CM

## 2024-07-18 DIAGNOSIS — E785 Hyperlipidemia, unspecified: Secondary | ICD-10-CM

## 2024-07-18 MED ORDER — SPIRONOLACTONE 25 MG PO TABS
12.5000 mg | ORAL_TABLET | Freq: Every day | ORAL | 3 refills | Status: AC
  Filled 2024-07-18: qty 45, 90d supply, fill #0
  Filled 2024-10-14: qty 45, 90d supply, fill #1

## 2024-07-18 NOTE — Progress Notes (Signed)
 Maurice Fischer                                          MRN: [id number]   07/18/2024   The [facility] Quality Team Specialist reviewed this patient medical record for the purposes of chart review for care gap closure. The following were reviewed: chart review for care gap closure-colorectal cancer screening.    [facility]BCI Quality Team

## 2024-07-19 ENCOUNTER — Other Ambulatory Visit: Payer: Self-pay

## 2024-07-19 MED FILL — Atorvastatin Calcium Tab 40 MG (Base Equivalent): ORAL | 90 days supply | Qty: 90 | Fill #0 | Status: AC

## 2024-07-19 NOTE — Telephone Encounter (Signed)
 Requested Prescriptions  Pending Prescriptions Disp Refills   atorvastatin  (LIPITOR) 40 MG tablet 90 tablet 2    Sig: TAKE 1 TABLET (40 MG TOTAL) BY MOUTH DAILY.     Cardiovascular:  Antilipid - Statins Failed - 07/19/2024  1:32 PM      Failed - Lipid Panel in normal range within the last 12 months    Cholesterol, Total  Date Value Ref Range Status  09/16/2015 134 100 - 199 mg/dL Final   Cholesterol  Date Value Ref Range Status  06/03/2024 131 <200 mg/dL Final   LDL Cholesterol (Calc)  Date Value Ref Range Status  06/03/2024 58 mg/dL (calc) Final    Comment:    Reference range: <100 . Desirable range <100 mg/dL for primary prevention;   <70 mg/dL for patients with CHD or diabetic patients  with > or = 2 CHD risk factors. SABRA LDL-C is now calculated using the Martin-Hopkins  calculation, which is a validated novel method providing  better accuracy than the Friedewald equation in the  estimation of LDL-C.  Gladis APPLETHWAITE et al. SANDREA. 7986;689(80): 2061-2068  (http://education.[employer].com/faq/FAQ164)    HDL  Date Value Ref Range Status  06/03/2024 51 > OR = 40 mg/dL Final  88/76/7983 47 >60 mg/dL Final   Triglycerides  Date Value Ref Range Status  06/03/2024 134 <150 mg/dL Final         Passed - Patient is not pregnant      Passed - Valid encounter within last 12 months    Recent Outpatient Visits           1 month ago Coronary artery disease involving native coronary artery of native heart without angina pectoris   [facility] Gareth Mliss FALCON, FNP   7 months ago Heart failure, unspecified HF chronicity, unspecified heart failure type [facility])   [facility] Gareth Mliss FALCON, FNP       Future Appointments             In 4 months Gareth, Mliss FALCON, FNP [facility] [place]

## 2024-07-22 ENCOUNTER — Other Ambulatory Visit: Payer: Self-pay

## 2024-07-29 ENCOUNTER — Other Ambulatory Visit: Payer: Self-pay

## 2024-08-07 NOTE — Progress Notes (Signed)
 Maurice Fischer                                          MRN: [id number]   08/07/2024   The [facility]BCI Quality Team Specialist reviewed this patient medical record for the purposes of chart review for care gap closure. The following were reviewed: chart review for care gap closure-kidney health evaluation for diabetes:eGFR  and uACR.    [facility]BCI Quality Team

## 2024-08-20 ENCOUNTER — Telehealth: Payer: Self-pay | Admitting: Nurse Practitioner

## 2024-08-20 ENCOUNTER — Other Ambulatory Visit: Payer: Self-pay

## 2024-08-20 ENCOUNTER — Other Ambulatory Visit: Payer: Self-pay | Admitting: Nurse Practitioner

## 2024-08-20 DIAGNOSIS — E1165 Type 2 diabetes mellitus with hyperglycemia: Secondary | ICD-10-CM

## 2024-08-20 MED ORDER — LANCET DEVICE MISC
1.0000 | Freq: Three times a day (TID) | 0 refills | Status: AC
  Filled 2024-08-20: qty 1, 30d supply, fill #0

## 2024-08-20 MED ORDER — GLUCOSE BLOOD VI STRP
ORAL_STRIP | 12 refills | Status: AC
  Filled 2024-08-20: qty 100, 25d supply, fill #0

## 2024-08-20 MED ORDER — BLOOD GLUCOSE MONITOR SYSTEM W/DEVICE KIT
1.0000 | PACK | Freq: Three times a day (TID) | 0 refills | Status: AC
  Filled 2024-08-20: qty 1, 30d supply, fill #0

## 2024-08-20 MED ORDER — LANCETS MISC
1.0000 | 0 refills | Status: AC
  Filled 2024-08-20: qty 100, 25d supply, fill #0

## 2024-08-20 MED ORDER — BLOOD GLUCOSE TEST VI STRP
1.0000 | ORAL_STRIP | Freq: Three times a day (TID) | 0 refills | Status: AC
  Filled 2024-08-20: qty 100, 34d supply, fill #0

## 2024-08-20 NOTE — Telephone Encounter (Signed)
 Requesting a prescription for one touch dm testing strips. Please send to [facility] pharmacy. Pt states he use to have them but ran out

## 2024-08-22 ENCOUNTER — Other Ambulatory Visit: Payer: Self-pay

## 2024-08-22 MED ORDER — CARVEDILOL 12.5 MG PO TABS
12.5000 mg | ORAL_TABLET | Freq: Two times a day (BID) | ORAL | 7 refills | Status: AC
  Filled 2024-08-22: qty 90, 45d supply, fill #0
  Filled 2024-10-07: qty 90, 45d supply, fill #1
  Filled 2024-11-21: qty 90, 45d supply, fill #2

## 2024-09-10 ENCOUNTER — Other Ambulatory Visit: Payer: Self-pay

## 2024-09-10 ENCOUNTER — Other Ambulatory Visit: Payer: Self-pay | Admitting: Nurse Practitioner

## 2024-09-10 DIAGNOSIS — E11618 Type 2 diabetes mellitus with other diabetic arthropathy: Secondary | ICD-10-CM

## 2024-09-11 LAB — OPHTHALMOLOGY REPORT-SCANNED

## 2024-09-12 ENCOUNTER — Other Ambulatory Visit: Payer: Self-pay

## 2024-09-12 DIAGNOSIS — E119 Type 2 diabetes mellitus without complications: Secondary | ICD-10-CM

## 2024-09-12 MED ORDER — EMPAGLIFLOZIN 25 MG PO TABS
25.0000 mg | ORAL_TABLET | Freq: Every day | ORAL | 0 refills | Status: AC
  Filled 2024-09-12: qty 90, 90d supply, fill #0

## 2024-09-16 ENCOUNTER — Other Ambulatory Visit: Payer: Self-pay

## 2024-09-16 MED ORDER — SACUBITRIL-VALSARTAN 24-26 MG PO TABS
1.0000 | ORAL_TABLET | Freq: Two times a day (BID) | ORAL | 3 refills | Status: AC
  Filled 2024-09-16: qty 180, 90d supply, fill #0

## 2024-09-18 ENCOUNTER — Other Ambulatory Visit: Payer: Self-pay

## 2024-09-30 ENCOUNTER — Other Ambulatory Visit: Payer: Self-pay

## 2024-10-14 MED FILL — Atorvastatin Calcium Tab 40 MG (Base Equivalent): ORAL | 90 days supply | Qty: 90 | Fill #1 | Status: AC

## 2024-10-16 ENCOUNTER — Other Ambulatory Visit: Payer: Self-pay

## 2024-10-22 ENCOUNTER — Ambulatory Visit (INDEPENDENT_AMBULATORY_CARE_PROVIDER_SITE_OTHER)

## 2024-10-22 DIAGNOSIS — Z0000 Encounter for general adult medical examination without abnormal findings: Secondary | ICD-10-CM | POA: Diagnosis not present

## 2024-10-22 NOTE — Patient Instructions (Signed)
 Maurice Fischer,  Thank you for taking the time for your Medicare Wellness Visit. I appreciate your continued commitment to your health goals. Please review the care plan we discussed, and feel free to reach out if I can assist you further.  Please note that Annual Wellness Visits do not include a physical exam. Some assessments may be limited, especially if the visit was conducted virtually. If needed, we may recommend an in-person follow-up with your provider.  Ongoing Care Seeing your primary care provider every 3 to 6 months helps us  monitor your health and provide consistent, personalized care.   Referrals If a referral was made during today's visit and you haven't received any updates within two weeks, please contact the referred provider directly to check on the status.  Recommended Screenings:  Health Maintenance  Topic Date Due   Complete foot exam   04/02/2024   Yearly kidney health urinalysis for diabetes  08/03/2024   Medicare Annual Wellness Visit  09/10/2024   DTaP/Tdap/Td vaccine (2 - Tdap) 12/04/2024*   Colon Cancer Screening  12/04/2024*   Hemoglobin A1C  12/04/2024   COVID-19 Vaccine (7 - Mixed Product risk 2025-26 season) 01/05/2025   Yearly kidney function blood test for diabetes  06/03/2025   Eye exam for diabetics  09/11/2025   Pneumococcal Vaccine for age over 25  Completed   Flu Shot  Completed   Hepatitis C Screening  Completed   Zoster (Shingles) Vaccine  Completed   Meningitis B Vaccine  Aged Out  *Topic was postponed. The date shown is not the original due date.       10/22/2024    8:17 AM  Advanced Directives  Does Patient Have a Medical Advance Directive? No  Would patient like information on creating a medical advance directive? No - Patient declined    Vision: Annual vision screenings are recommended for early detection of glaucoma, cataracts, and diabetic retinopathy. These exams can also reveal signs of chronic conditions such as diabetes and  high blood pressure.  Dental: Annual dental screenings help detect early signs of oral cancer, gum disease, and other conditions linked to overall health, including heart disease and diabetes.  Please see the attached documents for additional preventive care recommendations.

## 2024-10-22 NOTE — Progress Notes (Signed)
 "  Chief Complaint  Patient presents with   Medicare Wellness     Subjective:   ADE STMARIE is a 73 y.o. male who presents for a Medicare Annual Wellness Visit.  Visit info / Clinical Intake: Medicare Wellness Visit Type:: Subsequent Annual Wellness Visit Persons participating in visit and providing information:: patient Medicare Wellness Visit Mode:: Telephone If telephone:: video declined Since this visit was completed virtually, some vitals may be partially provided or unavailable. Missing vitals are due to the limitations of the virtual format.: Unable to obtain vitals - no equipment If Telephone or Video please confirm:: I connected with patient using audio/video enable telemedicine. I verified patient identity with two identifiers, discussed telehealth limitations, and patient agreed to proceed. Patient Location:: home Provider Location:: office Interpreter Needed?: No Pre-visit prep was completed: yes AWV questionnaire completed by patient prior to visit?: no Living arrangements:: lives with spouse/significant other Patient's Overall Health Status Rating: good Typical amount of pain: some Does pain affect daily life?: no Are you currently prescribed opioids?: no  Dietary Habits and Nutritional Risks How many meals a day?: 2 Eats fruit and vegetables daily?: yes Most meals are obtained by: preparing own meals; eating out In the last 2 weeks, have you had any of the following?: none Diabetic:: (!) yes Any non-healing wounds?: no Would you like to be referred to a Nutritionist or for Diabetic Management? : no  Functional Status Activities of Daily Living (to include ambulation/medication): Independent Ambulation: Independent Medication Administration: Independent Home Management (perform basic housework or laundry): Independent Manage your own finances?: (!) no Primary transportation is: driving Concerns about vision?: no *vision screening is required for  WTM* Concerns about hearing?: no  Fall Screening Falls in the past year?: 0 Number of falls in past year: 0 Was there an injury with Fall?: 0 Fall Risk Category Calculator: 0 Patient Fall Risk Level: Low Fall Risk  Fall Risk Patient at Risk for Falls Due to: Medication side effect Fall risk Follow up: Falls prevention discussed; Falls evaluation completed  Home and Transportation Safety: All rugs have non-skid backing?: yes All stairs or steps have railings?: (!) no Grab bars in the bathtub or shower?: yes Have non-skid surface in bathtub or shower?: yes Good home lighting?: yes Regular seat belt use?: yes Hospital stays in the last year:: no  Cognitive Assessment Difficulty concentrating, remembering, or making decisions? : no Will 6CIT or Mini Cog be Completed: yes What year is it?: 0 points What month is it?: 0 points Give patient an address phrase to remember (5 components): [address] About what time is it?: 0 points Count backwards from 20 to 1: 0 points Say the months of the year in reverse: 0 points Repeat the address phrase from earlier: 0 points 6 CIT Score: 0 points  Advance Directives (For Healthcare) Does Patient Have a Medical Advance Directive?: No Would patient like information on creating a medical advance directive?: No - Patient declined  Reviewed/Updated  Reviewed/Updated: Reviewed All (Medical, Surgical, Family, Medications, Allergies, Care Teams, Patient Goals)    Allergies (verified) Patient has no known allergies.   Current Medications (verified) Outpatient Encounter Medications as of 10/22/2024  Medication Sig   aspirin  81 MG tablet Take 81 mg by mouth daily.   atorvastatin  (LIPITOR) 40 MG tablet TAKE 1 TABLET (40 MG TOTAL) BY MOUTH DAILY.   Blood Glucose Monitoring Suppl (BLOOD GLUCOSE MONITOR SYSTEM) w/Device KIT 1 each by Does not apply route in the morning, at  noon, and at bedtime. May substitute to any manufacturer covered by  patient's insurance.   carvedilol  (COREG ) 12.5 MG tablet Take 1 tablet (12.5 mg total) by mouth 2 (two) times daily with meals   Cholecalciferol (VITAMIN D3) 2000 units TABS Take 2,000 Units by mouth daily.   empagliflozin  (JARDIANCE ) 25 MG TABS tablet Take 1 tablet (25 mg total) by mouth daily before breakfast.   glucose blood test strip Use as instructed   Lancets MISC 1 each by Does not apply route as directed. Dispense based on patient and insurance preference. Use up to four times daily as directed. (FOR ICD-10 E10.9, E11.9).   metFORMIN  (GLUCOPHAGE ) 1000 MG tablet Take 1 tablet (1,000 mg total) by mouth 2 (two) times daily with a meal.   Multiple Vitamin (MULTIVITAMIN) tablet Take 1 tablet by mouth daily.   nitroGLYCERIN  (NITROSTAT ) 0.4 MG SL tablet Place 1 tablet (0.4 mg total) under the tongue every 5 (five) minutes x 3 doses as needed for chest pain.   sacubitril -valsartan  (ENTRESTO ) 24-26 MG Take 1 tablet by mouth every 12 (twelve) hours.   sacubitril -valsartan  (ENTRESTO ) 24-26 MG Take 1 tablet by mouth every 12 (twelve) hours.   sitaGLIPtin  (JANUVIA ) 100 MG tablet Take 1 tablet (100 mg total) by mouth daily.   spironolactone  (ALDACTONE ) 25 MG tablet Take 0.5 tablets (12.5 mg total) by mouth daily.   tetrahydrozoline-zinc (VISINE-AC) 0.05-0.25 % ophthalmic solution Place 2 drops into both eyes daily as needed (eye irritation).   No facility-administered encounter medications on file as of 10/22/2024.    History: Past Medical History:  Diagnosis Date   Diabetes mellitus without complication (HCC)    Diverticulosis 2004   Heart disease    Hyperlipidemia    Hypertension    Inguinal hernia 2015   right   Umbilical hernia 2015   Past Surgical History:  Procedure Laterality Date   blister cut open on right thumb   03/2017   COLONOSCOPY  2004   HERNIA REPAIR  1959?   HERNIA REPAIR  01/13/2014   umbilical hernia/ Dr Dellie   HERNIA REPAIR  01/13/2014   right inguinal hernia  repair/ Dr Dellie   LEFT HEART CATH AND CORONARY ANGIOGRAPHY N/A 06/17/2021   Procedure: LEFT HEART CATH AND CORONARY ANGIOGRAPHY with Possible Intervention;  Surgeon: Florencio Cara BIRCH, MD;  Location: [facility];  Service: Cardiovascular;  Laterality: N/A;   TONSILLECTOMY  1957?   Family History  Problem Relation Age of Onset   Cancer Father        prostate   Coronary artery disease Mother    Healthy Sister    Depression Brother    Social History   Occupational History   Occupation: Retired  Tobacco Use   Smoking status: Former    Current packs/day: 0.00    Average packs/day: 2.0 packs/day for 20.0 years (40.0 ttl pk-yrs)    Types: Cigarettes    Start date: 10/08/1972    Quit date: 10/08/1992    Years since quitting: 32.0   Smokeless tobacco: Never   Tobacco comments:    smoking cessation materials not required  Vaping Use   Vaping status: Never Used  Substance and Sexual Activity   Alcohol use: Yes    Alcohol/week: 7.0 standard drinks of alcohol    Types: 7 Standard drinks or equivalent per week   Drug use: No   Sexual activity: Not Currently   Tobacco Counseling Counseling given: Not Answered Tobacco comments: smoking cessation materials not required  SDOH Screenings   Food Insecurity: No Food Insecurity (10/22/2024)  Housing: Unknown (10/22/2024)  Transportation Needs: No Transportation Needs (10/22/2024)  Utilities: Not At Risk (10/22/2024)  Alcohol Screen: Low Risk (10/22/2024)  Depression (PHQ2-9): Low Risk (10/22/2024)  Financial Resource Strain: Low Risk (10/22/2024)  Physical Activity: Sufficiently Active (10/22/2024)  Social Connections: Socially Isolated (10/22/2024)  Stress: No Stress Concern Present (10/22/2024)  Tobacco Use: Medium Risk (10/22/2024)  Health Literacy: Adequate Health Literacy (10/22/2024)   See flowsheets for full screening details  Depression Screen PHQ 2 & 9 Depression Scale- Over the past 2 weeks, how often have  you been bothered by any of the following problems? Little interest or pleasure in doing things: 0 Feeling down, depressed, or hopeless (PHQ Adolescent also includes...irritable): 0 PHQ-2 Total Score: 0 Trouble falling or staying asleep, or sleeping too much: 0 Feeling tired or having little energy: 0 Poor appetite or overeating (PHQ Adolescent also includes...weight loss): 0 Feeling bad about yourself - or that you are a failure or have let yourself or your family down: 0 Trouble concentrating on things, such as reading the newspaper or watching television (PHQ Adolescent also includes...like school work): 0 Moving or speaking so slowly that other people could have noticed. Or the opposite - being so fidgety or restless that you have been moving around a lot more than usual: 0 Thoughts that you would be better off dead, or of hurting yourself in some way: 0 PHQ-9 Total Score: 0 If you checked off any problems, how difficult have these problems made it for you to do your work, take care of things at home, or get along with other people?: Not difficult at all  Depression Treatment Depression Interventions/Treatment : EYV7-0 Score <4 Follow-up Not Indicated     Goals Addressed             This Visit's Progress    Patient Stated       10/22/2024, stay alive             Objective:    Today's Vitals   There is no height or weight on file to calculate BMI.  Hearing/Vision screen Hearing Screening - Comments:: Denies hearing issues Vision Screening - Comments:: Regular eye exam Immunizations and Health Maintenance Health Maintenance  Topic Date Due   FOOT EXAM  04/02/2024   Diabetic kidney evaluation - Urine ACR  08/03/2024   DTaP/Tdap/Td (2 - Tdap) 12/04/2024 (Originally 03/05/2023)   Colonoscopy  12/04/2024 (Originally 01/08/2024)   HEMOGLOBIN A1C  12/04/2024   COVID-19 Vaccine (7 - Mixed Product risk 2025-26 season) 01/05/2025   Diabetic kidney evaluation - eGFR  measurement  06/03/2025   OPHTHALMOLOGY EXAM  09/11/2025   Medicare Annual Wellness (AWV)  10/22/2025   Pneumococcal Vaccine: 50+ Years  Completed   Influenza Vaccine  Completed   Hepatitis C Screening  Completed   Zoster Vaccines- Shingrix  Completed   Meningococcal B Vaccine  Aged Out        Assessment/Plan:  This is a routine wellness examination for Naethan.  Patient Care Team: Gareth Mliss FALCON, FNP as PCP - General (Nurse Practitioner) Florencio Cara BIRCH, MD as Consulting Physician (Cardiology) Dingeldein, Elspeth, MD (Ophthalmology)  I have personally reviewed and noted the following in the patients chart:   Medical and social history Use of alcohol, tobacco or illicit drugs  Current medications and supplements including opioid prescriptions. Functional ability and status Nutritional status Physical activity Advanced directives List of other physicians Hospitalizations, surgeries, and ER visits  in previous 12 months Vitals Screenings to include cognitive, depression, and falls Referrals and appointments  No orders of the defined types were placed in this encounter.  In addition, I have reviewed and discussed with patient certain preventive protocols, quality metrics, and best practice recommendations. A written personalized care plan for preventive services as well as general preventive health recommendations were provided to patient.   Ardella FORBES Dawn, LPN   [id number]   Return in 1 year (on 10/22/2025).  After Visit Summary: (In Person-Declined) Patient declined AVS at this time.  Nurse Notes: HM Addressed: Diabetic Foot Exam recommended Labs Due UACR  "

## 2024-12-04 ENCOUNTER — Ambulatory Visit: Admitting: Nurse Practitioner
